# Patient Record
Sex: Female | Born: 1935 | Race: White | Hispanic: No | Marital: Single | Smoking: Never smoker
Health system: Southern US, Community
[De-identification: ages and names within clinical notes are randomized; demographics above are authoritative.]

## PROBLEM LIST (undated history)

## (undated) DIAGNOSIS — Z8719 Personal history of other diseases of the digestive system: Secondary | ICD-10-CM

## (undated) DIAGNOSIS — K5730 Diverticulosis of large intestine without perforation or abscess without bleeding: Secondary | ICD-10-CM

## (undated) DIAGNOSIS — K2990 Gastroduodenitis, unspecified, without bleeding: Secondary | ICD-10-CM

## (undated) DIAGNOSIS — M7120 Synovial cyst of popliteal space [Baker], unspecified knee: Secondary | ICD-10-CM

## (undated) DIAGNOSIS — G250 Essential tremor: Secondary | ICD-10-CM

## (undated) DIAGNOSIS — I2510 Atherosclerotic heart disease of native coronary artery without angina pectoris: Secondary | ICD-10-CM

## (undated) DIAGNOSIS — I10 Essential (primary) hypertension: Secondary | ICD-10-CM

## (undated) DIAGNOSIS — G459 Transient cerebral ischemic attack, unspecified: Secondary | ICD-10-CM

## (undated) DIAGNOSIS — K222 Esophageal obstruction: Secondary | ICD-10-CM

## (undated) DIAGNOSIS — K559 Vascular disorder of intestine, unspecified: Secondary | ICD-10-CM

## (undated) DIAGNOSIS — C541 Malignant neoplasm of endometrium: Secondary | ICD-10-CM

## (undated) DIAGNOSIS — R897 Abnormal histological findings in specimens from other organs, systems and tissues: Secondary | ICD-10-CM

## (undated) DIAGNOSIS — C44311 Basal cell carcinoma of skin of nose: Secondary | ICD-10-CM

## (undated) DIAGNOSIS — E7800 Pure hypercholesterolemia, unspecified: Secondary | ICD-10-CM

## (undated) DIAGNOSIS — Z87442 Personal history of urinary calculi: Secondary | ICD-10-CM

## (undated) DIAGNOSIS — I8390 Asymptomatic varicose veins of unspecified lower extremity: Secondary | ICD-10-CM

## (undated) DIAGNOSIS — E669 Obesity, unspecified: Secondary | ICD-10-CM

## (undated) DIAGNOSIS — Q999 Chromosomal abnormality, unspecified: Secondary | ICD-10-CM

## (undated) DIAGNOSIS — M1990 Unspecified osteoarthritis, unspecified site: Secondary | ICD-10-CM

## (undated) HISTORY — DX: Esophageal obstruction: K22.2

## (undated) HISTORY — PX: COLONOSCOPY: SHX174

## (undated) HISTORY — DX: Pure hypercholesterolemia, unspecified: E78.00

## (undated) HISTORY — DX: Basal cell carcinoma of skin of nose: C44.311

## (undated) HISTORY — DX: Vascular disorder of intestine, unspecified: K55.9

## (undated) HISTORY — PX: CATARACT EXTRACTION W/ INTRAOCULAR LENS  IMPLANT, BILATERAL: SHX1307

## (undated) HISTORY — DX: Personal history of urinary calculi: Z87.442

## (undated) HISTORY — PX: SKIN BIOPSY: SHX1

## (undated) HISTORY — PX: WISDOM TOOTH EXTRACTION: SHX21

## (undated) HISTORY — DX: Transient cerebral ischemic attack, unspecified: G45.9

## (undated) HISTORY — PX: CHOLECYSTECTOMY: SHX55

## (undated) HISTORY — DX: Chromosomal abnormality, unspecified: Q99.9

## (undated) HISTORY — DX: Obesity, unspecified: E66.9

## (undated) HISTORY — DX: Abnormal histological findings in specimens from other organs, systems and tissues: R89.7

## (undated) HISTORY — DX: Essential tremor: G25.0

## (undated) HISTORY — PX: EYE SURGERY: SHX253

## (undated) HISTORY — DX: Personal history of other diseases of the digestive system: Z87.19

## (undated) HISTORY — DX: Atherosclerotic heart disease of native coronary artery without angina pectoris: I25.10

## (undated) HISTORY — DX: Unspecified osteoarthritis, unspecified site: M19.90

---
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
## 1996-04-10 HISTORY — PX: LUMBAR DISC SURGERY: SHX700

## 1999-03-21 ENCOUNTER — Encounter: Payer: Self-pay | Admitting: Otolaryngology

## 1999-03-21 ENCOUNTER — Encounter: Admission: RE | Admit: 1999-03-21 | Discharge: 1999-03-21 | Payer: Self-pay | Admitting: Otolaryngology

## 1999-03-22 ENCOUNTER — Ambulatory Visit (HOSPITAL_BASED_OUTPATIENT_CLINIC_OR_DEPARTMENT_OTHER): Admission: RE | Admit: 1999-03-22 | Discharge: 1999-03-22 | Payer: Self-pay | Admitting: Otolaryngology

## 1999-08-19 ENCOUNTER — Encounter: Payer: Self-pay | Admitting: Gastroenterology

## 1999-08-19 ENCOUNTER — Ambulatory Visit (HOSPITAL_COMMUNITY): Admission: RE | Admit: 1999-08-19 | Discharge: 1999-08-19 | Payer: Self-pay | Admitting: Gastroenterology

## 1999-08-25 ENCOUNTER — Encounter (INDEPENDENT_AMBULATORY_CARE_PROVIDER_SITE_OTHER): Payer: Self-pay | Admitting: Specialist

## 1999-08-25 ENCOUNTER — Ambulatory Visit (HOSPITAL_COMMUNITY): Admission: RE | Admit: 1999-08-25 | Discharge: 1999-08-25 | Payer: Self-pay | Admitting: Gastroenterology

## 2000-04-10 HISTORY — PX: CHOLECYSTECTOMY, LAPAROSCOPIC: SHX56

## 2001-01-22 ENCOUNTER — Ambulatory Visit (HOSPITAL_COMMUNITY): Admission: RE | Admit: 2001-01-22 | Discharge: 2001-01-22 | Payer: Self-pay | Admitting: Internal Medicine

## 2001-01-22 ENCOUNTER — Encounter: Payer: Self-pay | Admitting: Internal Medicine

## 2001-02-08 ENCOUNTER — Observation Stay (HOSPITAL_COMMUNITY): Admission: RE | Admit: 2001-02-08 | Discharge: 2001-02-09 | Payer: Self-pay | Admitting: *Deleted

## 2001-02-08 ENCOUNTER — Encounter (INDEPENDENT_AMBULATORY_CARE_PROVIDER_SITE_OTHER): Payer: Self-pay | Admitting: Specialist

## 2001-04-10 HISTORY — PX: TENDON GRAFT: SHX2486

## 2001-04-10 HISTORY — PX: ROTATOR CUFF REPAIR: SHX139

## 2001-06-15 ENCOUNTER — Emergency Department (HOSPITAL_COMMUNITY): Admission: EM | Admit: 2001-06-15 | Discharge: 2001-06-15 | Payer: Self-pay | Admitting: Emergency Medicine

## 2001-06-15 ENCOUNTER — Encounter: Payer: Self-pay | Admitting: Emergency Medicine

## 2001-09-13 ENCOUNTER — Inpatient Hospital Stay (HOSPITAL_COMMUNITY): Admission: RE | Admit: 2001-09-13 | Discharge: 2001-09-15 | Payer: Self-pay | Admitting: Orthopedic Surgery

## 2003-03-12 ENCOUNTER — Ambulatory Visit (HOSPITAL_COMMUNITY): Admission: RE | Admit: 2003-03-12 | Discharge: 2003-03-12 | Payer: Self-pay | Admitting: Gastroenterology

## 2003-04-11 HISTORY — PX: CATARACT EXTRACTION: SUR2

## 2003-07-23 ENCOUNTER — Encounter (INDEPENDENT_AMBULATORY_CARE_PROVIDER_SITE_OTHER): Payer: Self-pay | Admitting: *Deleted

## 2003-07-23 ENCOUNTER — Ambulatory Visit (HOSPITAL_COMMUNITY): Admission: RE | Admit: 2003-07-23 | Discharge: 2003-07-23 | Payer: Self-pay | Admitting: Otolaryngology

## 2006-04-19 ENCOUNTER — Ambulatory Visit (HOSPITAL_COMMUNITY): Admission: RE | Admit: 2006-04-19 | Discharge: 2006-04-19 | Payer: Self-pay | Admitting: Internal Medicine

## 2007-06-13 ENCOUNTER — Ambulatory Visit: Payer: Self-pay | Admitting: Gastroenterology

## 2007-07-03 ENCOUNTER — Ambulatory Visit: Payer: Self-pay | Admitting: Gastroenterology

## 2007-07-23 ENCOUNTER — Ambulatory Visit: Payer: Self-pay | Admitting: Gastroenterology

## 2007-09-09 ENCOUNTER — Encounter: Payer: Self-pay | Admitting: Gastroenterology

## 2009-03-03 ENCOUNTER — Ambulatory Visit (HOSPITAL_COMMUNITY): Admission: RE | Admit: 2009-03-03 | Discharge: 2009-03-04 | Payer: Self-pay | Admitting: Orthopedic Surgery

## 2009-04-13 ENCOUNTER — Encounter: Admission: RE | Admit: 2009-04-13 | Discharge: 2009-07-12 | Payer: Self-pay | Admitting: Orthopedic Surgery

## 2009-06-23 ENCOUNTER — Ambulatory Visit: Payer: Self-pay | Admitting: Vascular Surgery

## 2009-12-27 ENCOUNTER — Emergency Department (HOSPITAL_COMMUNITY): Admission: EM | Admit: 2009-12-27 | Discharge: 2009-12-27 | Payer: Self-pay | Admitting: Emergency Medicine

## 2009-12-27 ENCOUNTER — Encounter (INDEPENDENT_AMBULATORY_CARE_PROVIDER_SITE_OTHER): Payer: Self-pay | Admitting: Emergency Medicine

## 2009-12-27 ENCOUNTER — Ambulatory Visit: Payer: Self-pay | Admitting: Surgery

## 2010-06-23 LAB — DIFFERENTIAL
Basophils Absolute: 0 10*3/uL (ref 0.0–0.1)
Basophils Relative: 0 % (ref 0–1)
Eosinophils Absolute: 0.1 10*3/uL (ref 0.0–0.7)
Eosinophils Relative: 1 % (ref 0–5)
Lymphocytes Relative: 24 % (ref 12–46)
Lymphs Abs: 1.4 10*3/uL (ref 0.7–4.0)
Monocytes Absolute: 0.5 10*3/uL (ref 0.1–1.0)
Monocytes Relative: 9 % (ref 3–12)
Neutro Abs: 3.9 10*3/uL (ref 1.7–7.7)
Neutrophils Relative %: 66 % (ref 43–77)

## 2010-06-23 LAB — POCT I-STAT, CHEM 8
BUN: 27 mg/dL — ABNORMAL HIGH (ref 6–23)
Calcium, Ion: 1.22 mmol/L (ref 1.12–1.32)
Chloride: 108 mEq/L (ref 96–112)
Creatinine, Ser: 1.1 mg/dL (ref 0.4–1.2)
Glucose, Bld: 86 mg/dL (ref 70–99)
HCT: 38 % (ref 36.0–46.0)
Hemoglobin: 12.9 g/dL (ref 12.0–15.0)
Potassium: 4.4 mEq/L (ref 3.5–5.1)
Sodium: 140 mEq/L (ref 135–145)
TCO2: 25 mmol/L (ref 0–100)

## 2010-06-23 LAB — CBC
HCT: 35.4 % — ABNORMAL LOW (ref 36.0–46.0)
Hemoglobin: 12 g/dL (ref 12.0–15.0)
MCH: 30.6 pg (ref 26.0–34.0)
MCHC: 34 g/dL (ref 30.0–36.0)
MCV: 89.9 fL (ref 78.0–100.0)
Platelets: 224 10*3/uL (ref 150–400)
RBC: 3.94 MIL/uL (ref 3.87–5.11)
RDW: 13.2 % (ref 11.5–15.5)
WBC: 5.9 10*3/uL (ref 4.0–10.5)

## 2010-06-23 LAB — COMPREHENSIVE METABOLIC PANEL
ALT: 12 U/L (ref 0–35)
AST: 20 U/L (ref 0–37)
Albumin: 4 g/dL (ref 3.5–5.2)
Alkaline Phosphatase: 49 U/L (ref 39–117)
BUN: 24 mg/dL — ABNORMAL HIGH (ref 6–23)
CO2: 26 mEq/L (ref 19–32)
Calcium: 9.6 mg/dL (ref 8.4–10.5)
Chloride: 108 mEq/L (ref 96–112)
Creatinine, Ser: 1.15 mg/dL (ref 0.4–1.2)
GFR calc Af Amer: 56 mL/min — ABNORMAL LOW (ref >60)
GFR calc non Af Amer: 46 mL/min — ABNORMAL LOW (ref >60)
Glucose, Bld: 87 mg/dL (ref 70–99)
Potassium: 4.4 mEq/L (ref 3.5–5.1)
Sodium: 140 mEq/L (ref 135–145)
Total Bilirubin: 0.6 mg/dL (ref 0.3–1.2)
Total Protein: 7.1 g/dL (ref 6.0–8.3)

## 2010-07-13 LAB — DIFFERENTIAL
Basophils Absolute: 0 10*3/uL (ref 0.0–0.1)
Basophils Relative: 1 % (ref 0–1)
Eosinophils Absolute: 0.1 10*3/uL (ref 0.0–0.7)
Eosinophils Relative: 2 % (ref 0–5)
Lymphocytes Relative: 32 % (ref 12–46)
Lymphs Abs: 1.7 10*3/uL (ref 0.7–4.0)
Monocytes Absolute: 0.5 10*3/uL (ref 0.1–1.0)
Monocytes Relative: 10 % (ref 3–12)
Neutro Abs: 2.9 10*3/uL (ref 1.7–7.7)
Neutrophils Relative %: 56 % (ref 43–77)

## 2010-07-13 LAB — URINALYSIS, ROUTINE W REFLEX MICROSCOPIC
Bilirubin Urine: NEGATIVE
Glucose, UA: NEGATIVE mg/dL
Hgb urine dipstick: NEGATIVE
Ketones, ur: NEGATIVE mg/dL
Nitrite: NEGATIVE
Protein, ur: NEGATIVE mg/dL
Specific Gravity, Urine: 1.022 (ref 1.005–1.030)
Urobilinogen, UA: 0.2 mg/dL (ref 0.0–1.0)
pH: 5.5 (ref 5.0–8.0)

## 2010-07-13 LAB — PROTIME-INR
INR: 1 (ref 0.00–1.49)
Prothrombin Time: 13.1 seconds (ref 11.6–15.2)

## 2010-07-13 LAB — COMPREHENSIVE METABOLIC PANEL
ALT: 15 U/L (ref 0–35)
AST: 20 U/L (ref 0–37)
Albumin: 3.8 g/dL (ref 3.5–5.2)
Alkaline Phosphatase: 53 U/L (ref 39–117)
BUN: 30 mg/dL — ABNORMAL HIGH (ref 6–23)
CO2: 29 mEq/L (ref 19–32)
Calcium: 9.9 mg/dL (ref 8.4–10.5)
Chloride: 104 mEq/L (ref 96–112)
Creatinine, Ser: 1.26 mg/dL — ABNORMAL HIGH (ref 0.4–1.2)
GFR calc Af Amer: 50 mL/min — ABNORMAL LOW (ref >60)
GFR calc non Af Amer: 42 mL/min — ABNORMAL LOW (ref >60)
Glucose, Bld: 84 mg/dL (ref 70–99)
Potassium: 4.9 mEq/L (ref 3.5–5.1)
Sodium: 140 mEq/L (ref 135–145)
Total Bilirubin: 0.8 mg/dL (ref 0.3–1.2)
Total Protein: 7.5 g/dL (ref 6.0–8.3)

## 2010-07-13 LAB — APTT: aPTT: 29 seconds (ref 24–37)

## 2010-07-13 LAB — CBC
HCT: 40.8 % (ref 36.0–46.0)
Hemoglobin: 13.6 g/dL (ref 12.0–15.0)
MCHC: 33.3 g/dL (ref 30.0–36.0)
MCV: 90.9 fL (ref 78.0–100.0)
Platelets: 228 10*3/uL (ref 150–400)
RBC: 4.48 MIL/uL (ref 3.87–5.11)
RDW: 13.2 % (ref 11.5–15.5)
WBC: 5.2 10*3/uL (ref 4.0–10.5)

## 2010-07-13 LAB — URINE MICROSCOPIC-ADD ON

## 2010-08-23 NOTE — Procedures (Signed)
LOWER EXTREMITY VENOUS REFLUX EXAM   INDICATION:  Bilateral swelling, aching and bulging varicosities.   EXAM:  Using color-flow imaging and pulse Doppler spectral analysis,  bilateral common femoral, superficial femoral, popliteal, posterior  tibial, greater and lesser saphenous veins were evaluated.  There is  evidence suggesting deep venous insufficiency in bilateral lower  extremities.   The left saphenofemoral junction is not competent with reflux of >500  milliseconds. The left great saphenous vein is not competent with reflux  of  >500 milliseconds.   The bilateral proximal short saphenous veins demonstrate competency.   GSV Diameter (used if found to be incompetent only)                                            Right    Left  Proximal Greater Saphenous Vein           cm       0.96 cm  Proximal-to-mid-thigh                     cm       0.70 cm  Mid thigh                                 cm       0.55 cm  Mid-distal thigh                          cm       cm  Distal thigh                              cm       0.54 cm  Knee                                      cm       0.70 cm   IMPRESSION:  1. The left great saphenous vein reflux of >500 milliseconds is      identified with the caliber ranging from 0.70 cm to 0.96 cm knee to      groin.  2. The bilateral great saphenous veins are not aneurysmal.  3. Bilateral great saphenous veins are not tortuous.  4. The deep venous system is not competent with reflux of  >500      milliseconds.  5. Bilateral lesser saphenous veins are competent.  6. Bilateral gastrocnemius veins are incompetent with reflux of >500      milliseconds.  7. Incompetent perforator veins are identified and contributing to      varicosities, right gastroc incompetent perforator measuring 0.31      cm, left great saphenous vein mid calf incompetent perforator      measuring 0.25 cm, left gastroc incompetent perforator measuring      0.23  cm.          ___________________________________________  Rosetta Posner, M.D.   CJ/MEDQ  D:  06/23/2009  T:  06/23/2009  Job:  [id number]

## 2010-08-23 NOTE — Consult Note (Signed)
NEW PATIENT CONSULTATION   [name], [name]  DOB:  [birth year]                                       06/23/2009  [id number]   The patient presents today for left leg venous pathology.  She is a very  pleasant 77 year old white female who presents with progressive changes  of venous hypertension in her left leg and to a much lesser degree her  right leg.  She does not have any history of DVT.  She reports heaviness  and cramping over the area of the varicosities over her medial aspect of  her left calf.  She has not had any prior bleeding from these.  She does  not have any skin ulcerations.  She has not worn compression garments.  She does elevate her legs for the treatment of the pressure.  She has no  history of arterial insufficiency.   PAST MEDICAL HISTORY:  Her past history is significant for elevated  blood pressure and elevated cholesterol which are both under medical  control.  She does have a history of laparoscopic cholecystectomy in  2002, lumbar disk surgery in 2009, eye surgery 2008 and 2005 and rotator  cuff tendon graft in 2010.   FAMILY HISTORY:  Significant for premature death with cardiovascular  disease in a brother, otherwise negative.   SOCIAL HISTORY:  She is single.  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  She does not smoke or drink alcohol.   REVIEW OF SYSTEMS:  Negative for weight loss or weight gain.  Her weight  is 155 pounds.  She is 5 feet 2 inches tall.  CARDIAC:  Positive only for arrhythmias related to certain medications.  PULMONARY:  Negative.  GI:  Positive for hiatal hernia, reflux, constipation.  GU:  Notable for cyst on both kidneys.  VASCULAR:  Significant for prior transient ischemic attack.  NEUROLOGICAL:  Negative otherwise.  MUSCULOSKELETAL:  Positive for osteoarthritis, joint pain, muscle pain.  PSYCHIATRIC:  Negative for depression or anxiety.  HEENT:  Positive for change in her hearing.  HEMATOLOGIC:  Negative for bleeding problems.  SKIN:  Without ulcers or rashes.   PHYSICAL EXAM:  General:  A well-developed, well-nourished white female  appearing her stated age.  Vital signs:  Blood pressure is 146/80, pulse  82, respirations 18.  HEENT:  Normal.  Musculoskeletal:  Shows no major  deformities or cyanosis.  Neurological:  Without focal weakness or  paresthesias.  Skin:  Without ulcers or rashes.  She does have 2+  radial, 2+ dorsalis pedis pulses bilaterally.  She has a few scattered  telangiectasia and small reticular varicosities in her right leg.  Over  her left leg she has a very large plexus of varicosities extending from  her medial knee across her calf.  She also has marked areas of patches  of telangiectasia around her ankles and distal calf.   She underwent noninvasive vascular laboratory studies in our office  today and I reviewed this with her.  Also reviewed notes from Dr.  Jacquiline Doe office.  Her noninvasive studies show mild deep venous reflux  bilaterally.  She does have no significant reflux in her right saphenous  vein great or small.  On the left she has marked reflux throughout her  left great saphenous vein extending into the varicosities in her calf  but  no reflux in the small saphenous.   I had a long discussion with the patient explaining the significance of  this.  I explained that the indications for treatment would be pain or  concern regarding appearance.  She is not concerned regarding the  appearance and does report that the pain is tolerable.  She was  reassured that this will be slowly progressive and if it does come to a  point where she is not willing to tolerated the level of discomfort this  could be treated.  I explained the option of compression garments.  She  has deferred this for now.  I also explained the potential option of  laser ablation of her left great saphenous vein and stab phlebectomy of  her tributary varicosities for  relief if conservative methods fail.  She  understands and will notify us should she wish to consider intervention  in the future.     Rosetta Posner, M.D.  Electronically Signed   TFE/MEDQ  D:  06/23/2009  T:  06/24/2009  Job:  [id number]

## 2010-08-23 NOTE — Assessment & Plan Note (Signed)
[place] OFFICE NOTE   NAME:Korell, ISHMEET CABACUNGAN                        MRN:          [id number]  DATE:07/23/2007                            DOB:          Aug 31, 1932    Mrs. Trejos underwent colonoscopy on July 03, 2007, and was found to have  rather severe diverticulosis, subacute diverticulitis, and a stricture  of her rectosigmoid that required some dilatation with pediatric  colonoscope.  She was treated with metronidazole and Flagyl,  is  currently asymptomatic and is following a high-fiber diet and is taking  fiber supplements and liberal p.o. fluids.   She also had endoscopic exam and had an esophageal stricture that was  partially dilated with the endoscope.  She refused full dilation with  [facility] dilators because of fear of perforation.  Additionally, she had  a 5 cm hiatal hernia and had nonspecific gastritis with a negative CLO  biopsy for Helicobacter pylori.  She currently is on Nexium 40 mg a day  and denies acid reflux or significant dysphagia.   VITAL SIGNS:  All stable.  General physical exam is not repeated.   ASSESSMENT:  Mrs. Fingar is doing well with her diverticulosis and acid  reflux disease.  She is at high risk for diverticulosis, and I have  asked her to be careful about her diet and take plenty of p.o. fluid  supplementation.   RECOMMENDATIONS:  1. High-fiber diet with Benefiber 1 tablespoon in juice every morning.  2. Continue reflux regime and daily Nexium therapy.  3. Continue other medications as per Dr. Reynaldo Minium which include Diovan      80 mg  every two to three days.  4. P.r.n. GI follow up as needed.     Loralee Pacas. Sharlett Iles, MD, Quentin Ore, [place]  Electronically Signed    DRP/MedQ  DD: 07/23/2007  DT: 07/23/2007  Job #: [id number]   cc:   Burnard Bunting, M.D.

## 2010-08-23 NOTE — Assessment & Plan Note (Signed)
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  She also has chronic GERD.  She had previous  endoscopies many years ago, and is currently having reflux symptoms,  especially at nighttime.  She is referred by Dr. Reynaldo Minium for possible  endoscopy and colonoscopy.   [name] does well with her acid reflux unless she uses carbonated  drinks.  She is status post cholecystectomy, denies any hepatobiliary  complaints.  Appetite is good and her weight is stable.  She has  irritable bowel syndrome with some gas, bloating, and irregular bowel  habits, but denies melena or hematochezia.  She has not had endoscopic  exams in many years.  Her family history is remarkable for colon cancer  in an uncle, an aunt, and a sister.  She denies specific food  intolerances, anorexia or weight loss.  She did have recent ultrasound  of the abdomen performed in February of this year because of her vague  abdominal pain, and this was unremarkable.   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  They have been excised by Dr. Minna Merritts.  She suffers from essential hypertension, hypercholesterolemia, and mild  degenerative arthritis.  Her cholecystectomy was performed in 2002.  She  also has chronic disk disease of her low back, and has chronic back  pain, but denies abuse of NSAIDs.   MEDICATIONS:  Diovan 80 mg 1 every 3 days.   She has had reactions in the past to ASPIRIN, VIOXX, PENICILLIN, and  SULFA.   FAMILY HISTORY:   Besides the above-mentioned colon carcinoma in several  members of their family, history is remarkable for atherosclerotic  cardiovascular disease and diabetes.   SOCIAL HISTORY:  The patient is single, has never been married, and  lives with her sister.  She has a high school education, has been  retired for 10 years.  She does not smoke or abuse ethanol.   REVIEW OF SYSTEMS:  Fairly noncontributory without active  cardiovascular, pulmonary, or other general medical problems.   Recent laboratory data:  She had a normal CBC with a hemoglobin of 13.0  and a normal metabolic profile.   PHYSICAL EXAMINATION:  She is a healthy-appearing white female,  appearing younger than her stated age, in no acute distress.  She is 5 feet 3 inches and weighs 176 pounds.  Blood pressure is 138/78,  and pulse was 72 and regular.  I could not appreciate stigmata of chronic liver disease or thyromegaly.  Her chest was entirely clear and she was in a regular rhythm without  murmurs, gallops, or rubs.  Her abdomen showed no organomegaly,  masses, or tenderness.  Bowel sounds  were normal.  Peripheral extremities were unremarkable.  Mental status was clear.  Rectal exam was deferred.   ASSESSMENT:  1. Strong family history of colon cancer in a 77 year old white female      who has not had colonoscopy in approximately 10 years.  She has      chronic irritable bowel syndrome, but no history of rectal bleeding      or anemia.  2. Status post cholecystectomy for cholelithiasis.  3. Chronic gastroesophageal reflux disease, managed by dietary      therapy.  4. Mild essential hypertension.  5. History of hypercholesterolemia, managed by dietary therapy.  6. History of TIA several years ago without overt stroke.  7. History of multiple basal and squamous cell carcinomas of the face,      excised.  8. History of PENICILLIN and SULFA allergies.  9. History of NON-STEROIDAL ANTI-INFLAMMATORY DRUG sensitivity.   10.History of cataract surgery in 2005.   RECOMMENDATIONS:  1. Reflux regimen, reviewed with patient and will try a Kapidex 60 mg      30 minutes before breakfast.  2. Continue high-fiber diet as tolerated.  3. Outpatient endoscopy and colonoscopy at her convenience.  4. Continue on the medications per Dr. Reynaldo Minium.     Loralee Pacas. Sharlett Iles, MD, Quentin Ore, [place]  Electronically Signed    DRP/MedQ  DD: 06/13/2007  DT: 06/13/2007  Job #: [id number]   cc:   Burnard Bunting, M.D.

## 2010-08-26 NOTE — Op Note (Signed)
[facility]  Patient:    [name]                         MRN: [id number] Proc. Date: 03/22/99 Adm. Date:  [id number] Attending:  Shirlyn Goltz CC:         Orlando Penner. London Pepper, M.D.             Dr. 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  POSTOPERATIVE DIAGNOSIS:  Basal cell carcinoma of the nasal dorsum.  OPERATION:  Excision of basal cell carcinoma with advancement flap reconstruction. Size of lesion was 1.3 x 1.1 cm.  SURGEON:  Thornell Sartorius, M.D.  ANESTHESIA:  Local with general standby.  MAC.  ANESTHESIOLOGIST:  Ala Dach, M.D.  DESCRIPTION OF PROCEDURE:  The patient was placed in the supine position under local anesthesia.  She was prepped and draped in the appropriate manner using Hibiclens x 3 and the usual head drape.  Then a marking pen was used.  Then 1% Xylocaine with epinephrine was injected appropriately.  The lesion was excised in a somewhat circular manner appropriately gearing towards the flap that was previously done as we would have to look under the flap for further evaluation f the basal cell, and then we will advance this flap, which appears to be somewhat full and bulky, in an effort to correct this present lesion.  The incisions made make it very difficult to develop a new flap.  Once the lesion was removed totally and all electrocoagulation was used for all hemostasis, then all instruments were set aside where that was used, and the new flap was established.  We made incisions encompassing the previous flap in some areas in an effort to save vascular flow to this flap.  Then we advanced the flap from the nasolabial region, and advanced t over to correct and cover the nasal defect.  Once all hemostasis was established, the flap was estimated in length, approximately 3 cm x 2 cm in diameter, and this was moved into  place without stress.  Closure was with chromic catgut 4-0 and then 5-0 Ethilon was used to close this flap.  Steri-Strips were applied.  All hemostasis was established.  A Band-Aid was placed over this also, and the patient was taken to the recovery room in good condition.  Follow up will be in one week, three weeks, six weeks, six months, and then a year. DD:  03/22/99 TD:  03/22/99 Job: [id number]

## 2010-08-26 NOTE — H&P (Signed)
[facility]  Patient:    [name], [name] Visit Number: [id number] MRN: [id number]          Service Type: SUR Location: 1S 0001 01 Attending Physician:  [name] Dictated by:   [name], P.A. Admit Date:  09/13/2001                           History and Physical  CHIEF COMPLAINT:  Right shoulder pain.  HISTORY OF PRESENT ILLNESS:  The patient is a 77 year old white female who has been complaining of pain in her right shoulder after lifting her mother.  She has painful and limited range of motion of her right shoulder.  She is unable to reach behind her back.  She can abduct and full reflex her arm, but she is unable to hold against any pressure of her right shoulder.  She has talked with Dr. Gladstone [name] extensively about surgery and wishes to proceed with closed manipulation and open rotator cuff repair.  PAST MEDICAL HISTORY: 1. Gastroesophageal reflux disease. 2. Duodenal ulcers. 3. Hiatal hernia. 4. Kidney stones. 5. Basal cell carcinoma x 4 on her nose. 6. Osteoarthritis.  PAST SURGICAL HISTORY: 1. Cholecystectomy. 2. Tonsillectomy. 3. Lumbar disk surgery. 4. Flap on her nose from the basal cell carcinoma.  MEDICATIONS:  Aleve 200 mg p.r.n.  ALLERGIES:  PENICILLIN, SULFA, MEDROL DOSEPAK gives her rapid heartbeat and vomits.  SOCIAL HISTORY:  She is single, retired.  Lives in a one-level home.  She lives with her sister, and her sisters will be her caregivers after surgery.  FAMILY HISTORY:  Father, coronary artery disease, deceased.  Mother, 52 years old, with osteoarthritis, still living.  Sister, diabetes mellitus and hypertension.  Brothers, diabetes mellitus.  Uncle had colon and rectal cancer.  Different sister is deceased, also had colon and rectal cancer.  REVIEW OF SYSTEMS:  GENERAL:  Denies fevers, chills, night sweats, bleeding tendencies.  CENTRAL NERVOUS SYSTEM:  Denies blurred or double  vision, seizures, headaches, paralysis.  RESPIRATORY:  Denies shortness of breath, productive cough, hemoptysis.  CARDIOVASCULAR:  Denies chest pain, angina, orthopnea.  GASTROINTESTINAL:  Denies nausea, vomiting, diarrhea, constipation, melena, bloody stools.  GENITOURINARY:  Denies dysuria, hematuria, discharge.  MUSCULOSKELETAL:  As pertinent to HPI.  PHYSICAL EXAMINATION:  VITAL SIGNS:  Blood pressure 170/100.  The patient states that her blood pressure normally runs about 150/80.  Pulse 60, respirations 12.  GENERAL:  Well-developed, well-nourished 77 year old female in no apparent distress.  HEENT:  Normocephalic, atraumatic.  Pupils are equal, round, and reactive to light.  SKIN:  No carotid bruits.  CHEST:  Clear to auscultation bilaterally.  No wheezes or crackles.  HEART:  Regular rate and rhythm.  No murmurs, rubs, or gallops.  ABDOMEN:  Soft, nontender, nondistended.  Positive bowel sounds x 4 quadrants.  EXTREMITIES:  Painful limited range of motion of the shoulder.  She is unable to reach behind her back.  SKIN:  No lesions or rashes.  NEUROLOGIC:  Alert and oriented x3.  LABORATORY DATA:  MRI revealed small full-thickness tear posterior aspect of distal infraspinatus tendon.  Subacromial and subdeltoid bursitis.  Severe glenohumeral joint degeneration.  Arthritis.  ______ degeneration and tearing.  Severe degenerative arthritis of the [facility] joint with a configuration that predisposes to impingement.  IMPRESSION: 1. Small full-thickness tear of posterior aspect of distal infraspinatus    tendon. 2. Gastroesophageal reflux disease. 3. Ulcers. 4. Hiatal hernia. 5. Kidney stones. 6.  Basal cell carcinoma. 7. Osteoarthritis.  PLAN:  Closed manipulation under general anesthesia, then open right rotator cuff repair. Dictated by:   [name], P.A. Attending Physician:  [name] DD:  09/10/01 TD:  09/11/01 Job: [id number]

## 2010-08-26 NOTE — Op Note (Signed)
[facility]  Patient:    [name], [name] Visit Number: [id number] MRN: [id number]          Service Type: SUR Location: 4W [id number] Attending Physician:  Ferrel Logan Dictated by:   Kipp Brood Gladstone Lighter, M.D. Proc. Date: 09/13/01 Admit Date:  09/13/2001                             Operative Report  SURGEON:  Jori Moll A. Gladstone Lighter, M.D.  ASSISTANTElodia Florence. Underwood III, P.A.-C.  PREOPERATIVE DIAGNOSES: 1. Torn rotator cuff tendon of the right shoulder. 2. Severe impingement syndrome, right shoulder. 3. Frozen shoulder, right shoulder.  POSTOPERATIVE DIAGNOSES: 1. Torn rotator cuff tendon of the right shoulder. 2. Severe impingement syndrome, right shoulder. 3. Frozen shoulder, right shoulder.  OPERATION: 1. A closed manipulation under general anesthesia of the right shoulder. 2. Open decompression by performing partial acromionectomy, right shoulder. 3. Graft jacket type tendon graft to the rotator cuff for rotator cuff repair,    right shoulder.  DESCRIPTION OF PROCEDURE:  Under general anesthesia, I first did a closed manipulation under general anesthesia in the right shoulder; then a sterile prep and draping was carried out.  At this time an incision was made over the anterior aspect of the right shoulder.  Bleeders were identified and cauterized.  I then stripped the deltoid tendon and muscle from the acromion. Following that, the self-retaining retractors were inserted.  Immediately upon going down into the bursa, there was a voluminous amount of fluid that came out through the area.  I did a bursectomy, removed the subdeltoid bursa, then noted she had severe improvement syndrome.  I utilized the oscillating saw, protected the cuff and did a partial acromionectomy.  I then utilized the bur to even out the undersurface of the acromion.  Following that, I noted there was a very large hole, and the tendon was severe abraded.  This hole  was mainly located over the long head of the biceps.  The biceps was intact.  I thoroughly irrigated out this area and applied a graft jacket to the rotator cuff for repair purposes.  Once this was secured in place, I then reapproximated the deltoid tendon in the muscle in the usual fashion.  The main part of the wound was closed with 0 Vicryl, skin with metal staples.  A sterile Neosporin was applied.  She was placed in a shoulder immobilizer. Dictated by:   Kipp Brood Gladstone Lighter, M.D. Attending Physician:  Ferrel Logan DD:  09/13/01 TD:  09/15/01 Job: [id number]

## 2010-08-26 NOTE — Op Note (Signed)
NAMEOMARI, [name]                           ACCOUNT NO.:  [id number]   MEDICAL RECORD NO.:  [id number]                   PATIENT TYPE:  OIB   LOCATION:  2899                                 FACILITY:  [facility]   PHYSICIAN:  Minna Merritts, M.D.                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  She now  enters my office with a history of a further mass on her right nasal tip  and, also, something of concern on the cupids bow area of her lip, on the  right more than the left, but involving both sides.  I have shave biopsied  the nose which is a basal cell tumor and we will do a frozen section to see  what the lip lesion is showing.   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   PROCEDURE:  1. Resection of the basal cell tumor of the right lobe with a bilobed     bipedicle flap reconstruction of this defect.  2. Excision of the squamous cell tumor of the cupids bow involving the     portion of the upper lip and the skin of the upper lip 3 cm in length     with a primary closure.   SURGEON:  Minna Merritts, M.D.   ANESTHESIA:  Local with MAC, 1% Xylocaine with epinephrine.   DESCRIPTION OF PROCEDURE:  The patient was placed in the supine position.  Under local anesthesia, was prepped and draped in the usual manner using the  usual facial prep.  The usual head drape was used.  1% Xylocaine was  injected and the marking pen was used to mark the planned  excision and flap  reconstruction.  We excised a circular area of the basal cell tumor, sent  that for frozen section, and did have positive margins.  We then resected  the entire edges of the lesion taking the resection diameter to 2 cm.  The  margins were felt to be clear but fairly close.  We then used a bipedicle  flap from the nasal cheek groove to bring this over to the nasal tip and  sutured that in place using 5-0 plain catgut and 5-0 Ethilon.  We then  replaced the area of the nasal cheek groove by closing this primarily and  rotating a second lobe of a bilobed flap into the donor site of the first  flap.  This rotated in very well and, again, was closed with 5-0 Ethilon.  Hemostasis was established with Bovie electrocoagulation.  Once this was  closed, Steri-Strips were applied.   The lip had been previously biopsied and a squamous cell carcinoma was  diagnosed on frozen section.  We excised this under local anesthesia  removing a portion of the upper lip trying to maintain the contour of the  cupids bow and also removing a portion of skin and doing this in a  symmetrical way maintaining the cupids bow.  Once we did this, after  hemostasis was established, we closed this with 5-0 plain catgut and 5-0  Ethilon.  Steri-Strips were also applied here and the patient was taken to  the recovery room in good condition.   Follow up will be in my office in three days, one week, two weeks, three  weeks, six weeks, three months, six months, and a year.                                               Minna Merritts, M.D.    JC/MEDQ  D:  07/23/2003  T:  07/23/2003  Job:  [id number]   cc:   Burnard Bunting, M.D.  [address]  [place]  [place] [address]  Fax: [phone]

## 2010-08-26 NOTE — H&P (Signed)
[name], [name]                           ACCOUNT NO.:  [id number]   MEDICAL RECORD NO.:  [id number]                   PATIENT TYPE:  OIB   LOCATION:  2899                                 FACILITY:  [facility]   PHYSICIAN:  Minna Merritts, M.D.                DATE OF BIRTH:  May 16, 1932   DATE OF ADMISSION:  07/23/2003  DATE OF DISCHARGE:                                HISTORY & PHYSICAL   HISTORY OF PRESENT ILLNESS:  This patient is a 77 year old female who has  had a previous lesion removed from her left nose in 2000 under my care which  was a basal cell tumor and it showed no problem.  She now enters with a  lesion on her right nose which we had previously biopsied and found to be  squamous cell carcinoma.  She also has a lesion on her upper lip which we  will biopsy in the operating room to see what this shows, but is highly  suspicious for tumor.  She now enters for a flap reconstruction after  excision of a right nasal tip lesion approximately 2 cm in size and a  possible excision of a squamous or basal cell tumor of the right and left  upper lip at the cupids bow region.   PAST MEDICAL HISTORY:  Remarkable in that she is on Diovan. She has had a  T&A in the past.  She has had laparoscopic cholecystectomy.  She has had a  rotator cuff repair.  The basal cell tumor of the left nose, ruptured tendon  of her left foot, and she has had L4-5 diskectomy. She fell on her right arm  and she has had tingling in both hands since that time.  She had a hiatal  hernia.  She had some arthritis, cataract potential history.   ALLERGIES:  PENICILLIN, VIOXX, SULFA, ALEVE.   PHYSICAL EXAMINATION:  VITAL SIGNS:  Blood pressure 158/85, she is 5 foot 1  inch, and weight 186.  Pulse 56.  HEENT:  Ears clear, oral cavity clear.  Nose shows lesion on the nasal tip  which is raised and irregular and has been diagnosed as a basal cell tumor.  On the upper lip she also has a lesion which is primarily on  the right, but  also on the left which we will biopsy and frozen section in the OR.  NECK:  Free of any thyromegaly, cervical adenopathy, or mass. No salivary  gland abnormalities.  Lips, teeth, and gums are within normal limits.  Larynx is clear, true cords, false cords, epiglottis, base of tongue are  clear.  CHEST:  Clear.  No rales, rhonchi, or wheezes.  HEART:  No murmurs, rubs, or gallops.  ABDOMEN:  Free of any organomegaly, tenderness, or mass, mildly obese.   EKG is sinus bradycardia.  Chest x-ray pending.   ADMISSION DIAGNOSES:  1. Basal cell carcinoma of the  right nasal tip.  2. 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  3. Basal cell tumor of left nose in 2000.  4. History of laparoscopic cholecystectomy.  5. History of rotator cuff repair right.  6. History of left foot surgery and L4-5 diskectomy.  7. History of tonsillectomy and adenoidectomy.  8. Penicillin, Vioxx, sulfa, and Aleve allergies.                                                Minna Merritts, M.D.    JC/MEDQ  D:  07/23/2003  T:  07/23/2003  Job:  [id number]   cc:   Burnard Bunting, M.D.  [address]  [place]  [place] [address]  Fax: [phone]

## 2010-08-26 NOTE — Op Note (Signed)
[facility]  Patient:    [name], [name] Visit Number: [id number] MRN: [id number]          Service Type: SUR Location: 4W [id number] Attending Physician:  Darrelyn Hillock Proc. Date: 02/08/01 Admit Date:  02/08/2001 Discharge Date: 02/09/2001   CC:         Richard A. Reynaldo Minium, M.D.                           Operative Report  PREOPERATIVE DIAGNOSIS:  Symptomatic cholelithiasis.  POSTOPERATIVE DIAGNOSIS:  Symptomatic cholelithiasis.  PROCEDURE:  Laparoscopic cholecystectomy.  SURGEON:  Vernona Rieger, M.D.  ASSISTANT:  Abbott Pao. March Rummage, M.D.  ANESTHESIA:  General endotracheal tube.  INDICATIONS AND CONSENT:  The patient is a 77 year old white female, with a 2-3 week history of worsening right upper quadrant and epigastric abdominal pain.  A work-up showed multiple gallstones, normal liver function tests, normal common bile duct diameter.  The patient was tender to my exam preoperatively, and therefore, she was scheduled for laparoscopic cholecystectomy.  The indication, risks, benefits, and options were explained to the patient, and informed consent was granted.  DESCRIPTION OF PROCEDURE:  The patient was taken to the operating room and placed in the supine position.  After adequate anesthesia was induced using endotracheal tube, the abdomen was prepped and draped in the normal sterile fashion.  Using a transverse infraumbilical incision, I dissected down to the fascia.  The fascia was opened vertically, and an 0 Vicryl pursestring suture was placed around the fascial defect.  A Hasson trocar was placed in the abdomen, and the abdomen was insufflated with carbon dioxide to 50 mmHg. Under direct visualization, a 10 mm port was placed in the subxiphoid region. Two 5 mm ports were placed in the right abdomen.  The gallbladder was identified and retracted cephalad.  A number of filmy adhesions were taken down from the lateral wall of the  gallbladder.  The neck of the gallbladder was identified.  Infundibulum was retracted laterally.  The cystic duct was dissected free of surrounding structures.  Its junction with the gallbladder and the common duct was identified.  The cystic duct was triply clipped and divided.  The cystic artery was identified, dissected free, triply clipped and divided.  The gallbladder was then taken off the gallbladder bed using Bovie electrocautery and removed through the umbilical port.  Adequate hemostasis was ensured.  The right upper quadrant was copiously irrigated.  All incisions were injected using Marcaine.  The abdomen was allowed to deflate.  The fascial defect was closed with an 0 Vicryl pursestring suture.  The skin incisions were closed with subcuticular 4-0 Monocryl.  Dermabond dressing was placed.  The patient tolerated the procedure well and went to PACU in good condition. Attending Physician:  Janeece Agee R. DD:  02/08/01 TD:  02/11/01 Job: [id number]

## 2010-10-10 ENCOUNTER — Emergency Department (HOSPITAL_COMMUNITY): Payer: Medicare Other

## 2010-10-10 ENCOUNTER — Emergency Department (HOSPITAL_COMMUNITY)
Admission: EM | Admit: 2010-10-10 | Discharge: 2010-10-10 | Disposition: A | Discharge status: Home / Self Care | Payer: Medicare Other | Attending: Emergency Medicine | Admitting: Emergency Medicine

## 2010-10-10 DIAGNOSIS — E7800 Pure hypercholesterolemia, unspecified: Secondary | ICD-10-CM | POA: Insufficient documentation

## 2010-10-10 DIAGNOSIS — M549 Dorsalgia, unspecified: Secondary | ICD-10-CM | POA: Insufficient documentation

## 2010-10-10 DIAGNOSIS — I10 Essential (primary) hypertension: Secondary | ICD-10-CM | POA: Insufficient documentation

## 2010-10-10 DIAGNOSIS — R1032 Left lower quadrant pain: Secondary | ICD-10-CM | POA: Insufficient documentation

## 2010-10-10 DIAGNOSIS — K5732 Diverticulitis of large intestine without perforation or abscess without bleeding: Secondary | ICD-10-CM | POA: Insufficient documentation

## 2010-10-10 DIAGNOSIS — Q613 Polycystic kidney, unspecified: Secondary | ICD-10-CM | POA: Insufficient documentation

## 2010-10-10 LAB — URINALYSIS, ROUTINE W REFLEX MICROSCOPIC
Bilirubin Urine: NEGATIVE
Glucose, UA: NEGATIVE mg/dL
Hgb urine dipstick: NEGATIVE
Ketones, ur: NEGATIVE mg/dL
Leukocytes, UA: NEGATIVE
Nitrite: NEGATIVE
Protein, ur: NEGATIVE mg/dL
Specific Gravity, Urine: 1.022 (ref 1.005–1.030)
Urobilinogen, UA: 0.2 mg/dL (ref 0.0–1.0)
pH: 5.5 (ref 5.0–8.0)

## 2010-10-10 LAB — DIFFERENTIAL
Basophils Absolute: 0 10*3/uL (ref 0.0–0.1)
Basophils Relative: 0 % (ref 0–1)
Eosinophils Absolute: 0 10*3/uL (ref 0.0–0.7)
Eosinophils Relative: 0 % (ref 0–5)
Lymphocytes Relative: 15 % (ref 12–46)
Lymphs Abs: 1.3 10*3/uL (ref 0.7–4.0)
Monocytes Absolute: 1 10*3/uL (ref 0.1–1.0)
Monocytes Relative: 11 % (ref 3–12)
Neutro Abs: 6.7 10*3/uL (ref 1.7–7.7)
Neutrophils Relative %: 74 % (ref 43–77)

## 2010-10-10 LAB — COMPREHENSIVE METABOLIC PANEL
ALT: 7 U/L (ref 0–35)
AST: 14 U/L (ref 0–37)
Albumin: 3.4 g/dL — ABNORMAL LOW (ref 3.5–5.2)
Alkaline Phosphatase: 54 U/L (ref 39–117)
BUN: 28 mg/dL — ABNORMAL HIGH (ref 6–23)
CO2: 25 mEq/L (ref 19–32)
Calcium: 9.7 mg/dL (ref 8.4–10.5)
Chloride: 104 mEq/L (ref 96–112)
Creatinine, Ser: 1.29 mg/dL — ABNORMAL HIGH (ref 0.50–1.10)
GFR calc Af Amer: 49 mL/min — ABNORMAL LOW (ref >60)
GFR calc non Af Amer: 40 mL/min — ABNORMAL LOW (ref >60)
Glucose, Bld: 102 mg/dL — ABNORMAL HIGH (ref 70–99)
Potassium: 4.2 mEq/L (ref 3.5–5.1)
Sodium: 138 mEq/L (ref 135–145)
Total Bilirubin: 0.5 mg/dL (ref 0.3–1.2)
Total Protein: 6.9 g/dL (ref 6.0–8.3)

## 2010-10-10 LAB — CBC
HCT: 35.5 % — ABNORMAL LOW (ref 36.0–46.0)
Hemoglobin: 12 g/dL (ref 12.0–15.0)
MCH: 29.3 pg (ref 26.0–34.0)
MCHC: 33.8 g/dL (ref 30.0–36.0)
MCV: 86.8 fL (ref 78.0–100.0)
Platelets: 256 10*3/uL (ref 150–400)
RBC: 4.09 MIL/uL (ref 3.87–5.11)
RDW: 13.4 % (ref 11.5–15.5)
WBC: 9.1 10*3/uL (ref 4.0–10.5)

## 2010-10-10 LAB — LIPASE, BLOOD: Lipase: 32 U/L (ref 11–59)

## 2011-04-24 ENCOUNTER — Encounter: Payer: Self-pay | Admitting: *Deleted

## 2011-04-26 ENCOUNTER — Ambulatory Visit (INDEPENDENT_AMBULATORY_CARE_PROVIDER_SITE_OTHER): Payer: Medicare Other | Admitting: Gastroenterology

## 2011-04-26 ENCOUNTER — Encounter: Payer: Self-pay | Admitting: Gastroenterology

## 2011-04-26 ENCOUNTER — Other Ambulatory Visit (INDEPENDENT_AMBULATORY_CARE_PROVIDER_SITE_OTHER): Payer: Medicare Other

## 2011-04-26 DIAGNOSIS — R109 Unspecified abdominal pain: Secondary | ICD-10-CM

## 2011-04-26 DIAGNOSIS — K5902 Outlet dysfunction constipation: Secondary | ICD-10-CM | POA: Insufficient documentation

## 2011-04-26 DIAGNOSIS — K219 Gastro-esophageal reflux disease without esophagitis: Secondary | ICD-10-CM

## 2011-04-26 DIAGNOSIS — K5730 Diverticulosis of large intestine without perforation or abscess without bleeding: Secondary | ICD-10-CM

## 2011-04-26 DIAGNOSIS — Z79899 Other long term (current) drug therapy: Secondary | ICD-10-CM

## 2011-04-26 LAB — IBC PANEL
Iron: 72 ug/dL (ref 42–145)
Saturation Ratios: 20.2 % (ref 20.0–50.0)

## 2011-04-26 LAB — HIGH SENSITIVITY CRP: CRP, High Sensitivity: 0.67 mg/L (ref 0.000–5.000)

## 2011-04-26 LAB — CBC WITH DIFFERENTIAL/PLATELET
Eosinophils Absolute: 0.1 10*3/uL (ref 0.0–0.7)
Eosinophils Relative: 1.8 % (ref 0.0–5.0)
HCT: 37.5 % (ref 36.0–46.0)
Lymphs Abs: 1.3 10*3/uL (ref 0.7–4.0)
MCHC: 33.6 g/dL (ref 30.0–36.0)
MCV: 90 fl (ref 78.0–100.0)
Monocytes Absolute: 0.6 10*3/uL (ref 0.1–1.0)
Neutrophils Relative %: 59 % (ref 43.0–77.0)
Platelets: 210 10*3/uL (ref 150.0–400.0)
WBC: 4.9 10*3/uL (ref 4.5–10.5)

## 2011-04-26 LAB — SEDIMENTATION RATE: Sed Rate: 24 mm/hr — ABNORMAL HIGH (ref 0–22)

## 2011-04-26 LAB — VITAMIN B12: Vitamin B-12: 320 pg/mL (ref 211–911)

## 2011-04-26 MED ORDER — LINACLOTIDE 145 MCG PO CAPS: 1.0000 | ORAL_CAPSULE | Freq: Every day | ORAL | Status: DC

## 2011-04-26 NOTE — Progress Notes (Signed)
This is a 78 year old Caucasian female with long history of recurrent diverticulitis, sigmoid colon stricture, and chronic GERD. She has chronic functional constipation and uses when necessary laxatives and enemas. Recently she's had increased gas and bloating associated with increased stress in her life related to several deaths of family members. Has multiple drug allergies, and in June of 2012 had acute diverticulitis confirmed by CT scan, and was able take one week of Cipro and metronidazole, but had to discontinue these medications because of leg swelling, especially her left leg where she has severe venous insufficiency. She denies fever, chills, upper GI or hepatobiliary complaints. She is status post cholecystectomy. There also is no history of melena, hematochezia, or severe anemia. For years she suffers from obesity, and has voluntarily lost some 50-60 pounds in weight over the last 2 years. She is unable to take fiber supplements because of  Aspartamine allergy. As colonoscopy in March of 2009 showed severe diverticulitis with a fixed area of sigmoid colon and chronic inflammation.   Current Medications, Allergies, Past Medical History, Past Surgical History, Family History and Social History were reviewed in [facility] record.  Pertinent Review of Systems Negative.. chronic edema of her left leg with some limitation of mobility. He is however able to walk one to 2 miles a day. Only medication at this time his Diovan 80 mg a day.   Physical Exam: Nontoxic appearing elderly female in no acute distress. I cannot appreciate stigmata of chronic liver disease. Chest is clear she appeared to be a regular rhythm without murmurs gallops or rubs. There is no abdominal distention, organomegaly, masses or tenderness. Inspection rectum is unremarkable as is rectal exam. There are no masses, tenderness, impaction, and stool guaiac negative. There marked varicosities of the left lower  extremity with trace edema but no evidence of phlebitis or cellulitis. Mental status is normal.    Assessment and Plan: Chronic diverticulosis with an element of sigmoid colon stricturing. I placed her on a high-fiber diet with liberal by mouth fluids, and we'll try Linzess 145 mcg a day for constipation. See her back in 2-3 weeks' time for followup. She may need followup colonoscopy exam. Prior endoscopy and evidence of a large hiatal hernia, but the patient denies significant acid reflux problems or dysphagia at this time. She is status post cholecystectomy. Review of her labs shows no specific abnormalities.  Encounter Diagnoses  Name Primary?  . Diverticulosis of colon (without mention of hemorrhage)   . Abdominal pain

## 2011-04-26 NOTE — Patient Instructions (Signed)
Take the samples of Linzess one tablet by mouth once a day on an empty stomach if they work well call back for a rx.  Please go to the basement today for your labs.  Follow the high fiber diet below.  Make office visit to follow up in 3 weeks with Dr Sharlett Iles.   High Fiber Diet A high fiber diet changes your normal diet to include more whole grains, legumes, fruits, and vegetables. Changes in the diet involve replacing refined carbohydrates with unrefined foods. The calorie level of the diet is essentially unchanged. The Dietary Reference Intake (recommended amount) for adult males is 38 g per day. For adult females, it is 25 g per day. Pregnant and lactating women should consume 28 g of fiber per day. Fiber is the intact part of a plant that is not broken down during digestion. Functional fiber is fiber that has been isolated from the plant to provide a beneficial effect in the body. PURPOSE  Increase stool bulk.   Ease and regulate bowel movements.   Lower cholesterol.  INDICATIONS THAT YOU NEED MORE FIBER  Constipation and hemorrhoids.   Uncomplicated diverticulosis (intestine condition) and irritable bowel syndrome.   Weight management.   As a protective measure against hardening of the arteries (atherosclerosis), diabetes, and cancer.  NOTE OF CAUTION If you have a digestive or bowel problem, ask your caregiver for advice before adding high fiber foods to your diet. Some of the following medical problems are such that a high fiber diet should not be used without consulting your caregiver:  Acute diverticulitis (intestine infection).   Partial small bowel obstructions.   Complicated diverticular disease involving bleeding, rupture (perforation), or abscess (boil, furuncle).   Presence of autonomic neuropathy (nerve damage) or gastric paresis (stomach cannot empty itself).  GUIDELINES FOR INCREASING FIBER  Start adding fiber to the diet slowly. A gradual increase of about 5  more grams (2 slices of whole-wheat bread, 2 servings of most fruits or vegetables, or 1 bowl of high fiber cereal) per day is best. Too rapid an increase in fiber may result in constipation, flatulence, and bloating.   Drink enough water and fluids to keep your urine clear or pale yellow. Water, juice, or caffeine-free drinks are recommended. Not drinking enough fluid may cause constipation.   Eat a variety of high fiber foods rather than one type of fiber.   Try to increase your intake of fiber through using high fiber foods rather than fiber pills or supplements that contain small amounts of fiber.   The goal is to change the types of food eaten. Do not supplement your present diet with high fiber foods, but replace foods in your present diet.  INCLUDE A VARIETY OF FIBER SOURCES  Replace refined and processed grains with whole grains, canned fruits with fresh fruits, and incorporate other fiber sources. White rice, white breads, and most bakery goods contain little or no fiber.   Brown whole-grain rice, buckwheat oats, and many fruits and vegetables are all good sources of fiber. These include: broccoli, Brussels sprouts, cabbage, cauliflower, beets, sweet potatoes, white potatoes (skin on), carrots, tomatoes, eggplant, squash, berries, fresh fruits, and dried fruits.   Cereals appear to be the richest source of fiber. Cereal fiber is found in whole grains and bran. Bran is the fiber-rich outer coat of cereal grain, which is largely removed in refining. In whole-grain cereals, the bran remains. In breakfast cereals, the largest amount of fiber is found in those with "bran"  in their names. The fiber content is sometimes indicated on the label.   You may need to include additional fruits and vegetables each day.   In baking, for 1 cup white flour, you may use the following substitutions:   1 cup whole-wheat flour minus 2 tbs.    cup white flour plus  cup whole-wheat flour.  Document  Released: 03/27/2005 Document Revised: 12/07/2010 Document Reviewed: 02/02/2009 [facility] Patient Information 2012 [place].  Diverticulitis A diverticulum is a small pouch or sac on the colon. Diverticulosis is the presence of these diverticula on the colon. Diverticulitis is the irritation (inflammation) or infection of diverticula. CAUSES  The colon and its diverticula contain bacteria. If food particles block the tiny opening to a diverticulum, the bacteria inside can grow and cause an increase in pressure. This leads to infection and inflammation and is called diverticulitis. SYMPTOMS   Abdominal pain and tenderness. Usually, the pain is located on the left side of your abdomen. However, it could be located elsewhere.   Fever.   Bloating.   Feeling sick to your stomach (nausea).   Throwing up (vomiting).   Abnormal stools.  DIAGNOSIS  Your caregiver will take a history and perform a physical exam. Since many things can cause abdominal pain, other tests may be necessary. Tests may include:  Blood tests.   Urine tests.   X-ray of the abdomen.   CT scan of the abdomen.  Sometimes, surgery is needed to determine if diverticulitis or other conditions are causing your symptoms. TREATMENT  Most of the time, you can be treated without surgery. Treatment includes:  Resting the bowels by only having liquids for a few days. As you improve, you will need to eat a low-fiber diet.   Intravenous (IV) fluids if you are losing body fluids (dehydrated).   Antibiotic medicines that treat infections may be given.   Pain and nausea medicine, if needed.   Surgery if the inflamed diverticulum has burst.  HOME CARE INSTRUCTIONS   Try a clear liquid diet (broth, tea, or water for as long as directed by your caregiver). You may then gradually begin a low-fiber diet as tolerated. A low-fiber diet is a diet with less than 10 grams of fiber. Choose the foods below to reduce fiber in the  diet:   White breads, cereals, rice, and pasta.   Cooked fruits and vegetables or soft fresh fruits and vegetables without the skin.   Ground or well-cooked tender beef, ham, veal, lamb, pork, or poultry.   Eggs and seafood.   After your diverticulitis symptoms have improved, your caregiver may put you on a high-fiber diet. A high-fiber diet includes 14 grams of fiber for every 1000 calories consumed. For a standard 2000 calorie diet, you would need 28 grams of fiber. Follow these diet guidelines to help you increase the fiber in your diet. It is important to slowly increase the amount fiber in your diet to avoid gas, constipation, and bloating.   Choose whole-grain breads, cereals, pasta, and brown rice.   Choose fresh fruits and vegetables with the skin on. Do not overcook vegetables because the more vegetables are cooked, the more fiber is lost.   Choose more nuts, seeds, legumes, dried peas, beans, and lentils.   Look for food products that have greater than 3 grams of fiber per serving on the Nutrition Facts label.   Take all medicine as directed by your caregiver.   If your caregiver has given you a follow-up appointment,  it is very important that you go. Not going could result in lasting (chronic) or permanent injury, pain, and disability. If there is any problem keeping the appointment, call to reschedule.  SEEK MEDICAL CARE IF:   Your pain does not improve.   You have a hard time advancing your diet beyond clear liquids.   Your bowel movements do not return to normal.  SEEK IMMEDIATE MEDICAL CARE IF:   Your pain becomes worse.   You have an oral temperature above 102 F (38.9 C), not controlled by medicine.   You have repeated vomiting.   You have bloody or black, tarry stools.   Symptoms that brought you to your caregiver become worse or are not getting better.  MAKE SURE YOU:   Understand these instructions.   Will watch your condition.   Will get help right  away if you are not doing well or get worse.  Document Released: 01/04/2005 Document Revised: 12/07/2010 Document Reviewed: 05/02/2010 [facility] Patient Information 2012 [place].

## 2011-04-27 ENCOUNTER — Encounter: Payer: Self-pay | Admitting: Gastroenterology

## 2011-05-25 ENCOUNTER — Telehealth: Payer: Self-pay | Admitting: *Deleted

## 2011-05-25 ENCOUNTER — Encounter: Payer: Self-pay | Admitting: Gastroenterology

## 2011-05-25 ENCOUNTER — Ambulatory Visit (INDEPENDENT_AMBULATORY_CARE_PROVIDER_SITE_OTHER): Payer: Medicare Other | Admitting: Gastroenterology

## 2011-05-25 DIAGNOSIS — K5730 Diverticulosis of large intestine without perforation or abscess without bleeding: Secondary | ICD-10-CM

## 2011-05-25 DIAGNOSIS — K219 Gastro-esophageal reflux disease without esophagitis: Secondary | ICD-10-CM

## 2011-05-25 DIAGNOSIS — Z800 Family history of malignant neoplasm of digestive organs: Secondary | ICD-10-CM

## 2011-05-25 MED ORDER — LINACLOTIDE 145 MCG PO CAPS: 1.0000 | ORAL_CAPSULE | Freq: Every day | ORAL | Status: DC

## 2011-05-25 MED ORDER — PEG-KCL-NACL-NASULF-NA ASC-C 100 G PO SOLR: 1.0000 | Freq: Once | ORAL | Status: DC

## 2011-05-25 MED ORDER — ESOMEPRAZOLE MAGNESIUM 40 MG PO CPDR: 40.0000 mg | DELAYED_RELEASE_CAPSULE | Freq: Every day | ORAL | Status: DC

## 2011-05-25 NOTE — Progress Notes (Signed)
This is a extremely pleasant 78 year old Caucasian female with continued gas, bloating, constipation, but no rectal bleeding or systemic complaints. She has a long history of diverticulosis with a suspected sigmoid colon stricture. She recently was treated for diverticulitis,but could not tolerate any antibiotics. CT scan in July of 2012 did show  mild diverticulitis. The patient also has polycystic kidney disease, and mild chronic renal insufficiency. Currently she describes increasing reflux symptoms with hoarseness/ no dysphagia. Endoscopy many years ago showed large vital hernia and erosive esophagitis. She is status post cholecystectomy. Last endoscopy was in March of 2009. She does have a strong family history of colon cancer. Her last colonoscopy require pediatric colonoscope for examination. The patient is on a fairly regular diet, cannot tolerate high fiber, and she uses when necessary enema therapy. She's had no anorexia, weight loss, fever chills, but does have general malaise.  Current Medications, Allergies, Past Medical History, Past Surgical History, Family History and Social History were reviewed in [employer] record.  Pertinent Review of Systems Negative   Physical Exam: Healthy-appearing patient in no acute distress. Blood pressure is 110/62, pulse 76 and regular, BMI 26.75. Cannot appreciate stigmata of chronic liver disease. Examination of the throat and neck areas are unremarkable. Chest is clear, and she appeared to be in a regular rhythm without murmurs gallops or rubs. I cannot appreciate any abdominal distention, organomegaly, masses or tenderness. Bowel sounds are nonobstructive. Peripheral extremities are unremarkable, and mental status is normal.    Assessment and Plan: Recurrent diverticulitis in a 78 year old patient otherwise in good health, but she has a very strong family history of colon cancer in several family members. She has chronic  constipation with a history of intolerance to fiber and to antibiotics. In the past stool softeners have not helped her complaints. We will try Linzess and 45 mcg a day for constipation, and I've scheduled diagnostic colonoscopy with pediatric colonoscope. She has acid reflux chronically, and I have restarted Nexium 40 mg a day, and we'll also repeat her endoscopic exam and dilation if needed. Review of her labs otherwise shows no specific abnormalities. Her primary care physician is Dr. Burnard Bunting. Encounter Diagnoses  Name Primary?  . Family history of malignant neoplasm of gastrointestinal tract   . Esophageal reflux   . Diverticulosis of colon (without mention of hemorrhage)

## 2011-05-25 NOTE — Telephone Encounter (Addendum)
pts insurance will not cover Linzess it will only cover Lotonex, do you want to change? Linzess is $254 for 30 days

## 2011-05-25 NOTE — Patient Instructions (Signed)
Your procedure has been scheduled for 06/26/2011, please follow the seperate instructions.  Your prescription(s) have been sent to you pharmacy.  Take Linzess take one tablet by mouth once a day on a empty stomach.

## 2011-05-26 NOTE — Telephone Encounter (Signed)
Lotronex is for diarrhea,,,give her linzess samples to try

## 2011-05-26 NOTE — Telephone Encounter (Signed)
Pt has samples

## 2011-05-29 ENCOUNTER — Other Ambulatory Visit: Payer: Self-pay | Admitting: Gastroenterology

## 2011-05-29 ENCOUNTER — Ambulatory Visit (AMBULATORY_SURGERY_CENTER): Payer: Medicare Other | Admitting: Gastroenterology

## 2011-05-29 ENCOUNTER — Encounter: Payer: Self-pay | Admitting: Gastroenterology

## 2011-05-29 DIAGNOSIS — K5902 Outlet dysfunction constipation: Secondary | ICD-10-CM

## 2011-05-29 DIAGNOSIS — K2970 Gastritis, unspecified, without bleeding: Secondary | ICD-10-CM

## 2011-05-29 DIAGNOSIS — K319 Disease of stomach and duodenum, unspecified: Secondary | ICD-10-CM

## 2011-05-29 DIAGNOSIS — Z800 Family history of malignant neoplasm of digestive organs: Secondary | ICD-10-CM

## 2011-05-29 DIAGNOSIS — K219 Gastro-esophageal reflux disease without esophagitis: Secondary | ICD-10-CM

## 2011-05-29 DIAGNOSIS — K5730 Diverticulosis of large intestine without perforation or abscess without bleeding: Secondary | ICD-10-CM

## 2011-05-29 DIAGNOSIS — K2990 Gastroduodenitis, unspecified, without bleeding: Secondary | ICD-10-CM

## 2011-05-29 MED ORDER — SODIUM CHLORIDE 0.9 % IV SOLN: 500.0000 mL | INTRAVENOUS | Status: DC

## 2011-05-29 MED ORDER — LINACLOTIDE 145 MCG PO CAPS: 1.0000 | ORAL_CAPSULE | Freq: Every day | ORAL | Status: DC

## 2011-05-29 NOTE — Op Note (Signed)
[facility] [address] [facility] [address]. [place]  [address]  ENDOSCOPY PROCEDURE REPORT  PATIENT:  [name], Oro  MR#:  [id number] BIRTHDATE:  Apr 20, 1932, 78 yrs. old  GENDER:  female  ENDOSCOPIST:  Loralee Pacas. Sharlett Iles, MD, [facility] Referred by:  PROCEDURE DATE:  05/29/2011 PROCEDURE:  EGD with biopsy, 43239, EGD with biopsy for H. pylori 43239 ASA CLASS:  Class II INDICATIONS:  GERD, dyspepsia  MEDICATIONS:   There was residual sedation effect present from prior procedure., propofol (Diprivan) 80 mg IV TOPICAL ANESTHETIC:  DESCRIPTION OF PROCEDURE:   After the risks and benefits of the procedure were explained, informed consent was obtained.  The LB GIF-H180 [id number] endoscope was introduced through the mouth and advanced to the second portion of the duodenum.  The instrument was slowly withdrawn as the mucosa was fully examined. <<PROCEDUREIMAGES>>  A hiatal hernia was found. SMALL 2 CM. HH NOTED  Moderate gastritis was found in the antrum. EROSIONS AND DRIED HEME.CLO AND REGULAR BIOPSIES DONE.  Normal duodenal folds were noted. Otherwise normal esophagus.    Retroflexed views revealed a hiatal hernia.    The scope was then withdrawn from the patient and the procedure completed.  COMPLICATIONS:  None  ENDOSCOPIC IMPRESSION: 1) Hiatal hernia 2) Moderate gastritis in the antrum 3) Normal duodenal folds 4) Otherwise normal esophagus 5) A hiatal hernia 1.TREATED GERD 2.R/O H.PYLORI GASTRITIS RECOMMENDATIONS: 1) Await biopsy results 2) Rx CLO if positive 3) continue PPI  ______________________________ Loralee Pacas. Sharlett Iles, MD, [name]  CC:  Burnard Bunting, MD  n. Lorrin Mais:   Loralee Pacas. Nonnie Pickney at 05/29/2011 02:48 PM  [place], [place], [id number]

## 2011-05-29 NOTE — Op Note (Signed)
[facility] [address]. [place], [place]  [address]  COLONOSCOPY PROCEDURE REPORT  PATIENT:  [name], [name]  MR#:  [id number] BIRTHDATE:  1932-09-20, 78 yrs. old  GENDER:  female ENDOSCOPIST:  Loralee Pacas. Sharlett Iles, MD, [facility] REF. BY: PROCEDURE DATE:  05/29/2011 PROCEDURE:  Diagnostic Colonoscopy ASA CLASS:  Class II INDICATIONS:  Abdominal pain COMPLICATED DIVERTICULOSIS. MEDICATIONS:   propofol (Diprivan) 200 mg IV  DESCRIPTION OF PROCEDURE:   After the risks and benefits and of the procedure were explained, informed consent was obtained. Digital rectal exam was performed and revealed no abnormalities. The [id number] endoscope was introduced through the anus and advanced to the cecum, which was identified by both the appendix and ileocecal valve.  The quality of the prep was excellent, using MoviPrep.  The instrument was then slowly withdrawn as the colon was fully examined. <<PROCEDUREIMAGES>>  FINDINGS:  Severe diverticulosis was found in the sigmoid to descending colon segments. NO TRUE STRICTURE NOTED.PEDIATRIC SCOPE USED.  No polyps or cancers were seen.  This was otherwise a normal examination of the colon.   Retroflexed views in the rectum revealed no abnormalities.    The scope was then withdrawn from the patient and the procedure completed.  COMPLICATIONS:  None ENDOSCOPIC IMPRESSION: 1) Severe diverticulosis in the sigmoid to descending colon segments 2) No polyps or cancers 3) Otherwise normal examination RECOMMENDATIONS: 1) Continue current medications 2) High fiber diet with liberal fluid intake. 3) metamucil or benefiber CONSIDER SIGMOID RESECTION PER RECURRENT PAIN AND ALLERGIES TO ALL ANTIBIOTICS.  REPEAT EXAM:  No  ______________________________ Loralee Pacas. Sharlett Iles, MD, [name]  CC:  n. eSIGNED:   Loralee Pacas. Patterson at 05/29/2011 02:43 PM  [place], [place], [id number]

## 2011-05-29 NOTE — Patient Instructions (Addendum)
YOU HAD AN ENDOSCOPIC PROCEDURE TODAY AT THE [facility]: Refer to the procedure report that was given to you for any specific questions about what was found during the examination.  If the procedure report does not answer your questions, please call your gastroenterologist to clarify.  If you requested that your care partner not be given the details of your procedure findings, then the procedure report has been included in a sealed envelope for you to review at your convenience later.  YOU SHOULD EXPECT: Some feelings of bloating in the abdomen. Passage of more gas than usual.  Walking can help get rid of the air that was put into your GI tract during the procedure and reduce the bloating. If you had a lower endoscopy (such as a colonoscopy or flexible sigmoidoscopy) you may notice spotting of blood in your stool or on the toilet paper. If you underwent a bowel prep for your procedure, then you may not have a normal bowel movement for a few days.  DIET: Your first meal following the procedure should be a light meal and then it is ok to progress to your normal diet.  A half-sandwich or bowl of soup is an example of a good first meal.  Heavy or fried foods are harder to digest and may make you feel nauseous or bloated.  Likewise meals heavy in dairy and vegetables can cause extra gas to form and this can also increase the bloating.  Drink plenty of fluids but you should avoid alcoholic beverages for 24 hours.  ACTIVITY: Your care partner should take you home directly after the procedure.  You should plan to take it easy, moving slowly for the rest of the day.  You can resume normal activity the day after the procedure however you should NOT DRIVE or use heavy machinery for 24 hours (because of the sedation medicines used during the test).    SYMPTOMS TO REPORT IMMEDIATELY: A gastroenterologist can be reached at any hour.  During normal business hours, 8:30 AM to 5:00 PM Monday through Friday,  call [phone].  After hours and on weekends, please call the GI answering service at [phone] who will take a message and have the physician on call contact you.   Following lower endoscopy (colonoscopy or flexible sigmoidoscopy):  Excessive amounts of blood in the stool  Significant tenderness or worsening of abdominal pains  Swelling of the abdomen that is new, acute  Fever of 100F or higher  Following upper endoscopy (EGD)  Vomiting of blood or coffee ground material  New chest pain or pain under the shoulder blades  Painful or persistently difficult swallowing  New shortness of breath  Fever of 100F or higher  Black, tarry-looking stools  FOLLOW UP: If any biopsies were taken you will be contacted by phone or by letter within the next 1-3 weeks.  Call your gastroenterologist if you have not heard about the biopsies in 3 weeks.  Our staff will call the home number listed on your records the next business day following your procedure to check on you and address any questions or concerns that you may have at that time regarding the information given to you following your procedure. This is a courtesy call and so if there is no answer at the home number and we have not heard from you through the emergency physician on call, we will assume that you have returned to your regular daily activities without incident.  SIGNATURES/CONFIDENTIALITY: You and/or your care   partner have signed paperwork which will be entered into your electronic medical record.  These signatures attest to the fact that that the information above on your After Visit Summary has been reviewed and is understood.  Full responsibility of the confidentiality of this discharge information lies with you and/or your care-partner.   FOLLOW UP APPOINTMENT WITH DR. PATTERSON MARCH 5,2013 AT 8:45AM

## 2011-05-29 NOTE — Progress Notes (Signed)
Patient did not experience any of the following events: a burn prior to discharge; a fall within the facility; wrong site/side/patient/procedure/implant event; or a hospital transfer or hospital admission upon discharge from the facility. (G8907) Patient did not have preoperative order for IV antibiotic SSI prophylaxis. (G8918)  

## 2011-05-30 ENCOUNTER — Telehealth: Payer: Self-pay | Admitting: *Deleted

## 2011-05-30 NOTE — Telephone Encounter (Signed)
No answer do not have permission to leave message.

## 2011-05-31 ENCOUNTER — Encounter: Payer: Self-pay | Admitting: Gastroenterology

## 2011-05-31 LAB — HELICOBACTER PYLORI SCREEN-BIOPSY: UREASE: NEGATIVE

## 2011-06-02 ENCOUNTER — Encounter: Payer: Self-pay | Admitting: Gastroenterology

## 2011-06-05 DIAGNOSIS — K2970 Gastritis, unspecified, without bleeding: Secondary | ICD-10-CM | POA: Insufficient documentation

## 2011-06-13 ENCOUNTER — Ambulatory Visit (INDEPENDENT_AMBULATORY_CARE_PROVIDER_SITE_OTHER): Payer: Medicare Other | Admitting: Gastroenterology

## 2011-06-13 ENCOUNTER — Encounter: Payer: Self-pay | Admitting: Gastroenterology

## 2011-06-13 DIAGNOSIS — Z8719 Personal history of other diseases of the digestive system: Secondary | ICD-10-CM | POA: Insufficient documentation

## 2011-06-13 DIAGNOSIS — K5730 Diverticulosis of large intestine without perforation or abscess without bleeding: Secondary | ICD-10-CM

## 2011-06-13 DIAGNOSIS — K5909 Other constipation: Secondary | ICD-10-CM | POA: Insufficient documentation

## 2011-06-13 DIAGNOSIS — Z800 Family history of malignant neoplasm of digestive organs: Secondary | ICD-10-CM

## 2011-06-13 DIAGNOSIS — K5900 Constipation, unspecified: Secondary | ICD-10-CM

## 2011-06-13 DIAGNOSIS — K219 Gastro-esophageal reflux disease without esophagitis: Secondary | ICD-10-CM

## 2011-06-13 MED ORDER — ESOMEPRAZOLE MAGNESIUM 40 MG PO CPDR: 40.0000 mg | DELAYED_RELEASE_CAPSULE | Freq: Every day | ORAL | Status: DC

## 2011-06-13 MED ORDER — LINACLOTIDE 145 MCG PO CAPS: 1.0000 | ORAL_CAPSULE | Freq: Every day | ORAL | Status: DC

## 2011-06-13 NOTE — Patient Instructions (Signed)
Your prescription(s) have been sent to you pharmacy.  Buy Metamucil OTC and take one tablespoon once a day.  We will contact you about having a PhD sit down with you to go over your medications and allergies.

## 2011-06-13 NOTE — Progress Notes (Signed)
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 She recently was treated for acute diverticulitis successfully with metronidazole and Cipro, but had also some reaction to these drugs, she has a history of multiple drug sensitivities. Followup colonoscopy was fairly unremarkable, and she is currently asymptomatic, high fiber diet, but has not initiated Metamucil as requested. She has chronic functional constipation markedly improved on Linzess 145 mg a day. She denies lower abdominal pain, rectal bleeding, or any upper GI symptoms on Nexium 40 mg a day. Her appetite is good her weight is stable.    Current Medications, Allergies, Past Medical History, Past Surgical History, Family History and Social History were reviewed in [employer] record.   Assessment and plan: Resolved diverticulitis and also GERD under good control on PPI therapy. I have urged her to continue a high fiber diet with daily Metamucil and liberal by mouth fluids. We again have reviewed diverticulosis and its management, also chronic acid reflux precautions. I have scheduled her appointment with our clinical pharmacologist review her medications and allergies. We'll see her on a when necessary basis as needed. At the time of endoscopy, she tested negative for H. pylori infection. Review of her CBC, metabolic and anemia profile showed no abnormality. Encounter Diagnoses  Name Primary?  . Family history of malignant neoplasm of gastrointestinal tract   . Esophageal reflux   . Diverticulosis of colon (without mention of hemorrhage)

## 2011-06-23 ENCOUNTER — Ambulatory Visit (INDEPENDENT_AMBULATORY_CARE_PROVIDER_SITE_OTHER): Payer: Medicare Other | Admitting: Pharmacist

## 2011-06-23 DIAGNOSIS — Z7901 Long term (current) use of anticoagulants: Secondary | ICD-10-CM

## 2011-06-28 NOTE — Progress Notes (Signed)
  Ms. Costenbader is referred to MTM clinic by Dr. Sharlett Iles to help clarify her allergy list.  Pt has an extensive list that includes flagyl, cipro, penicillins, statins, sulfa, Vioxx, hydrocodone-APAP, and zofran.  Pt has a hard time remembering exactly why each of these was placed on her allergy list and is concerned she is running out of options for treatment of various infections.  We reviewed each allergy in detail with the patient.  Flaygl and Cipro were the most recently added allergies.  She recent took both for a GI infection.  After she took the combination for a few days, she noticed that her legs began to feel heavy and weak.  Of note, she does have a hx of venous insufficiency in her L leg.  She did stop the Cipro for 4 days and this helped relieve her leg pain.  Based on this information, we agreed to remove Flagyl from her list of allergies since she was able to tolerate this as monotherapy and changed Cipro from an allergy to an intolerance.  With the penicillins, the patient only remembers that she took something after her tonsillectomy in her 31s that caused a rash.  Based on what she told us she thought the name was, it sounds like Duricef, a 1st generation cephalosporin.  Will change penicillin allergy to cephalosporin allergy of rash.  Pt had an intolerance to statins that was severe leg pain that resulted in visit to ER.  Pt could not remember what happened with Sulfa or Zofran.  There is no information in our records of her reaction to these medications.  Unfortunately she did not bring any information from her primary care doctor with her today.  For now, will remove from the list and add if pt is able to review with PCP and determine reaction.  With Vioxx and hydrocodone-acetaminophen, pt only experienced some nausea so those were changed from an allergy to an intolerance.    We also reviewed pt's medication list.  She has recently stopped her Linzess due to diarrhea and her Nexium because she  is afraid of leg pains.  Will let Dr. Sharlett Iles know she is no longer taking these as prescribed.

## 2013-01-13 ENCOUNTER — Ambulatory Visit (INDEPENDENT_AMBULATORY_CARE_PROVIDER_SITE_OTHER): Payer: Medicare Other | Admitting: Emergency Medicine

## 2013-01-13 VITALS — BP 140/80 | HR 60 | Temp 98.0°F | Resp 16 | Ht 60.5 in | Wt 151.0 lb

## 2013-01-13 DIAGNOSIS — R21 Rash and other nonspecific skin eruption: Secondary | ICD-10-CM

## 2013-01-13 MED ORDER — BETAMETHASONE DIPROPIONATE AUG 0.05 % EX CREA: TOPICAL_CREAM | Freq: Two times a day (BID) | CUTANEOUS | Status: DC

## 2013-01-13 NOTE — Patient Instructions (Addendum)
Apply your cream 3 times a day  and take 2 Benadryl 25 mg one to 2 every 4-6       Poison Ivy Poison ivy is a inflammation of the skin (contact dermatitis) caused by touching the allergens on the leaves of the ivy plant following previous exposure to the plant. The rash usually appears 48 hours after exposure. The rash is usually bumps (papules) or blisters (vesicles) in a linear pattern. Depending on your own sensitivity, the rash may simply cause redness and itching, or it may also progress to blisters which may break open. These must be well cared for to prevent secondary bacterial (germ) infection, followed by scarring. Keep any open areas dry, clean, dressed, and covered with an antibacterial ointment if needed. The eyes may also get puffy. The puffiness is worst in the morning and gets better as the day progresses. This dermatitis usually heals without scarring, within 2 to 3 weeks without treatment. HOME CARE INSTRUCTIONS  Thoroughly wash with soap and water as soon as you have been exposed to poison ivy. You have about one half hour to remove the plant resin before it will cause the rash. This washing will destroy the oil or antigen on the skin that is causing, or will cause, the rash. Be sure to wash under your fingernails as any plant resin there will continue to spread the rash. Do not rub skin vigorously when washing affected area. Poison ivy cannot spread if no oil from the plant remains on your body. A rash that has progressed to weeping sores will not spread the rash unless you have not washed thoroughly. It is also important to wash any clothes you have been wearing as these may carry active allergens. The rash will return if you wear the unwashed clothing, even several days later. Avoidance of the plant in the future is the best measure. Poison ivy plant can be recognized by the number of leaves. Generally, poison ivy has three leaves with flowering branches on a single stem. Diphenhydramine  may be purchased over the counter and used as needed for itching. Do not drive with this medication if it makes you drowsy.Ask your caregiver about medication for children. SEEK MEDICAL CARE IF:  Open sores develop.  Redness spreads beyond area of rash.  You notice purulent (pus-like) discharge.  You have increased pain.  Other signs of infection develop (such as fever). Document Released: 03/24/2000 Document Revised: 06/19/2011 Document Reviewed: 02/10/2009 [facility] Patient Information 2014 [place], [place].

## 2013-01-13 NOTE — Progress Notes (Signed)
  Subjective:    Patient ID: [name], female    DOB: 06/26/1932, 79 y.o.   MRN: [id number]  HPI  79 y.o. Female presents to clinic today with rash. States that it started on neck and has since noticed it breaking out on fingers. Working out in yard x 1 week ago. Noticed itching on 10/3 and breaking out since then. Used cortizone cream and diphenhydramine for symptoms. Noticed break out behind ear this morning.  Review of Systems     Objective:   Physical Exam there are red firm testicle areas over the right side of the neck and over the fingers of both hands. There are no lesions on the chest or the back        Assessment & Plan:  Patient has a contact dermatitis. We'll treat with Diprolene cream and Zyrtec 10 mg one a day and she can take this along with the diphenhydramine she has at home.

## 2013-01-14 ENCOUNTER — Telehealth: Payer: Self-pay

## 2013-01-14 NOTE — Telephone Encounter (Addendum)
The zyrtec? No, patient advised the Benadryl is causing her to feel bad, she indicates she took two after she got home, and the problem lasted only for a few minutes. She feels better today she has seen her PCP and states she is fine, she will see the cardiologist tomorrow. I have advised her not to take benadryl again, and have documented this as an allergy in her chart. To you FYI

## 2013-01-14 NOTE — Telephone Encounter (Signed)
PT STATES SHE WAS DIAGNOSED WITH POISON IVY AND THE MEDICINE SHE WAS GIVEN IS MAKING HER HEART ACT UP AND SHE DOESN'T THINK IT AGREES WITH HER PLEASE CALL [phone]

## 2013-01-21 ENCOUNTER — Telehealth: Payer: Self-pay

## 2013-01-21 NOTE — Telephone Encounter (Signed)
Is she using Zyrtec? She was advised not to use the Benadryl b/c of heart racing. She did see the cardiologist after the episode with her heart. She will try Zyrtec, she has not tried this.

## 2013-01-21 NOTE — Telephone Encounter (Signed)
[facility] [address]   Can't take the benadryl.   Needs something for itching.    [phone]  Cell

## 2013-06-02 ENCOUNTER — Emergency Department (HOSPITAL_COMMUNITY): Payer: Medicare Other

## 2013-06-02 ENCOUNTER — Encounter (HOSPITAL_COMMUNITY): Payer: Self-pay | Admitting: Emergency Medicine

## 2013-06-02 ENCOUNTER — Emergency Department (HOSPITAL_COMMUNITY)
Admission: EM | Admit: 2013-06-02 | Discharge: 2013-06-02 | Disposition: A | Discharge status: Home / Self Care | Payer: Medicare Other | Attending: Emergency Medicine | Admitting: Emergency Medicine

## 2013-06-02 DIAGNOSIS — E119 Type 2 diabetes mellitus without complications: Secondary | ICD-10-CM | POA: Insufficient documentation

## 2013-06-02 DIAGNOSIS — Z79899 Other long term (current) drug therapy: Secondary | ICD-10-CM | POA: Insufficient documentation

## 2013-06-02 DIAGNOSIS — I2510 Atherosclerotic heart disease of native coronary artery without angina pectoris: Secondary | ICD-10-CM | POA: Insufficient documentation

## 2013-06-02 DIAGNOSIS — Z8719 Personal history of other diseases of the digestive system: Secondary | ICD-10-CM | POA: Insufficient documentation

## 2013-06-02 DIAGNOSIS — I10 Essential (primary) hypertension: Secondary | ICD-10-CM | POA: Insufficient documentation

## 2013-06-02 DIAGNOSIS — Z8669 Personal history of other diseases of the nervous system and sense organs: Secondary | ICD-10-CM | POA: Insufficient documentation

## 2013-06-02 DIAGNOSIS — Z85828 Personal history of other malignant neoplasm of skin: Secondary | ICD-10-CM | POA: Insufficient documentation

## 2013-06-02 DIAGNOSIS — R109 Unspecified abdominal pain: Secondary | ICD-10-CM

## 2013-06-02 DIAGNOSIS — E669 Obesity, unspecified: Secondary | ICD-10-CM | POA: Insufficient documentation

## 2013-06-02 DIAGNOSIS — Z8673 Personal history of transient ischemic attack (TIA), and cerebral infarction without residual deficits: Secondary | ICD-10-CM | POA: Insufficient documentation

## 2013-06-02 DIAGNOSIS — Z87442 Personal history of urinary calculi: Secondary | ICD-10-CM | POA: Insufficient documentation

## 2013-06-02 DIAGNOSIS — Z9089 Acquired absence of other organs: Secondary | ICD-10-CM | POA: Insufficient documentation

## 2013-06-02 DIAGNOSIS — M129 Arthropathy, unspecified: Secondary | ICD-10-CM | POA: Insufficient documentation

## 2013-06-02 DIAGNOSIS — R1031 Right lower quadrant pain: Secondary | ICD-10-CM | POA: Insufficient documentation

## 2013-06-02 LAB — CBC WITH DIFFERENTIAL/PLATELET
Basophils Absolute: 0 10*3/uL (ref 0.0–0.1)
Basophils Relative: 1 % (ref 0–1)
EOS ABS: 0.2 10*3/uL (ref 0.0–0.7)
Eosinophils Relative: 3 % (ref 0–5)
HEMATOCRIT: 36.7 % (ref 36.0–46.0)
HEMOGLOBIN: 12.2 g/dL (ref 12.0–15.0)
Lymphocytes Relative: 28 % (ref 12–46)
Lymphs Abs: 1.6 10*3/uL (ref 0.7–4.0)
MCH: 29.7 pg (ref 26.0–34.0)
MCHC: 33.2 g/dL (ref 30.0–36.0)
MCV: 89.3 fL (ref 78.0–100.0)
MONO ABS: 0.6 10*3/uL (ref 0.1–1.0)
MONOS PCT: 10 % (ref 3–12)
Neutro Abs: 3.3 10*3/uL (ref 1.7–7.7)
Neutrophils Relative %: 58 % (ref 43–77)
Platelets: 208 10*3/uL (ref 150–400)
RBC: 4.11 MIL/uL (ref 3.87–5.11)
RDW: 13.8 % (ref 11.5–15.5)
WBC: 5.6 10*3/uL (ref 4.0–10.5)

## 2013-06-02 LAB — COMPREHENSIVE METABOLIC PANEL
ALK PHOS: 57 U/L (ref 39–117)
ALT: 9 U/L (ref 0–35)
AST: 21 U/L (ref 0–37)
Albumin: 3.7 g/dL (ref 3.5–5.2)
BILIRUBIN TOTAL: 0.4 mg/dL (ref 0.3–1.2)
BUN: 20 mg/dL (ref 6–23)
CHLORIDE: 104 meq/L (ref 96–112)
CO2: 24 mEq/L (ref 19–32)
CREATININE: 1.12 mg/dL — AB (ref 0.50–1.10)
Calcium: 9.6 mg/dL (ref 8.4–10.5)
GFR calc non Af Amer: 46 mL/min — ABNORMAL LOW (ref >90)
GFR, EST AFRICAN AMERICAN: 53 mL/min — AB (ref >90)
Glucose, Bld: 95 mg/dL (ref 70–99)
Potassium: 4.5 mEq/L (ref 3.7–5.3)
Sodium: 140 mEq/L (ref 137–147)
Total Protein: 7 g/dL (ref 6.0–8.3)

## 2013-06-02 LAB — URINALYSIS, ROUTINE W REFLEX MICROSCOPIC
Bilirubin Urine: NEGATIVE
Glucose, UA: NEGATIVE mg/dL
Hgb urine dipstick: NEGATIVE
KETONES UR: NEGATIVE mg/dL
Leukocytes, UA: NEGATIVE
NITRITE: NEGATIVE
Protein, ur: NEGATIVE mg/dL
Specific Gravity, Urine: 1.015 (ref 1.005–1.030)
UROBILINOGEN UA: 0.2 mg/dL (ref 0.0–1.0)
pH: 5.5 (ref 5.0–8.0)

## 2013-06-02 MED ORDER — ACETAMINOPHEN 325 MG PO TABS: 325.0000 mg | ORAL_TABLET | Freq: Four times a day (QID) | ORAL | Status: DC | PRN

## 2013-06-02 MED ORDER — IBUPROFEN 600 MG PO TABS: 600.0000 mg | ORAL_TABLET | Freq: Four times a day (QID) | ORAL | Status: DC | PRN

## 2013-06-02 MED ORDER — IOHEXOL 300 MG/ML  SOLN
50.0000 mL | Freq: Once | INTRAMUSCULAR | Status: AC | PRN
  Administered 2013-06-02: 50 mL via ORAL

## 2013-06-02 MED ORDER — ONDANSETRON 4 MG PO TBDP: 4.0000 mg | ORAL_TABLET | Freq: Three times a day (TID) | ORAL | Status: DC | PRN

## 2013-06-02 MED ORDER — SODIUM CHLORIDE 0.9 % IV BOLUS (SEPSIS)
1000.0000 mL | Freq: Once | INTRAVENOUS | Status: AC
  Administered 2013-06-02: 1000 mL via INTRAVENOUS

## 2013-06-02 MED ORDER — ONDANSETRON HCL 4 MG/2ML IJ SOLN
4.0000 mg | Freq: Once | INTRAMUSCULAR | Status: AC
  Administered 2013-06-02: 4 mg via INTRAVENOUS
  Filled 2013-06-02: qty 2

## 2013-06-02 MED ORDER — IOHEXOL 300 MG/ML  SOLN
100.0000 mL | Freq: Once | INTRAMUSCULAR | Status: AC | PRN
  Administered 2013-06-02: 100 mL via INTRAVENOUS

## 2013-06-02 MED ORDER — MORPHINE SULFATE 4 MG/ML IJ SOLN
2.0000 mg | Freq: Once | INTRAMUSCULAR | Status: AC
  Administered 2013-06-02: 2 mg via INTRAVENOUS
  Filled 2013-06-02: qty 1

## 2013-06-02 NOTE — ED Provider Notes (Signed)
CSN: [id number]     Arrival date & time 06/02/13  0913 History   First MD Initiated Contact with Patient 06/02/13 [phone]     Chief Complaint  Patient presents with  . Abdominal Pain     (Consider location/radiation/quality/duration/timing/severity/associated sxs/prior Treatment) HPI Comments: Patient is a 80 year old female past medical history significant for diverticulosis, hypertension, hypercholesterolemia, history of TIA, arthritis, history of ischemic colitis, CAD, GERD, familial tremor, TM presenting to the emergency department for acute onset right lower quadrant pain without radiation that began at 2 AM this morning. She is only able to describe her pain as "bad." Patient states the pain is intermittent and seems to be provoked by certain movements. She states laying still alleviates her pain. Patient states when she gets her pain as a 10 out of 10. She states it is otherwise a 1/10. Patient denies any associated fevers, chills, nausea, vomiting, diarrhea, constipation, urinary or GU symptoms. Patient's only abdominal surgical history includes cholecystectomy.   Patient is a 80 y.o. female presenting with abdominal pain.  Abdominal Pain Associated symptoms: no chest pain, no chills, no constipation, no diarrhea, no fever, no nausea, no shortness of breath and no vomiting     Past Medical History  Diagnosis Date  . Diverticulosis of colon (without mention of hemorrhage)   . Hiatal hernia   . Unspecified gastritis and gastroduodenitis without mention of hemorrhage   . Basal cell carcinoma of nose   . Hypertension   . Hypercholesterolemia   . TIA (transient ischemic attack)   . Arthritis   . Colitis, ischemic   . CAD (coronary artery disease)   . Esophageal stricture   . History of gallstones   . GERD (gastroesophageal reflux disease)   . History of kidney stones   . Obesity   . Familial tremor   . Diabetes mellitus without complication    Past Surgical History  Procedure  Laterality Date  . Tonsillectomy and adenoidectomy  1958  . Lumbar disc surgery  1998    right L1-L2  . Cholecystectomy, laparoscopic  2002  . Rotator cuff repair  2003    bilateral  . Tendon graft  2003    with bone graft  . Skin biopsy      four times last 2005  . Cataract extraction  2005    bilateral  . Cataract extraction w/ intraocular lens  implant, bilateral    . Familiar tremor     Family History  Problem Relation Age of Onset  . Heart disease Sister   . Diabetes Brother     x 4  . Colon cancer Sister   . Crohn's disease      nephew  . Colon cancer Maternal Aunt   . Colon cancer Maternal Uncle   . Heart disease Brother   . Diabetes Sister     x 2  . Prostate cancer Brother   . Colon polyps      nephew  . Leukemia Sister    History  Substance Use Topics  . Smoking status: Never Smoker   . Smokeless tobacco: Never Used  . Alcohol Use: No   OB History   Grav Para Term Preterm Abortions TAB SAB Ect Mult Living                 Review of Systems  Constitutional: Negative for fever and chills.  Respiratory: Negative for shortness of breath.   Cardiovascular: Negative for chest pain.  Gastrointestinal: Positive for abdominal pain. Negative  for nausea, vomiting, diarrhea, constipation, blood in stool and anal bleeding.  Musculoskeletal: Negative for back pain.  All other systems reviewed and are negative.      Allergies  Benadryl; Ciprofloxacin; Pravastatin; Vioxx; and Cephalosporins  Home Medications   Current Outpatient Rx  Name  Route  Sig  Dispense  Refill  . valsartan (DIOVAN) 80 MG tablet   Oral   Take 80 mg by mouth daily.          Marland Kitchen acetaminophen (TYLENOL) 325 MG tablet   Oral   Take 1 tablet (325 mg total) by mouth every 6 (six) hours as needed for mild pain, moderate pain or fever.   30 tablet   0   . ibuprofen (ADVIL,MOTRIN) 600 MG tablet   Oral   Take 1 tablet (600 mg total) by mouth every 6 (six) hours as needed for mild pain,  moderate pain or cramping.   30 tablet   0   . ondansetron (ZOFRAN ODT) 4 MG disintegrating tablet   Oral   Take 1 tablet (4 mg total) by mouth every 8 (eight) hours as needed for nausea or vomiting.   10 tablet   0    BP 112/68  Pulse 53  Temp(Src) 98.2 F (36.8 C) (Oral)  Resp 18  SpO2 100% Physical Exam  Nursing note and vitals reviewed. Constitutional: She is oriented to person, place, and time. She appears well-developed and well-nourished. No distress.  HENT:  Head: Normocephalic and atraumatic.  Right Ear: External ear normal.  Left Ear: External ear normal.  Nose: Nose normal.  Mouth/Throat: Oropharynx is clear and moist. No oropharyngeal exudate.  Eyes: Conjunctivae are normal.  Neck: Neck supple.  Cardiovascular: Normal rate, regular rhythm and normal heart sounds.   Pulmonary/Chest: Effort normal and breath sounds normal. No respiratory distress.  Abdominal: Soft. Bowel sounds are normal. She exhibits no distension. There is no tenderness. There is no rigidity, no rebound and no guarding.  Musculoskeletal: Normal range of motion.  Neurological: She is alert and oriented to person, place, and time. She has normal strength. Gait normal. GCS eye subscore is 4. GCS verbal subscore is 5. GCS motor subscore is 6.  Moving all extremities.   Skin: Skin is warm and dry. She is not diaphoretic.    ED Course  Procedures (including critical care time) Medications  sodium chloride 0.9 % bolus 1,000 mL (0 mLs Intravenous Stopped 06/02/13 1336)  morphine 4 MG/ML injection 2 mg (2 mg Intravenous Given 06/02/13 1043)  ondansetron (ZOFRAN) injection 4 mg (4 mg Intravenous Given 06/02/13 1041)  iohexol (OMNIPAQUE) 300 MG/ML solution 50 mL (50 mLs Oral Contrast Given 06/02/13 1016)  iohexol (OMNIPAQUE) 300 MG/ML solution 100 mL (100 mLs Intravenous Contrast Given 06/02/13 1150)    Labs Review Labs Reviewed  COMPREHENSIVE METABOLIC PANEL - Abnormal; Notable for the following:     Creatinine, Ser 1.12 (*)    GFR calc non Af Amer 46 (*)    GFR calc Af Amer 53 (*)    All other components within normal limits  URINE CULTURE  CBC WITH DIFFERENTIAL  URINALYSIS, ROUTINE W REFLEX MICROSCOPIC   Imaging Review Ct Abdomen Pelvis W Contrast  06/02/2013   CLINICAL DATA:  Acute onset of intermittent right lower quadrant pain last night.  EXAM: CT ABDOMEN AND PELVIS WITH CONTRAST  TECHNIQUE: Multidetector CT imaging of the abdomen and pelvis was performed using the standard protocol following bolus administration of intravenous contrast.  CONTRAST:  50 mL  OMNIPAQUE IOHEXOL 300 MG/ML SOLN, 100 mL OMNIPAQUE IOHEXOL 300 MG/ML SOLN  COMPARISON:  CT abdomen and pelvis 10/10/2010. CT abdomen and pelvis 03/12/2003.  FINDINGS: The lung bases are clear. There is no pleural or pericardial effusion.  The patient is status post cholecystectomy. There is mild intrahepatic biliary ductal dilatation likely related the patient's age and prior cholecystectomy. The liver is otherwise unremarkable. The spleen, adrenal glands and pancreas appear normal. Innumerable bilateral low attenuating renal lesions are most consistent with cysts and not markedly changed since the 2004 examination. The appendix is not visualized and may have been removed. It is not seen on the prior exams. There is sigmoid diverticulosis without diverticulitis. The colon is otherwise unremarkable. Stomach and small bowel appear normal. No lymphadenopathy or fluid is seen. Uterus, adnexa and urinary bladder appear normal. No focal bony abnormality is seen with multilevel lumbar spondylosis noted.  IMPRESSION: No acute finding.  Sigmoid diverticulosis without diverticulitis.  Multiple bilateral renal cysts, unchanged.   Electronically Signed   By: Inge Rise M.D.   On: 06/02/2013 12:14    EKG Interpretation   None       MDM   Final diagnoses:  Abdominal pain    Filed Vitals:   06/02/13 1338  BP: 112/68  Pulse: 53  Temp:    Resp: 18    Afebrile, NAD, non-toxic appearing, AAOx4. Abdomen soft, non-tender, non-distended. No guarding, rigidity or rebound. Patient only having pain when ambulating or changing position. Patient is nontoxic, nonseptic appearing, in no apparent distress.  Patient's pain and other symptoms adequately managed in emergency department.  Fluid bolus given.  Labs, imaging and vitals reviewed. Patient has diverticulosis w/o diverticulitis.  Patient does not meet the SIRS or Sepsis criteria.  On repeat exam patient does not have a surgical abdomin and there are nor peritoneal signs.  No indication of appendicitis, bowel obstruction, bowel perforation, cholecystitis, diverticulitis.  Patient discharged home with symptomatic treatment and given strict instructions for follow-up with their primary care physician.  I have also discussed reasons to return immediately to the ER.  Patient expresses understanding and agrees with plan. Patient d/w with Dr. Darl Householder, agrees with plan.         Harlow Mares, PA-C 06/02/13 1514

## 2013-06-02 NOTE — Discharge Instructions (Signed)
Please follow up with your primary care physician in 1-2 days. If you do not have one please call the [place] number listed above. Please take alternate between Tylenol and Motrin every 3 hours as needed for pain. You may take the Zofran 15-20 minutes prior to Motrin if you find it makes you nauseous. Please read all discharge instructions and return precautions.   Abdominal Pain, Women Abdominal (stomach, pelvic, or belly) pain can be caused by many things. It is important to tell your doctor:  The location of the pain.  Does it come and go or is it present all the time?  Are there things that start the pain (eating certain foods, exercise)?  Are there other symptoms associated with the pain (fever, nausea, vomiting, diarrhea)? All of this is helpful to know when trying to find the cause of the pain. CAUSES   Stomach: virus or bacteria infection, or ulcer.  Intestine: appendicitis (inflamed appendix), regional ileitis (Crohn's disease), ulcerative colitis (inflamed colon), irritable bowel syndrome, diverticulitis (inflamed diverticulum of the colon), or cancer of the stomach or intestine.  Gallbladder disease or stones in the gallbladder.  Kidney disease, kidney stones, or infection.  Pancreas infection or cancer.  Fibromyalgia (pain disorder).  Diseases of the female organs:  Uterus: fibroid (non-cancerous) tumors or infection.  Fallopian tubes: infection or tubal pregnancy.  Ovary: cysts or tumors.  Pelvic adhesions (scar tissue).  Endometriosis (uterus lining tissue growing in the pelvis and on the pelvic organs).  Pelvic congestion syndrome (female organs filling up with blood just before the menstrual period).  Pain with the menstrual period.  Pain with ovulation (producing an egg).  Pain with an IUD (intrauterine device, birth control) in the uterus.  Cancer of the female organs.  Functional pain (pain not caused by a disease, may  improve without treatment).  Psychological pain.  Depression. DIAGNOSIS  Your doctor will decide the seriousness of your pain by doing an examination.  Blood tests.  X-rays.  Ultrasound.  CT scan (computed tomography, special type of X-ray).  MRI (magnetic resonance imaging).  Cultures, for infection.  Barium enema (dye inserted in the large intestine, to better view it with X-rays).  Colonoscopy (looking in intestine with a lighted tube).  Laparoscopy (minor surgery, looking in abdomen with a lighted tube).  Major abdominal exploratory surgery (looking in abdomen with a large incision). TREATMENT  The treatment will depend on the cause of the pain.   Many cases can be observed and treated at home.  Over-the-counter medicines recommended by your caregiver.  Prescription medicine.  Antibiotics, for infection.  Birth control pills, for painful periods or for ovulation pain.  Hormone treatment, for endometriosis.  Nerve blocking injections.  Physical therapy.  Antidepressants.  Counseling with a psychologist or psychiatrist.  Minor or major surgery. HOME CARE INSTRUCTIONS   Do not take laxatives, unless directed by your caregiver.  Take over-the-counter pain medicine only if ordered by your caregiver. Do not take aspirin because it can cause an upset stomach or bleeding.  Try a clear liquid diet (broth or water) as ordered by your caregiver. Slowly move to a bland diet, as tolerated, if the pain is related to the stomach or intestine.  Have a thermometer and take your temperature several times a day, and record it.  Bed rest and sleep, if it helps the pain.  Avoid sexual intercourse, if it causes pain.  Avoid stressful situations.  Keep your follow-up appointments and tests, as your  caregiver orders.  If the pain does not go away with medicine or surgery, you may try:  Acupuncture.  Relaxation exercises (yoga, meditation).  Group  therapy.  Counseling. SEEK MEDICAL CARE IF:   You notice certain foods cause stomach pain.  Your home care treatment is not helping your pain.  You need stronger pain medicine.  You want your IUD removed.  You feel faint or lightheaded.  You develop nausea and vomiting.  You develop a rash.  You are having side effects or an allergy to your medicine. SEEK IMMEDIATE MEDICAL CARE IF:   Your pain does not go away or gets worse.  You have a fever.  Your pain is felt only in portions of the abdomen. The right side could possibly be appendicitis. The left lower portion of the abdomen could be colitis or diverticulitis.  You are passing blood in your stools (bright red or black tarry stools, with or without vomiting).  You have blood in your urine.  You develop chills, with or without a fever.  You pass out. MAKE SURE YOU:   Understand these instructions.  Will watch your condition.  Will get help right away if you are not doing well or get worse. Document Released: 01/22/2007 Document Revised: 06/19/2011 Document Reviewed: 02/11/2009 [facility] Patient Information 2014 [place], [place].

## 2013-06-02 NOTE — ED Notes (Addendum)
Pt c/o sudden, intermittent, RLQ abdominal pain starting at 0200.  Pain score 10/10. Denies n/v/d.  Pain seems to be positional.  Pt sts "it feels like something is cutting through me."  Pt reports that she fell x 4 days ago and was not seen by a provider.  Family Hx of colon cancer.

## 2013-06-03 LAB — URINE CULTURE: Colony Count: 3000

## 2013-06-08 NOTE — ED Provider Notes (Signed)
Medical screening examination/treatment/procedure(s) were performed by non-physician practitioner and as supervising physician I was immediately available for consultation/collaboration.   EKG Interpretation None        Tanna Furry, MD 06/08/13 [id number]

## 2013-11-04 ENCOUNTER — Other Ambulatory Visit: Payer: Self-pay | Admitting: Internal Medicine

## 2013-11-04 DIAGNOSIS — R4182 Altered mental status, unspecified: Secondary | ICD-10-CM

## 2013-11-07 ENCOUNTER — Ambulatory Visit (HOSPITAL_COMMUNITY)
Admission: RE | Admit: 2013-11-07 | Discharge: 2013-11-07 | Disposition: A | Discharge status: Home / Self Care | Payer: Medicare Other | Source: Ambulatory Visit | Attending: Vascular Surgery | Admitting: Vascular Surgery

## 2013-11-07 ENCOUNTER — Other Ambulatory Visit (HOSPITAL_COMMUNITY): Payer: Self-pay | Admitting: Internal Medicine

## 2013-11-07 ENCOUNTER — Ambulatory Visit
Admission: RE | Admit: 2013-11-07 | Discharge: 2013-11-07 | Disposition: A | Discharge status: Home / Self Care | Payer: Medicare Other | Source: Ambulatory Visit | Attending: Internal Medicine | Admitting: Internal Medicine

## 2013-11-07 DIAGNOSIS — R4182 Altered mental status, unspecified: Secondary | ICD-10-CM

## 2013-11-07 DIAGNOSIS — G459 Transient cerebral ischemic attack, unspecified: Secondary | ICD-10-CM | POA: Diagnosis present

## 2013-12-09 DIAGNOSIS — C541 Malignant neoplasm of endometrium: Secondary | ICD-10-CM

## 2013-12-09 HISTORY — DX: Malignant neoplasm of endometrium: C54.1

## 2013-12-16 ENCOUNTER — Other Ambulatory Visit: Payer: Self-pay | Admitting: Obstetrics and Gynecology

## 2013-12-17 ENCOUNTER — Encounter (HOSPITAL_COMMUNITY)
Admission: RE | Admit: 2013-12-17 | Discharge: 2013-12-17 | Disposition: A | Discharge status: Home / Self Care | Payer: Medicare Other | Source: Ambulatory Visit | Attending: Obstetrics and Gynecology | Admitting: Obstetrics and Gynecology

## 2013-12-17 ENCOUNTER — Encounter (HOSPITAL_COMMUNITY): Payer: Self-pay | Admitting: Pharmacist

## 2013-12-17 ENCOUNTER — Encounter (HOSPITAL_COMMUNITY): Payer: Self-pay

## 2013-12-17 DIAGNOSIS — K219 Gastro-esophageal reflux disease without esophagitis: Secondary | ICD-10-CM | POA: Diagnosis not present

## 2013-12-17 DIAGNOSIS — N950 Postmenopausal bleeding: Secondary | ICD-10-CM | POA: Diagnosis not present

## 2013-12-17 DIAGNOSIS — I10 Essential (primary) hypertension: Secondary | ICD-10-CM | POA: Diagnosis not present

## 2013-12-17 HISTORY — DX: Asymptomatic varicose veins of unspecified lower extremity: I83.90

## 2013-12-17 LAB — BASIC METABOLIC PANEL
Anion gap: 10 (ref 5–15)
BUN: 31 mg/dL — ABNORMAL HIGH (ref 6–23)
CHLORIDE: 104 meq/L (ref 96–112)
CO2: 25 mEq/L (ref 19–32)
Calcium: 9.6 mg/dL (ref 8.4–10.5)
Creatinine, Ser: 1.27 mg/dL — ABNORMAL HIGH (ref 0.50–1.10)
GFR calc Af Amer: 46 mL/min — ABNORMAL LOW (ref >90)
GFR, EST NON AFRICAN AMERICAN: 39 mL/min — AB (ref >90)
Glucose, Bld: 128 mg/dL — ABNORMAL HIGH (ref 70–99)
POTASSIUM: 4.3 meq/L (ref 3.7–5.3)
SODIUM: 139 meq/L (ref 137–147)

## 2013-12-17 LAB — CBC
HCT: 35.4 % — ABNORMAL LOW (ref 36.0–46.0)
HEMOGLOBIN: 11.7 g/dL — AB (ref 12.0–15.0)
MCH: 30.2 pg (ref 26.0–34.0)
MCHC: 33.1 g/dL (ref 30.0–36.0)
MCV: 91.5 fL (ref 78.0–100.0)
PLATELETS: 252 10*3/uL (ref 150–400)
RBC: 3.87 MIL/uL (ref 3.87–5.11)
RDW: 13.5 % (ref 11.5–15.5)
WBC: 7 10*3/uL (ref 4.0–10.5)

## 2013-12-17 NOTE — Patient Instructions (Addendum)
   Your procedure is scheduled on:  Thursday, Sept 10  Enter through the [employer] [facility] at:  [place] up the phone at the desk and dial [phone] and inform us of your arrival.  Please call this number if you have any problems the morning of surgery: [phone]  Remember: Do not eat or drink after midnight: Tonight - Wednesday Take these medicines the morning of surgery with a SIP OF WATER: diovan  Do not wear jewelry, make-up, or FINGER nail polish No metal in your hair or on your body. Do not wear lotions, powders, perfumes.  You may wear deodorant.  Do not bring valuables to the hospital. Contacts, dentures or bridgework may not be worn into surgery.  Patients discharged on the day of surgery will not be allowed to drive home.   Home with niece [name].

## 2013-12-17 NOTE — H&P (Signed)
  80 year old white female referred because of possible post menopausal bleeding. Pap smear was normal but gyn exam limited due to virginal introitus. Ultrasound did reveal a collection of fliud in the uterine cavity and there for D&c and endometrial biopsy were felt to be indicated. Biopsy would not have been possible in office so she now presents for this evaluation under anesthesia.  Allergies: Vioxx ,   Cipro,   Pravistatin,   Cephalosporins,     Benedryl  Medications: Tylenol;  Valsartan  PMH: See nurses notes  ROS:  Resp: No cough, SOB or hemoptasis GI: Heart burn, constipation, and pain secondary to diverticular disease GU: NO dysuria, frequency or urgency Gyn: No discharge or itch. Positive bleeding  Physical Exam:  BP: 134/65     Afebrile    Pulse 63,  Respirations 16  HEENT: PERLA Chest Clear to P&A Abdomen: soft without enlargement of liver kidneys or spleen Heart: regular rhythm no gallop Abdomen: Soft without enlargement of liver kidneys or spleen  Pelvic exam:  External genitalia: without lesion BUS: WNL Vagina: no lesions noted exam however limited Cervix and uterus( not enlarged on ultrasound. Fluid in cavity.  Impresson: Post menopausal bleeding  Plan; EUA          D&C and possible hysterography  Risk and benefits were discussed and all questions posed were answered.

## 2013-12-18 ENCOUNTER — Encounter (HOSPITAL_COMMUNITY): Payer: Medicare Other | Admitting: Anesthesiology

## 2013-12-18 ENCOUNTER — Encounter (HOSPITAL_COMMUNITY)
Admission: RE | Disposition: A | Discharge status: Home / Self Care | Payer: Self-pay | Source: Ambulatory Visit | Attending: Obstetrics and Gynecology

## 2013-12-18 ENCOUNTER — Ambulatory Visit (HOSPITAL_COMMUNITY): Payer: Medicare Other | Admitting: Anesthesiology

## 2013-12-18 ENCOUNTER — Encounter (HOSPITAL_COMMUNITY): Payer: Self-pay | Admitting: *Deleted

## 2013-12-18 ENCOUNTER — Ambulatory Visit (HOSPITAL_COMMUNITY)
Admission: RE | Admit: 2013-12-18 | Discharge: 2013-12-18 | Disposition: A | Discharge status: Home / Self Care | Payer: Medicare Other | Source: Ambulatory Visit | Attending: Obstetrics and Gynecology | Admitting: Obstetrics and Gynecology

## 2013-12-18 DIAGNOSIS — I10 Essential (primary) hypertension: Secondary | ICD-10-CM | POA: Insufficient documentation

## 2013-12-18 DIAGNOSIS — K219 Gastro-esophageal reflux disease without esophagitis: Secondary | ICD-10-CM | POA: Diagnosis not present

## 2013-12-18 DIAGNOSIS — N950 Postmenopausal bleeding: Secondary | ICD-10-CM | POA: Insufficient documentation

## 2013-12-18 HISTORY — PX: HYSTEROSCOPY W/D&C: SHX1775

## 2013-12-18 SURGERY — DILATATION AND CURETTAGE /HYSTEROSCOPY
Anesthesia: General | Site: Uterus

## 2013-12-18 MED ORDER — LACTATED RINGERS IV SOLN
INTRAVENOUS | Status: DC
  Administered 2013-12-18: 12:00:00 via INTRAVENOUS

## 2013-12-18 MED ORDER — PROPOFOL 10 MG/ML IV EMUL
INTRAVENOUS | Status: AC
  Filled 2013-12-18: qty 20

## 2013-12-18 MED ORDER — FENTANYL CITRATE 0.05 MG/ML IJ SOLN
INTRAMUSCULAR | Status: DC | PRN
  Administered 2013-12-18: 50 ug via INTRAVENOUS
  Administered 2013-12-18 (×2): 25 ug via INTRAVENOUS

## 2013-12-18 MED ORDER — FENTANYL CITRATE 0.05 MG/ML IJ SOLN
INTRAMUSCULAR | Status: AC
  Filled 2013-12-18: qty 2

## 2013-12-18 MED ORDER — DEXAMETHASONE SODIUM PHOSPHATE 10 MG/ML IJ SOLN
INTRAMUSCULAR | Status: AC
  Filled 2013-12-18: qty 1

## 2013-12-18 MED ORDER — SCOPOLAMINE 1 MG/3DAYS TD PT72: 1.0000 | MEDICATED_PATCH | Freq: Once | TRANSDERMAL | Status: DC

## 2013-12-18 MED ORDER — GLYCOPYRROLATE 0.2 MG/ML IJ SOLN
INTRAMUSCULAR | Status: DC | PRN
  Administered 2013-12-18: 0.2 mg via INTRAVENOUS

## 2013-12-18 MED ORDER — MEPERIDINE HCL 25 MG/ML IJ SOLN: 6.2500 mg | INTRAMUSCULAR | Status: DC | PRN

## 2013-12-18 MED ORDER — LIDOCAINE HCL 1 % IJ SOLN
INTRAMUSCULAR | Status: AC
  Filled 2013-12-18: qty 20

## 2013-12-18 MED ORDER — FENTANYL CITRATE 0.05 MG/ML IJ SOLN: 25.0000 ug | INTRAMUSCULAR | Status: DC | PRN

## 2013-12-18 MED ORDER — ONDANSETRON HCL 4 MG/2ML IJ SOLN
INTRAMUSCULAR | Status: AC
  Filled 2013-12-18: qty 2

## 2013-12-18 MED ORDER — LIDOCAINE HCL 1 % IJ SOLN
INTRAMUSCULAR | Status: DC | PRN
  Administered 2013-12-18: 10 mL

## 2013-12-18 MED ORDER — MIDAZOLAM HCL 2 MG/2ML IJ SOLN
INTRAMUSCULAR | Status: AC
  Filled 2013-12-18: qty 2

## 2013-12-18 MED ORDER — DEXAMETHASONE SODIUM PHOSPHATE 4 MG/ML IJ SOLN
INTRAMUSCULAR | Status: DC | PRN
  Administered 2013-12-18: 10 mg via INTRAVENOUS

## 2013-12-18 MED ORDER — ONDANSETRON HCL 4 MG/2ML IJ SOLN
INTRAMUSCULAR | Status: DC | PRN
  Administered 2013-12-18: 4 mg via INTRAVENOUS

## 2013-12-18 MED ORDER — PROPOFOL 10 MG/ML IV BOLUS
INTRAVENOUS | Status: DC | PRN
Start: 2013-12-18 — End: 2013-12-18
  Administered 2013-12-18: 50 mg via INTRAVENOUS
  Administered 2013-12-18: 100 mg via INTRAVENOUS

## 2013-12-18 MED ORDER — ONDANSETRON HCL 4 MG/2ML IJ SOLN: 4.0000 mg | Freq: Once | INTRAMUSCULAR | Status: DC | PRN

## 2013-12-18 MED ORDER — LIDOCAINE HCL (CARDIAC) 20 MG/ML IV SOLN
INTRAVENOUS | Status: AC
  Filled 2013-12-18: qty 5

## 2013-12-18 SURGICAL SUPPLY — 19 items
ABLATOR ENDOMETRIAL BIPOLAR (ABLATOR) IMPLANT
CANISTER SUCT 3000ML (MISCELLANEOUS) ×3 IMPLANT
CATH ROBINSON RED A/P 16FR (CATHETERS) ×3 IMPLANT
CLOTH BEACON ORANGE TIMEOUT ST (SAFETY) ×3 IMPLANT
CONTAINER PREFILL 10% NBF 60ML (FORM) ×6 IMPLANT
DRAPE HYSTEROSCOPY (DRAPE) ×3 IMPLANT
ELECT REM PT RETURN 9FT ADLT (ELECTROSURGICAL)
ELECTRODE REM PT RTRN 9FT ADLT (ELECTROSURGICAL) IMPLANT
GLOVE BIO SURGEON STRL SZ7.5 (GLOVE) ×3 IMPLANT
GLOVE BIOGEL PI IND STRL 7.5 (GLOVE) ×1 IMPLANT
GLOVE BIOGEL PI INDICATOR 7.5 (GLOVE) ×2
GOWN STRL REUS W/TWL LRG LVL3 (GOWN DISPOSABLE) ×6 IMPLANT
LOOP ANGLED CUTTING 22FR (CUTTING LOOP) IMPLANT
PACK VAGINAL MINOR WOMEN LF (CUSTOM PROCEDURE TRAY) ×3 IMPLANT
PAD OB MATERNITY 4.3X12.25 (PERSONAL CARE ITEMS) ×3 IMPLANT
SET TUBING HYSTEROSCOPY 2 NDL (TUBING) IMPLANT
TOWEL OR 17X24 6PK STRL BLUE (TOWEL DISPOSABLE) ×6 IMPLANT
TUBE HYSTEROSCOPY W Y-CONNECT (TUBING) IMPLANT
WATER STERILE IRR 1000ML POUR (IV SOLUTION) ×3 IMPLANT

## 2013-12-18 NOTE — Brief Op Note (Signed)
12/18/2013  12:25 PM  PATIENT:  [name]  80 y.o. female  PRE-OPERATIVE DIAGNOSIS:  POSTMENOPAUSAL BLEEDING  POST-OPERATIVE DIAGNOSIS:  POSTMENOPAUSAL BLEEDING  PROCEDURE:  Procedure(s): DILATATION AND CURETTAGE /HYSTEROSCOPY (N/A)  SURGEON:  Surgeon(s) and Role:    * Gus Height, MD - Primary     ANESTHESIA:   general  EBL:  Total I/O In: 500 [I.V.:500] Out: 100 [Urine:100]  BLOOD ADMINISTERED:none  DRAINS: none   LOCAL MEDICATIONS USED:  LIDOCAINE   SPECIMEN:  Source of Specimen:  endometrial currettings  DISPOSITION OF SPECIMEN:  PATHOLOGY  COUNTS:  YES  TOURNIQUET:  * No tourniquets in log *  DICTATION: .Other Dictation: Dictation Number [id number]  PLAN OF CARE: Discharge to home after PACU  PATIENT DISPOSITION:  PACU - hemodynamically stable.   Delay start of Pharmacological VTE agent (>24hrs) due to surgical blood loss or risk of bleeding: yes

## 2013-12-18 NOTE — Transfer of Care (Signed)
Immediate Anesthesia Transfer of Care Note  Patient: [name]  Procedure(s) Performed: Procedure(s): DILATATION AND CURETTAGE /HYSTEROSCOPY (N/A)  Patient Location: PACU  Anesthesia Type:General  Level of Consciousness: awake, alert  and oriented  Airway & Oxygen Therapy: Patient Spontanous Breathing  Post-op Assessment: Report given to PACU RN and Post -op Vital signs reviewed and stable  Post vital signs: Reviewed and stable  Complications: No apparent anesthesia complications

## 2013-12-18 NOTE — Discharge Instructions (Signed)

## 2013-12-18 NOTE — Anesthesia Postprocedure Evaluation (Signed)
  Anesthesia Post Note  Patient: [name]  Procedure(s) Performed: Procedure(s) (LRB): DILATATION AND CURETTAGE /HYSTEROSCOPY (N/A)  Anesthesia type: GA  Patient location: PACU  Post pain: Pain level controlled  Post assessment: Post-op Vital signs reviewed  Last Vitals:  Filed Vitals:   12/18/13 1315  BP: 113/59  Pulse: 59  Temp:   Resp: 16    Post vital signs: Reviewed  Level of consciousness: sedated  Complications: No apparent anesthesia complications

## 2013-12-18 NOTE — H&P (Signed)
  Status unchanged will proceed with planned D&C and possible hysteroscopy.

## 2013-12-18 NOTE — Op Note (Signed)
[name], [name]                 ACCOUNT NO.:  [id number]  MEDICAL RECORD NO.:  [id number]  LOCATION:  [facility]                          FACILITY:  [facility]  PHYSICIAN:  Gus Height, M.D.       DATE OF BIRTH:  Feb 27, 1933  DATE OF PROCEDURE:  12/18/2013 DATE OF DISCHARGE:  12/18/2013                              OPERATIVE REPORT   PREOPERATIVE DIAGNOSIS:  Postmenopausal bleeding.  POSTOPERATIVE DIAGNOSIS:  Postmenopausal bleeding.  PROCEDURE:  Examination under anesthesia.  Cervical dilatation, hysteroscopy, uterine curettage.  SURGEON:  Gus Height, M.D.  ANESTHESIA:  General.  COMPLICATIONS:  None.  JUSTIFICATION:  The patient is a 80 year old, white female with history of vaginal spotting.  Ultrasound examination in the office revealed a fluid collection in the uterus of approximately 3 mL.  Because of the patient's ____ status, it was not possible to do an endometrial biopsy in the office and she is being taken to the operating room at this time to undergo an exam under anesthesia as well as cervical dilatation, hysteroscopy, and uterine curettage.  The risks and benefits of the procedure were discussed with the patient, and informed consent has been obtained.  OPERATIVE PROCEDURE:  The patient was taken to the operating room, placed in supine position.  General anesthesia was administered without difficulty.  She was then placed in dorsal lithotomy position.  Prepped and draped in usual sterile fashion.  Bladder was catheterized.  Once this was done, examination under anesthesia revealed normal external genitalia.  Normal Bartholin's and Skene's gland.  The hymenal ring was annular and intact.  The vaginal vault was atrophic, but no lesions were otherwise noted.  The cervix had no gross lesions noted.  The uterus appeared to be normal size, anteriorly mobile.  No adnexal masses were found.  At this point, a speculum was placed in the vaginal vault.  The anterior cervical lip  grasped with a tenaculum and then dilated until a #25 Pratt dilator could be passed.  Once this was done, the diagnostic hysteroscope was placed through the endocervical canal and into the endometrial cavity.  On opening the cavity, a moderate amount of what appeared to be purulent fluid drained from the uterus.  After this was done, the hysteroscope was advanced into the cavity.  No gross lesions were noted.  There were no submucous myomas or polyps seen.  The lining appeared just grossly atrophic.  At this point, the scope was removed. Sharp vigorous curettage was carried out yielding only a very small amount of tissue, again consistent with the hysteroscopic findings of atrophy, and a small amount of tissue was collected and sent for histologic study.  Once this was completed, the tenaculum was removed. The cervix was injected with 10 mL of 1% Xylocaine for postop analgesia, and at this point, with good hemostasis.  The procedure was completed. The patient was reversed from anesthetic and brought to the recovery room in satisfactory condition.  Blood loss from procedure was minimal.  The patient tolerated the procedure well.  Went to recovery room in satisfactory condition.  Plan is for the patient to be discharged home.  Patient is instructed to call next Wednesday  for pathology report.  She is to call for any problems such as fever, pain, or heavy bleeding.     Gus Height, M.D.     AR/MEDQ  D:  12/18/2013  T:  12/18/2013  Job:  [id number]

## 2013-12-18 NOTE — Anesthesia Preprocedure Evaluation (Signed)
Anesthesia Evaluation  Patient identified by MRN, date of birth, ID band Patient awake    Reviewed: Allergy & Precautions, H&P , NPO status , Patient's Chart, lab work & pertinent test results  Airway Mallampati: I TM Distance: >3 FB Neck ROM: full    Dental no notable dental hx. (+) Teeth Intact   Pulmonary neg pulmonary ROS,    Pulmonary exam normal       Cardiovascular hypertension, Pt. on medications     Neuro/Psych    GI/Hepatic Neg liver ROS, GERD-  Controlled,  Endo/Other  negative endocrine ROS  Renal/GU negative Renal ROS     Musculoskeletal   Abdominal Normal abdominal exam  (+)   Peds  Hematology negative hematology ROS (+)   Anesthesia Other Findings   Reproductive/Obstetrics negative OB ROS                           Anesthesia Physical Anesthesia Plan  ASA: II  Anesthesia Plan: General   Post-op Pain Management:    Induction: Intravenous  Airway Management Planned: LMA  Additional Equipment:   Intra-op Plan:   Post-operative Plan:   Informed Consent: I have reviewed the patients History and Physical, chart, labs and discussed the procedure including the risks, benefits and alternatives for the proposed anesthesia with the patient or authorized representative who has indicated his/her understanding and acceptance.     Plan Discussed with: CRNA and Surgeon  Anesthesia Plan Comments:         Anesthesia Quick Evaluation

## 2013-12-19 ENCOUNTER — Encounter (HOSPITAL_COMMUNITY): Payer: Self-pay | Admitting: Obstetrics and Gynecology

## 2013-12-19 NOTE — Addendum Note (Signed)
Addendum created 12/19/13 1414 by Rudean Curt, MD   Modules edited: Anesthesia Responsible Staff

## 2013-12-30 NOTE — Addendum Note (Signed)
Addendum created 12/30/13 1940 by Rudean Curt, MD   Modules edited: Anesthesia Responsible Staff

## 2014-01-02 ENCOUNTER — Encounter: Payer: Self-pay | Admitting: Gynecologic Oncology

## 2014-01-02 ENCOUNTER — Ambulatory Visit: Payer: Medicare Other | Attending: Gynecologic Oncology | Admitting: Gynecologic Oncology

## 2014-01-02 DIAGNOSIS — I10 Essential (primary) hypertension: Secondary | ICD-10-CM | POA: Insufficient documentation

## 2014-01-02 DIAGNOSIS — Z800 Family history of malignant neoplasm of digestive organs: Secondary | ICD-10-CM | POA: Insufficient documentation

## 2014-01-02 DIAGNOSIS — Z8042 Family history of malignant neoplasm of prostate: Secondary | ICD-10-CM | POA: Diagnosis not present

## 2014-01-02 DIAGNOSIS — Z8673 Personal history of transient ischemic attack (TIA), and cerebral infarction without residual deficits: Secondary | ICD-10-CM | POA: Diagnosis not present

## 2014-01-02 DIAGNOSIS — Z85828 Personal history of other malignant neoplasm of skin: Secondary | ICD-10-CM | POA: Diagnosis not present

## 2014-01-02 DIAGNOSIS — C541 Malignant neoplasm of endometrium: Secondary | ICD-10-CM | POA: Insufficient documentation

## 2014-01-02 DIAGNOSIS — C55 Malignant neoplasm of uterus, part unspecified: Secondary | ICD-10-CM | POA: Insufficient documentation

## 2014-01-02 DIAGNOSIS — I2510 Atherosclerotic heart disease of native coronary artery without angina pectoris: Secondary | ICD-10-CM | POA: Insufficient documentation

## 2014-01-02 DIAGNOSIS — C549 Malignant neoplasm of corpus uteri, unspecified: Secondary | ICD-10-CM

## 2014-01-02 DIAGNOSIS — Z1504 Genetic susceptibility to malignant neoplasm of endometrium: Secondary | ICD-10-CM

## 2014-01-02 NOTE — Patient Instructions (Addendum)
Preparing for your Surgery   Plan for surgery on   Feb 17, 2014 with Dr.  Alycia Rossetti    Pre-operative Testing -You will receive a phone call from presurgical testing at [facility] to arrange for a pre-operative testing appointment before your surgery.  This appointment normally occurs one to two weeks before your scheduled surgery.   -Bring your insurance card, copy of an advanced directive if applicable, medication list  -At that visit, you will be asked to sign a consent for a possible blood transfusion in case a transfusion becomes necessary during surgery.  The need for a blood transfusion is rare but having consent is a necessary part of your care.     -You should not be taking blood thinners or aspirin at least ten days prior to surgery unless instructed by your surgeon.  Day Before Surgery at [place] will be asked to take in only clear liquids the day before surgery.  Examples of clear liquids include broths, jello, and clear juices.  You may also be advised to perform a Miralax bowel prep or fleets enema the night before your surgery based off of your provider's recommendations.  You will be advised to have nothing to eat or drink after midnight the evening before.    Your role in recovery Your role is to become active as soon as directed by your doctor, while still giving yourself time to heal.  Rest when you feel tired. You will be asked to do the following in order to speed your recovery:  - Cough and breathe deeply. This helps toclear and expand your lungs and can prevent pneumonia. You may be given a spirometer to practice deep breathing. A staff member will show you how to use the spirometer. - Do mild physical activity. Walking or moving your legs help your circulation and body functions return to normal. A staff member will help you when you try to walk and will provide you with simple exercises. Do not try to get up or walk alone the first time. -  Actively manage your pain. Managing your pain lets you move in comfort. We will ask you to rate your pain on a scale of zero to 10. It is your responsibility to tell your doctor or nurse where and how much you hurt so your pain can be treated.  Special Considerations -If you are diabetic, you may be placed on insulin after surgery to have closer control over your blood sugars to promote healing and recovery.  This does not mean that you will be discharged on insulin.  If applicable, your oral antidiabetics will be resumed when you are tolerating a solid diet.  -Your final pathology results from surgery should be available by the Friday after surgery and the results will be relayed to you when available.   Hysterectomy Information  A hysterectomy is a surgery in which your uterus is removed. This surgery may be done to treat various medical problems. After the surgery, you will no longer have menstrual periods. The surgery will also make you unable to become pregnant (sterile). The fallopian tubes and ovaries can be removed (bilateral salpingo-oophorectomy) during this surgery as well.  REASONS FOR A HYSTERECTOMY  Persistent, abnormal bleeding.  Lasting (chronic) pelvic pain or infection.  The lining of the uterus (endometrium) starts growing outside the uterus (endometriosis).  The endometrium starts growing in the muscle of the uterus (adenomyosis).  The uterus falls down into the vagina (pelvic organ prolapse).  Noncancerous  growths in the uterus (uterine fibroids) that cause symptoms.  Precancerous cells.  Cervical cancer or uterine cancer. TYPES OF HYSTERECTOMIES  Supracervical hysterectomy--In this type, the top part of the uterus is removed, but not the cervix.  Total hysterectomy--The uterus and cervix are removed.  Radical hysterectomy--The uterus, the cervix, and the fibrous tissue that holds the uterus in place in the pelvis (parametrium) are removed. WAYS A HYSTERECTOMY  CAN BE PERFORMED  Abdominal hysterectomy--A large surgical cut (incision) is made in the abdomen. The uterus is removed through this incision.  Vaginal hysterectomy--An incision is made in the vagina. The uterus is removed through this incision. There are no abdominal incisions.  Conventional laparoscopic hysterectomy--Three or four small incisions are made in the abdomen. A thin, lighted tube with a camera (laparoscope) is inserted into one of the incisions. Other tools are put through the other incisions. The uterus is cut into small pieces. The small pieces are removed through the incisions, or they are removed through the vagina.  Laparoscopically assisted vaginal hysterectomy (LAVH)--Three or four small incisions are made in the abdomen. Part of the surgery is performed laparoscopically and part vaginally. The uterus is removed through the vagina.  Robot-assisted laparoscopic hysterectomy--A laparoscope and other tools are inserted into 3 or 4 small incisions in the abdomen. A computer-controlled device is used to give the surgeon a 3D image and to help control the surgical instruments. This allows for more precise movements of surgical instruments. The uterus is cut into small pieces and removed through the incisions or removed through the vagina. RISKS AND COMPLICATIONS  Possible complications associated with this procedure include:  Bleeding and risk of blood transfusion. Tell your health care provider if you do not want to receive any blood products.  Blood clots in the legs or lung.  Infection.  Injury to surrounding organs.  Problems or side effects related to anesthesia.  Conversion to an abdominal hysterectomy from one of the other techniques. WHAT TO EXPECT AFTER A HYSTERECTOMY  You will be given pain medicine.  You will need to have someone with you for the first 3-5 days after you go home.  You will need to follow up with your surgeon in 2-4 weeks after surgery to  evaluate your progress.  You may have early menopause symptoms such as hot flashes, night sweats, and insomnia.  If you had a hysterectomy for a problem that was not cancer or not a condition that could lead to cancer, then you no longer need Pap tests. However, even if you no longer need a Pap test, a regular exam is a good idea to make sure no other problems are starting. Document Released: 09/20/2000 Document Revised: 01/15/2013 Document Reviewed: 12/02/2012 [facility] Patient Information 2015 [place], [place]. This information is not intended to replace advice given to you by your health care provider. Make sure you discuss any questions you have with your health care provider.

## 2014-01-02 NOTE — Progress Notes (Signed)
Consult Note: Gyn-Onc  Consult was requested by Dr. Gus Height for the evaluation of Verda Cumins 80 y.o. female with grade 2 endometrioid endometrial adenocarcinoma  CC: Dr Harle Battiest Chief Complaint  Patient presents with  . Cancer    Uterine cancer    Assessment/Plan:  Ms. JANESSA WUERTZ  is a 80 y.o. 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 She also has a concerning family history of colon cancer (sister, aunt and uncle) which raises the possiblity of an underlying Lynch syndrome diagnosis.   A detailed discussion was held with the patient and her family with regard to to her endometrial cancer diagnosis. We discussed the standard management options for uterine cancer which includes surgery followed possibly by adjuvant therapy depending on the results of surgery. The options for surgical management include a hysterectomy and removal of the tubes and ovaries possibly with removal of pelvic and para-aortic lymph nodes. A minimally invasive approach including a robotic hysterectomy or laparoscopic hysterectomy have benefits including shorter hospital stay, recovery time and better wound healing. The alternative approach is an open hysterectomy. The patient has been counseled about these surgical options and the risks of surgery in general including infection, bleeding, damage to surrounding structures (including bowel, bladder, ureters, nerves or vessels), and the postoperative risks of PE/ DVT, and lymphedema. I extensively reviewed the additional risks of robotic hysterectomy including possible need for conversion to open laparotomy.  I discussed positioning during surgery of trendelenberg and risks of minor facial swelling and care we take in preoperative positioning.  After counseling and consideration of her options, she desires to proceed with robotic hysterectomy, BSO, and lymphadenectomy.   She will be seen by anesthesia for  preoperative clearance and discussion of postoperative pain management.  She was given the opportunity to ask questions, which were answered to her satisfaction, and she is agreement with the above mentioned plan of care.  We will schedule her for a preoperative CT of the abdomen and pelvis as she has a moderately differentiated tumor to rule out occult large volume intraperitoneal or retroperitoneal metastatic disease.  She was counseled that given her age, she is likely to require some form of adjuvant therapy postop, pending final staging results.  HPI: Ms Majmudar is a 80 year old very healthy woman with a 4-6 month history of abnormal bloody vaginal discharge. She is nulliparous and virginal. She lives alone. She was seen by her gynecologist Dr Gus Height who performed a transvaginal US on 12/11/13 which showed a 6.6x3.8x3x7cm uterus with an endometrial thickness of 77mm. The ovaries were not visualized.  Dr Harrington Challenger performed a D&C on 12/18/13 which revealed moderately differentiated (FIGO grade II) endometrioid endometrial adenocarcinoma.  She has a strong family history of colon caner (sister, aunt and uncle). Her last colonoscopy revealed benign polyps.  She is virginal and nulliparous  Interval History: She has some mild cramping in the lower pelvis. Her bleeding is mild/spotting and stable. She denies lower extremity edema.  Current Meds:  Outpatient Encounter Prescriptions as of 01/02/2014  Medication Sig  . acetaminophen (TYLENOL) 325 MG tablet Take 1 tablet (325 mg total) by mouth every 6 (six) hours as needed for mild pain, moderate pain or fever.  . valsartan (DIOVAN) 80 MG tablet Take 80 mg by mouth daily.   . [DISCONTINUED] methylPREDNIsolone (MEDROL DOSPACK) 4 MG tablet     Allergy:  Allergies  Allergen Reactions  . Benadryl [Diphenhydramine Hcl (Sleep)] Other (See  Comments)    Light headed, heart racing.   . Ciprofloxacin Other (See Comments)    Leg pains  . Pravastatin Other  (See Comments)    Leg cramps  . Vioxx [Rofecoxib] Nausea Only  . Cephalosporins Rash    Social Hx:   History   Social History  . Marital Status: Single    Spouse Name: N/A    Number of Children: 0  . Years of Education: N/A   Occupational History  . retired    Social History Main Topics  . Smoking status: Never Smoker   . Smokeless tobacco: Never Used  . Alcohol Use: No  . Drug Use: No  . Sexual Activity: No   Other Topics Concern  . Not on file   Social History Narrative  . No narrative on file    Past Surgical Hx:  Past Surgical History  Procedure Laterality Date  . Tonsillectomy and adenoidectomy  1958  . Lumbar disc surgery  1998    right L1-L2  . Cholecystectomy, laparoscopic  2002  . Rotator cuff repair  2003    bilateral  . Tendon graft  2003    with bone graft  . Skin biopsy      four times last 2005 - nose cancer  . Cataract extraction  2005    bilateral  . Cataract extraction w/ intraocular lens  implant, bilateral    . Familiar tremor    . Cholecystectomy    . Wisdom tooth extraction    . Eye surgery    . Colonoscopy    . Hysteroscopy w/d&c N/A 12/18/2013    Procedure: DILATATION AND CURETTAGE /HYSTEROSCOPY;  Surgeon: Gus Height, MD;  Location: [facility];  Service: Gynecology;  Laterality: N/A;    Past Medical Hx:  Past Medical History  Diagnosis Date  . Diverticulosis of colon (without mention of hemorrhage)   . Unspecified gastritis and gastroduodenitis without mention of hemorrhage   . Basal cell carcinoma of nose   . Hypertension   . Hypercholesterolemia     diet controlled  . TIA (transient ischemic attack)     years ago  . Colitis, ischemic   . CAD (coronary artery disease)   . Esophageal stricture   . History of gallstones   . History of kidney stones     passed stones no surgery required  . Obesity   . Familial tremor   . Varicose veins     lower legs  . Confusion     age related, denies dementia  . Neuromuscular disorder      bilateral hand temor -   . Arthritis     hands    Past Gynecological History:  Virginal, nulliparous, menopause in her 21's  No LMP recorded. Patient is postmenopausal.  Family Hx:  Family History  Problem Relation Age of Onset  . Heart disease Sister   . Diabetes Brother     x 4  . Colon cancer Sister   . Crohn's disease      nephew  . Colon cancer Maternal Aunt   . Colon cancer Maternal Uncle   . Heart disease Brother   . Diabetes Sister     x 2  . Prostate cancer Brother   . Colon polyps      nephew  . Leukemia Sister     Review of Systems:  Constitutional  Feels well,    ENT Normal appearing ears and nares bilaterally Skin/Breast  No rash, sores, jaundice, itching, dryness  Cardiovascular  No chest pain, shortness of breath, or edema  Pulmonary  No cough or wheeze.  Gastro Intestinal  No nausea, vomitting, or diarrhoea. No bright red blood per rectum, no abdominal pain, change in bowel movement, or constipation.  Genito Urinary  No frequency, urgency, dysuria, see HPI Musculo Skeletal  No myalgia, arthralgia, joint swelling or pain  Neurologic  No weakness, numbness, change in gait,  Psychology  No depression, anxiety, insomnia.   Vitals:  There were no vitals taken for this visit.  Physical Exam: WD in NAD Neck  Supple NROM, without any enlargements.  Lymph Node Survey No cervical supraclavicular or inguinal adenopathy Cardiovascular  Pulse normal rate, regularity and rhythm. S1 and S2 normal.  Lungs  Clear to auscultation bilateraly, without wheezes/crackles/rhonchi. Good air movement.  Skin  No rash/lesions/breakdown  Psychiatry  Alert and oriented to person, place, and time  Abdomen  Normoactive bowel sounds, abdomen soft, non-tender and overweight (mildly) without evidence of hernia.  Back No CVA tenderness Genito Urinary  Vulva/vagina: Normal external female genitalia.   No lesions. No discharge or bleeding.  Bladder/urethra:  No  lesions or masses, well supported bladder  Vagina: atrophic, normal without lesions  Cervix: Normal appearing, no lesions.  Uterus: Small, mobile, no parametrial involvement or nodularity.  Adnexa: no palpable masses. Rectal  Good tone, no masses no cul de sac nodularity.  Extremities  No bilateral cyanosis, clubbing or edema.   Donaciano Eva, MD   01/02/2014, 11:50 AM

## 2014-01-06 ENCOUNTER — Ambulatory Visit (HOSPITAL_COMMUNITY)
Admission: RE | Admit: 2014-01-06 | Discharge: 2014-01-06 | Disposition: A | Discharge status: Home / Self Care | Payer: Medicare Other | Source: Ambulatory Visit | Attending: Gynecologic Oncology | Admitting: Gynecologic Oncology

## 2014-01-06 ENCOUNTER — Encounter (HOSPITAL_COMMUNITY): Payer: Self-pay

## 2014-01-06 DIAGNOSIS — I2510 Atherosclerotic heart disease of native coronary artery without angina pectoris: Secondary | ICD-10-CM | POA: Diagnosis not present

## 2014-01-06 DIAGNOSIS — R1031 Right lower quadrant pain: Secondary | ICD-10-CM | POA: Insufficient documentation

## 2014-01-06 DIAGNOSIS — N8189 Other female genital prolapse: Secondary | ICD-10-CM | POA: Diagnosis not present

## 2014-01-06 DIAGNOSIS — C549 Malignant neoplasm of corpus uteri, unspecified: Secondary | ICD-10-CM | POA: Insufficient documentation

## 2014-01-06 DIAGNOSIS — N898 Other specified noninflammatory disorders of vagina: Secondary | ICD-10-CM | POA: Diagnosis present

## 2014-01-06 DIAGNOSIS — C541 Malignant neoplasm of endometrium: Secondary | ICD-10-CM

## 2014-01-06 MED ORDER — IOHEXOL 300 MG/ML  SOLN
80.0000 mL | Freq: Once | INTRAMUSCULAR | Status: AC | PRN
  Administered 2014-01-06: 80 mL via INTRAVENOUS

## 2014-01-06 NOTE — Addendum Note (Signed)
Addendum created 01/06/14 0716 by Rudean Curt, MD   Modules edited: Anesthesia Responsible Staff

## 2014-01-07 ENCOUNTER — Telehealth: Payer: Self-pay | Admitting: *Deleted

## 2014-01-07 NOTE — Telephone Encounter (Signed)
Unable to reach pt. Will attempt again later.

## 2014-01-07 NOTE — Telephone Encounter (Signed)
Message copied by Azalea Cedar, Aletha Halim on Wed Jan 07, 2014  1:26 PM ------      Message from: Thereasa Solo      Created: Tue Jan 06, 2014  2:19 PM       Stanton Kidney,      Could we please let Ms Kovalcik know that her CT scan was normal and did not show any evidence of large spread of the cancer.      No change in surgical plan.      If she is to have surgery at [facility] can we make sure they have a copy of this? Thanks!      Terrence Dupont ------

## 2014-01-12 NOTE — Telephone Encounter (Signed)
Notified pt CT scan normal.

## 2014-02-06 ENCOUNTER — Encounter (HOSPITAL_COMMUNITY): Payer: Self-pay | Admitting: Pharmacy Technician

## 2014-02-10 NOTE — Patient Instructions (Addendum)
[name]  02/10/2014   Your procedure is scheduled on: 02/17/2014     Come thru the [facility]. .  Follow the Signs to [facility] at   [facility]     am  Call this number if you have problems the morning of surgery: [phone]   Remember: Clear liquid diet starting on Monday, 02/16/2014 am     CLEAR LIQUID DIET   Foods Allowed                                                                     Foods Excluded  Coffee and tea, regular and decaf                             liquids that you cannot  Plain Jell-O in any flavor                                             see through such as: Fruit ices (not with fruit pulp)                                     milk, soups, orange juice  Iced Popsicles                                    All solid food Carbonated beverages, regular and diet                                    Cranberry, grape and apple juices Sports drinks like Gatorade Lightly seasoned clear broth or consume(fat free) Sugar, honey syrup  Sample Menu Breakfast                                Lunch                                     Supper Cranberry juice                    Beef broth                            Chicken broth Jell-O                                     Grape juice                           Apple juice Coffee or tea  Jell-O                                      Popsicle                                                Coffee or tea                        Coffee or tea  _____________________________________________________________________     Do not eat food or drink liquids after midnight.   Take these medicines the morning of surgery with A SIP OF WATER: none    Do not wear jewelry, make-up or nail polish.  Do not wear lotions, powders, or perfumes.  deodorant.  Do not shave 48 hours prior to surgery.   Do not bring valuables to the hospital.  Contacts, dentures or bridgework may not be worn into surgery.  Leave  suitcase in the car. After surgery it may be brought to your room.  For patients admitted to the hospital, checkout time is 11:00 AM the day of  discharge.          Please read over the following fact sheets that you were given:  coughing and deep breathing exercises, leg exercises            [facility] - Preparing for Surgery Before surgery, you can play an important role.  Because skin is not sterile, your skin needs to be as free of germs as possible.  You can reduce the number of germs on your skin by washing with CHG (chlorahexidine gluconate) soap before surgery.  CHG is an antiseptic cleaner which kills germs and bonds with the skin to continue killing germs even after washing. Please DO NOT use if you have an allergy to CHG or antibacterial soaps.  If your skin becomes reddened/irritated stop using the CHG and inform your nurse when you arrive at Short Stay. Do not shave (including legs and underarms) for at least 48 hours prior to the first CHG shower.  You may shave your face/neck. Please follow these instructions carefully:  1.  Shower with CHG Soap the night before surgery and the  morning of Surgery.  2.  If you choose to wash your hair, wash your hair first as usual with your  normal  shampoo.  3.  After you shampoo, rinse your hair and body thoroughly to remove the  shampoo.                           4.  Use CHG as you would any other liquid soap.  You can apply chg directly  to the skin and wash                       Gently with a scrungie or clean washcloth.  5.  Apply the CHG Soap to your body ONLY FROM THE NECK DOWN.   Do not use on face/ open                           Wound or open sores. Avoid contact with eyes, ears mouth and genitals (private parts).  Wash face,  Genitals (private parts) with your normal soap.             6.  Wash thoroughly, paying special attention to the area where your surgery  will be performed.  7.  Thoroughly rinse your body  with warm water from the neck down.  8.  DO NOT shower/wash with your normal soap after using and rinsing off  the CHG Soap.                9.  Pat yourself dry with a clean towel.            10.  Wear clean pajamas.            11.  Place clean sheets on your bed the night of your first shower and do not  sleep with pets. Day of Surgery : Do not apply any lotions/deodorants the morning of surgery.  Please wear clean clothes to the hospital/surgery center.  FAILURE TO FOLLOW THESE INSTRUCTIONS MAY RESULT IN THE CANCELLATION OF YOUR SURGERY PATIENT SIGNATURE_________________________________  NURSE SIGNATURE__________________________________  ________________________________________________________________________  WHAT IS A BLOOD TRANSFUSION? Blood Transfusion Information  A transfusion is the replacement of blood or some of its parts. Blood is made up of multiple cells which provide different functions.  Red blood cells carry oxygen and are used for blood loss replacement.  White blood cells fight against infection.  Platelets control bleeding.  Plasma helps clot blood.  Other blood products are available for specialized needs, such as hemophilia or other clotting disorders. BEFORE THE TRANSFUSION  Who gives blood for transfusions?   Healthy volunteers who are fully evaluated to make sure their blood is safe. This is blood bank blood. Transfusion therapy is the safest it has ever been in the practice of medicine. Before blood is taken from a donor, a complete history is taken to make sure that person has no history of diseases nor engages in risky social behavior (examples are intravenous drug use or sexual activity with multiple partners). The donor's travel history is screened to minimize risk of transmitting infections, such as malaria. The donated blood is tested for signs of infectious diseases, such as HIV and hepatitis. The blood is then tested to be sure it is compatible with  you in order to minimize the chance of a transfusion reaction. If you or a relative donates blood, this is often done in anticipation of surgery and is not appropriate for emergency situations. It takes many days to process the donated blood. RISKS AND COMPLICATIONS Although transfusion therapy is very safe and saves many lives, the main dangers of transfusion include:  1. Getting an infectious disease. 2. Developing a transfusion reaction. This is an allergic reaction to something in the blood you were given. Every precaution is taken to prevent this. The decision to have a blood transfusion has been considered carefully by your caregiver before blood is given. Blood is not given unless the benefits outweigh the risks. AFTER THE TRANSFUSION  Right after receiving a blood transfusion, you will usually feel much better and more energetic. This is especially true if your red blood cells have gotten low (anemic). The transfusion raises the level of the red blood cells which carry oxygen, and this usually causes an energy increase.  The nurse administering the transfusion will monitor you carefully for complications. HOME CARE INSTRUCTIONS  No special instructions are needed after a transfusion. You may find your energy is better. Speak with your caregiver about any  limitations on activity for underlying diseases you may have. SEEK MEDICAL CARE IF:   Your condition is not improving after your transfusion.  You develop redness or irritation at the intravenous (IV) site. SEEK IMMEDIATE MEDICAL CARE IF:  Any of the following symptoms occur over the next 12 hours:  Shaking chills.  You have a temperature by mouth above 102 F (38.9 C), not controlled by medicine.  Chest, back, or muscle pain.  People around you feel you are not acting correctly or are confused.  Shortness of breath or difficulty breathing.  Dizziness and fainting.  You get a rash or develop hives.  You have a decrease in  urine output.  Your urine turns a dark color or changes to pink, red, or brown. Any of the following symptoms occur over the next 10 days:  You have a temperature by mouth above 102 F (38.9 C), not controlled by medicine.  Shortness of breath.  Weakness after normal activity.  The white part of the eye turns yellow (jaundice).  You have a decrease in the amount of urine or are urinating less often.  Your urine turns a dark color or changes to pink, red, or brown. Document Released: 03/24/2000 Document Revised: 06/19/2011 Document Reviewed: 11/11/2007 ExitCare Patient Information 2014 [place].  _______________________________________________________________________  Incentive Spirometer  An incentive spirometer is a tool that can help keep your lungs clear and active. This tool measures how well you are filling your lungs with each breath. Taking long deep breaths may help reverse or decrease the chance of developing breathing (pulmonary) problems (especially infection) following:  A long period of time when you are unable to move or be active. BEFORE THE PROCEDURE   If the spirometer includes an indicator to show your best effort, your nurse or respiratory therapist will set it to a desired goal.  If possible, sit up straight or lean slightly forward. Try not to slouch.  Hold the incentive spirometer in an upright position. INSTRUCTIONS FOR USE  3. Sit on the edge of your bed if possible, or sit up as far as you can in bed or on a chair. 4. Hold the incentive spirometer in an upright position. 5. Breathe out normally. 6. Place the mouthpiece in your mouth and seal your lips tightly around it. 7. Breathe in slowly and as deeply as possible, raising the piston or the ball toward the top of the column. 8. Hold your breath for 3-5 seconds or for as long as possible. Allow the piston or ball to fall to the bottom of the column. 9. Remove the mouthpiece from your mouth and  breathe out normally. 10. Rest for a few seconds and repeat Steps 1 through 7 at least 10 times every 1-2 hours when you are awake. Take your time and take a few normal breaths between deep breaths. 11. The spirometer may include an indicator to show your best effort. Use the indicator as a goal to work toward during each repetition. 12. After each set of 10 deep breaths, practice coughing to be sure your lungs are clear. If you have an incision (the cut made at the time of surgery), support your incision when coughing by placing a pillow or rolled up towels firmly against it. Once you are able to get out of bed, walk around indoors and cough well. You may stop using the incentive spirometer when instructed by your caregiver.  RISKS AND COMPLICATIONS  Take your time so you do not get dizzy  or light-headed.  If you are in pain, you may need to take or ask for pain medication before doing incentive spirometry. It is harder to take a deep breath if you are having pain. AFTER USE  Rest and breathe slowly and easily.  It can be helpful to keep track of a log of your progress. Your caregiver can provide you with a simple table to help with this. If you are using the spirometer at home, follow these instructions: [place] IF:   You are having difficultly using the spirometer.  You have trouble using the spirometer as often as instructed.  Your pain medication is not giving enough relief while using the spirometer.  You develop fever of 100.5 F (38.1 C) or higher. SEEK IMMEDIATE MEDICAL CARE IF:   You cough up bloody sputum that had not been present before.  You develop fever of 102 F (38.9 C) or greater.  You develop worsening pain at or near the incision site. MAKE SURE YOU:   Understand these instructions.  Will watch your condition.  Will get help right away if you are not doing well or get worse. Document Released: 08/07/2006 Document Revised: 06/19/2011 Document  Reviewed: 10/08/2006 [facility] Patient Information 2014 [place], [place].   ________________________________________________________________________

## 2014-02-11 ENCOUNTER — Encounter: Payer: Self-pay | Admitting: Gynecologic Oncology

## 2014-02-11 ENCOUNTER — Encounter (HOSPITAL_COMMUNITY): Payer: Self-pay

## 2014-02-11 ENCOUNTER — Ambulatory Visit: Payer: Medicare Other | Attending: Gynecologic Oncology | Admitting: Gynecologic Oncology

## 2014-02-11 ENCOUNTER — Encounter (HOSPITAL_COMMUNITY)
Admission: RE | Admit: 2014-02-11 | Discharge: 2014-02-11 | Disposition: A | Discharge status: Home / Self Care | Payer: Medicare Other | Source: Ambulatory Visit | Attending: Cardiology | Admitting: Cardiology

## 2014-02-11 ENCOUNTER — Ambulatory Visit (HOSPITAL_COMMUNITY)
Admission: RE | Admit: 2014-02-11 | Discharge: 2014-02-11 | Disposition: A | Discharge status: Home / Self Care | Payer: Medicare Other | Source: Ambulatory Visit | Attending: Gynecologic Oncology | Admitting: Gynecologic Oncology

## 2014-02-11 VITALS — BP 129/70 | HR 66 | Temp 97.9°F | Resp 20 | Ht 61.0 in | Wt 144.4 lb

## 2014-02-11 DIAGNOSIS — Z881 Allergy status to other antibiotic agents status: Secondary | ICD-10-CM | POA: Insufficient documentation

## 2014-02-11 DIAGNOSIS — Z01811 Encounter for preprocedural respiratory examination: Secondary | ICD-10-CM | POA: Diagnosis not present

## 2014-02-11 DIAGNOSIS — Z01812 Encounter for preprocedural laboratory examination: Secondary | ICD-10-CM | POA: Diagnosis not present

## 2014-02-11 DIAGNOSIS — Z800 Family history of malignant neoplasm of digestive organs: Secondary | ICD-10-CM

## 2014-02-11 DIAGNOSIS — Z888 Allergy status to other drugs, medicaments and biological substances status: Secondary | ICD-10-CM | POA: Diagnosis not present

## 2014-02-11 DIAGNOSIS — C55 Malignant neoplasm of uterus, part unspecified: Secondary | ICD-10-CM | POA: Diagnosis not present

## 2014-02-11 DIAGNOSIS — Z79899 Other long term (current) drug therapy: Secondary | ICD-10-CM | POA: Diagnosis not present

## 2014-02-11 DIAGNOSIS — C541 Malignant neoplasm of endometrium: Secondary | ICD-10-CM | POA: Diagnosis present

## 2014-02-11 LAB — COMPREHENSIVE METABOLIC PANEL
ALK PHOS: 57 U/L (ref 39–117)
ALT: 8 U/L (ref 0–35)
AST: 17 U/L (ref 0–37)
Albumin: 3.6 g/dL (ref 3.5–5.2)
Anion gap: 11 (ref 5–15)
BUN: 25 mg/dL — ABNORMAL HIGH (ref 6–23)
CHLORIDE: 105 meq/L (ref 96–112)
CO2: 23 meq/L (ref 19–32)
Calcium: 9.9 mg/dL (ref 8.4–10.5)
Creatinine, Ser: 1.13 mg/dL — ABNORMAL HIGH (ref 0.50–1.10)
GFR calc Af Amer: 52 mL/min — ABNORMAL LOW (ref >90)
GFR, EST NON AFRICAN AMERICAN: 45 mL/min — AB (ref >90)
Glucose, Bld: 82 mg/dL (ref 70–99)
Potassium: 4.2 mEq/L (ref 3.7–5.3)
SODIUM: 139 meq/L (ref 137–147)
Total Bilirubin: 0.3 mg/dL (ref 0.3–1.2)
Total Protein: 6.9 g/dL (ref 6.0–8.3)

## 2014-02-11 LAB — CBC WITH DIFFERENTIAL/PLATELET
Basophils Absolute: 0 10*3/uL (ref 0.0–0.1)
Basophils Relative: 0 % (ref 0–1)
Eosinophils Absolute: 0.1 10*3/uL (ref 0.0–0.7)
Eosinophils Relative: 2 % (ref 0–5)
HCT: 35.1 % — ABNORMAL LOW (ref 36.0–46.0)
Hemoglobin: 11.4 g/dL — ABNORMAL LOW (ref 12.0–15.0)
LYMPHS PCT: 28 % (ref 12–46)
Lymphs Abs: 1.4 10*3/uL (ref 0.7–4.0)
MCH: 29.8 pg (ref 26.0–34.0)
MCHC: 32.5 g/dL (ref 30.0–36.0)
MCV: 91.9 fL (ref 78.0–100.0)
MONO ABS: 0.6 10*3/uL (ref 0.1–1.0)
MONOS PCT: 11 % (ref 3–12)
NEUTROS ABS: 2.9 10*3/uL (ref 1.7–7.7)
NEUTROS PCT: 59 % (ref 43–77)
Platelets: 190 10*3/uL (ref 150–400)
RBC: 3.82 MIL/uL — ABNORMAL LOW (ref 3.87–5.11)
RDW: 13.7 % (ref 11.5–15.5)
WBC: 5 10*3/uL (ref 4.0–10.5)

## 2014-02-11 LAB — URINALYSIS, ROUTINE W REFLEX MICROSCOPIC
BILIRUBIN URINE: NEGATIVE
Glucose, UA: NEGATIVE mg/dL
HGB URINE DIPSTICK: NEGATIVE
Ketones, ur: NEGATIVE mg/dL
LEUKOCYTES UA: NEGATIVE
NITRITE: NEGATIVE
PH: 5 (ref 5.0–8.0)
Protein, ur: NEGATIVE mg/dL
Specific Gravity, Urine: 1.019 (ref 1.005–1.030)
UROBILINOGEN UA: 0.2 mg/dL (ref 0.0–1.0)

## 2014-02-11 LAB — ABO/RH: ABO/RH(D): B POS

## 2014-02-11 NOTE — Progress Notes (Signed)
EKG- 12/17/2013 EPIC

## 2014-02-11 NOTE — Progress Notes (Signed)
Consult Note: Gyn-Onc  Consult was requested by Dr. Gus Height for the evaluation of [name] 80 y.o. female with grade 2 endometrioid endometrial adenocarcinoma  CC: Dr Harle Battiest Chief Complaint  Patient presents with  . Endometrial Cancer    Assessment/Plan:  [name]  is a 80 y.o. 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 She also has a concerning family history of colon cancer (sister, aunt and uncle) which raises the possiblity of an underlying Lynch syndrome diagnosis.   A detailed discussion was held with the patient and her family by Dr. Denman George with regard to to her endometrial cancer diagnosis. We discussed the standard management options for uterine cancer which includes surgery followed possibly by adjuvant therapy depending on the results of surgery. The options for surgical management include a hysterectomy and removal of the tubes and ovaries possibly with removal of pelvic and para-aortic lymph nodes. A minimally invasive approach including a robotic hysterectomy or laparoscopic hysterectomy have benefits including shorter hospital stay, recovery time and better wound healing. The alternative approach is an open hysterectomy. The patient has been counseled about these surgical options and the risks of surgery in general including infection, bleeding, damage to surrounding structures (including bowel, bladder, ureters, nerves or vessels), and the postoperative risks of PE/ DVT, and lymphedema. I extensively reviewed the additional risks of robotic hysterectomy including possible need for conversion to open laparotomy.  I discussed positioning during surgery of trendelenberg and risks of minor facial swelling and care we take in preoperative positioning.  After counseling and consideration of her options, she desires to proceed with robotic hysterectomy, BSO, and lymphadenectomy.   CT of the abdomen and pelvis was  negative.  We discussed the need to proceed with microsatellite instability testing after surgery secondary to her family history. She is amenable to doing this would also be happy to see genetics should microsatellite instability testing be concerning for Lynch syndrome she be happy to donate blood and see genetics of the benefits her family.   HPI: [name] is a 80 year old very healthy woman with a 4-6 month history of abnormal bloody vaginal discharge. She is nulliparous and virginal. She lives alone. She was seen by her gynecologist Dr Gus Height who performed a transvaginal US on 12/11/13 which showed a 6.6x3.8x3x7cm uterus with an endometrial thickness of 21m. The ovaries were not visualized.  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  She has a strong family history of colon caner (sister, aunt and uncle). Her last colonoscopy revealed benign polyps.   She is virginal and nulliparous  Interval History: She has some mild cramping in the lower pelvis. Her bleeding is mild/spotting and stable. She denies lower extremity edema.  She and I discussed DO NOT RESUSCITATE and DO NOT INTUBATE status. I discussed with her that that would need to be held during the time of surgery and she is happy to do that. She does not currently have a DNR/DNI in effect but it is something that she has considered doing after her surgery.  Her CT scan reveals: EXAM: CT ABDOMEN AND PELVIS WITH CONTRAST  TECHNIQUE: Multidetector CT imaging of the abdomen and pelvis was performed using the standard protocol following bolus administration of intravenous contrast.  CONTRAST: 875mOMNIPAQUE IOHEXOL 300 MG/ML SOLN  COMPARISON: 06/02/2013  FINDINGS: Lower chest: Clear lung bases. Mild cardiomegaly, accentuated by a pectus  excavatum deformity. Right coronary artery atherosclerosis. No pericardial or pleural  effusion.  Hepatobiliary: Normal liver. Cholecystectomy, without biliary ductal dilatation.  Pancreas: Normal, without mass or pancreatic ductal dilatation.  Spleen: Normal  Adrenals/Urinary Tract: Normal adrenal glands. Mild bilateral renal cortical atrophy. Bilateral multiple renal cysts and too small to characterize lesions. An interpolar left renal lesion measures fluid density centrally but demonstrates minimal increased density in its dependent portion on image 36 of series 2. 1.5 cm. No hydronephrosis. Normal urinary bladder.  Stomach/Bowel: Normal stomach, without wall thickening. A moderate size descending duodenal diverticulum. Extensive colonic diverticulosis with muscular hypertrophy involving the sigmoid. Normal terminal ileum. Otherwise normal appearance of the small bowel.  Vascular/Lymphatic: Atherosclerosis, within the aorta and at the origin of both renal arteries. No retroperitoneal or retrocrural adenopathy. No pelvic adenopathy.  Reproductive: Moderate pelvic floor laxity. Retroverted uterus. Central hypoattenuation with surrounding hyper enhancement involving the uterine fundus. This is retroverted, including on sagittal image 53. No gross extension outside of the uterus.  Other: No evidence of omental or peritoneal disease. No abdominal pelvic fluid.  Musculoskeletal: Degenerative sclerosis involving the symphysis pubis. Right hip osteoarthritis. Lumbar spondylosis.  IMPRESSION: 1. Uterine fundal hyper enhancement and central hypoattenuation, possibly representing the primary endometrial carcinoma. No gross evidence of extrauterine spread and no evidence of nodal metastasis. 2. Pelvic floor laxity. 3. Bilateral renal cortical thinning with presumed cysts and a minimally complex interpolar left renal cyst. 4. Atherosclerosis, including within the coronary arteries.  Current Meds:  Outpatient Encounter Prescriptions as of 02/11/2014  Medication Sig  .  valsartan (DIOVAN) 80 MG tablet Take 80 mg by mouth every morning.   Marland Kitchen acetaminophen (TYLENOL) 325 MG tablet Take 325-650 mg by mouth every 6 (six) hours as needed for mild pain or headache.  . [DISCONTINUED] ALPRAZolam (XANAX) 0.5 MG tablet   . [DISCONTINUED] methylPREDNIsolone (MEDROL DOSPACK) 4 MG tablet     Allergy:  Allergies  Allergen Reactions  . Benadryl [Diphenhydramine Hcl (Sleep)] Other (See Comments)    Light headed, heart racing.   . Ciprofloxacin Other (See Comments)    Leg pains  . Pravastatin Other (See Comments)    Leg cramps  . Vioxx [Rofecoxib] Nausea Only  . Cephalosporins Rash    Social Hx:   History   Social History  . Marital Status: Single    Spouse Name: N/A    Number of Children: 0  . Years of Education: N/A   Occupational History  . retired    Social History Main Topics  . Smoking status: Never Smoker   . Smokeless tobacco: Never Used  . Alcohol Use: No  . Drug Use: No  . Sexual Activity: No   Other Topics Concern  . Not on file   Social History Narrative    Past Surgical Hx:  Past Surgical History  Procedure Laterality Date  . Tonsillectomy and adenoidectomy  1958  . Lumbar disc surgery  1998    right L1-L2  . Cholecystectomy, laparoscopic  2002  . Rotator cuff repair  2003    bilateral  . Tendon graft  2003    with bone graft  . Skin biopsy      four times last 2005 - nose cancer  . Cataract extraction  2005    bilateral  . Cataract extraction w/ intraocular lens  implant, bilateral    . Familiar tremor    . Cholecystectomy    . Wisdom tooth extraction    . Eye surgery    .  Colonoscopy    . Hysteroscopy w/d&c N/A 12/18/2013    Procedure: DILATATION AND CURETTAGE /HYSTEROSCOPY;  Surgeon: Gus Height, MD;  Location: [facility];  Service: Gynecology;  Laterality: N/A;    Past Medical Hx:  Past Medical History  Diagnosis Date  . Diverticulosis of colon (without mention of hemorrhage)   . Unspecified gastritis and  gastroduodenitis without mention of hemorrhage   . Hypertension   . Hypercholesterolemia     diet controlled  . TIA (transient ischemic attack)     years ago  . Colitis, ischemic   . CAD (coronary artery disease)   . Esophageal stricture   . History of gallstones   . History of kidney stones     passed stones no surgery required  . Obesity   . Familial tremor   . Varicose veins     lower legs  . Confusion     age related, denies dementia  . Neuromuscular disorder     bilateral hand temor -   . Arthritis     hands  . Basal cell carcinoma of nose   . Endometrial ca dx'd 12/2013    Past Gynecological History:  Virginal, nulliparous, menopause in her 5's  No LMP recorded. Patient is postmenopausal.  Family Hx:  Family History  Problem Relation Age of Onset  . Heart disease Sister   . Diabetes Brother     x 4  . Colon cancer Sister   . Crohn's disease      nephew  . Colon cancer Maternal Aunt   . Colon cancer Maternal Uncle   . Heart disease Brother   . Diabetes Sister     x 2  . Prostate cancer Brother   . Colon polyps      nephew  . Leukemia Sister      Vitals:  Blood pressure 129/70, pulse 66, temperature 97.9 F (36.6 C), temperature source Oral, resp. rate 20, height '5\' 1"'  (1.549 m), weight 144 lb 6.4 oz (65.499 kg).  Physical Exam:   Rowland Ericsson A., MD   02/11/2014, 1:33 PM

## 2014-02-11 NOTE — Patient Instructions (Addendum)
Preparing for your Surgery  Plan for surgery on Nov 10 with Dr. Alycia Rossetti.  Pre-operative Testing -You will receive a phone call from presurgical testing at [facility] to arrange for a pre-operative testing appointment before your surgery.  This appointment normally occurs one to two weeks before your scheduled surgery.   -Bring your insurance card, copy of an advanced directive if applicable, medication list  -At that visit, you will be asked to sign a consent for a possible blood transfusion in case a transfusion becomes necessary during surgery.  The need for a blood transfusion is rare but having consent is a necessary part of your care.     -You should not be taking blood thinners or aspirin at least ten days prior to surgery unless instructed by your surgeon.  Day Before Surgery at [place] will be asked to take in only clear liquids the day before surgery.  Examples of clear liquids include broths, jello, and clear juices.  You will be advised to have nothing to eat or drink after midnight the evening before.    Your role in recovery Your role is to become active as soon as directed by your doctor, while still giving yourself time to heal.  Rest when you feel tired. You will be asked to do the following in order to speed your recovery:  - Cough and breathe deeply. This helps toclear and expand your lungs and can prevent pneumonia. You may be given a spirometer to practice deep breathing. A staff member will show you how to use the spirometer. - Do mild physical activity. Walking or moving your legs help your circulation and body functions return to normal. A staff member will help you when you try to walk and will provide you with simple exercises. Do not try to get up or walk alone the first time. - Actively manage your pain. Managing your pain lets you move in comfort. We will ask you to rate your pain on a scale of zero to 10. It is your responsibility to tell  your doctor or nurse where and how much you hurt so your pain can be treated.  Special Considerations -If you are diabetic, you may be placed on insulin after surgery to have closer control over your blood sugars to promote healing and recovery.  This does not mean that you will be discharged on insulin.  If applicable, your oral antidiabetics will be resumed when you are tolerating a solid diet.  -Your final pathology results from surgery should be available by the Friday after surgery and the results will be relayed to you when available.    Total Laparoscopic Hysterectomy, Care After Refer to this sheet in the next few weeks. These instructions provide you with information on caring for yourself after your procedure. Your health care provider may also give you more specific instructions. Your treatment has been planned according to current medical practices, but problems sometimes occur. Call your health care provider if you have any problems or questions after your procedure. WHAT TO EXPECT AFTER THE PROCEDURE  Pain and bruising at the incision sites. You will be given pain medicine to control it.  Menopausal symptoms such as hot flashes, night sweats, and insomnia if your ovaries were removed.  Sore throat from the breathing tube that was inserted during surgery. HOME CARE INSTRUCTIONS  Only take over-the-counter or prescription medicines for pain, discomfort, or fever as directed by your health care provider.   Do not take aspirin.  It can cause bleeding.   Do not drive when taking pain medicine.   Follow your health care provider's advice regarding diet, exercise, lifting, driving, and general activities.   Resume your usual diet as directed and allowed.   Get plenty of rest and sleep.   Do not douche, use tampons, or have sexual intercourse for at least 6 weeks, or until your health care provider gives you permission.   Change your bandages (dressings) as directed by  your health care provider.   Monitor your temperature and notify your health care provider of a fever.   Take showers instead of baths for 2-3 weeks.   Do not drink alcohol until your health care provider gives you permission.   If you develop constipation, you may take a mild laxative with your health care provider's permission. Bran foods may help with constipation problems. Drinking enough fluids to keep your urine clear or pale yellow may help as well.   Try to have someone home with you for 1-2 weeks to help around the house.   Keep all of your follow-up appointments as directed by your health care provider.  SEEK MEDICAL CARE IF:  You have swelling, redness, or increasing pain around your incision sites.   You have pus coming from your incision.   You notice a bad smell coming from your incision.   Your incision breaks open.   You feel dizzy or lightheaded.   You have pain or bleeding when you urinate.   You have persistent diarrhea.   You have persistent nausea and vomiting.   You have abnormal vaginal discharge.   You have a rash.   You have any type of abnormal reaction or develop an allergy to your medicine.   You have poor pain control with your prescribed medicine.  SEEK IMMEDIATE MEDICAL CARE IF:  You have chest pain or shortness of breath.  You have severe abdominal pain that is not relieved with pain medicine.  You have pain or swelling in your legs. MAKE SURE YOU:  Understand these instructions.  Will watch your condition.  Will get help right away if you are not doing well or get worse. Document Released: 01/15/2013 Document Revised: 04/01/2013 Document Reviewed: 01/15/2013 [facility] Patient Information 2015 [place], [place]. This information is not intended to replace advice given to you by your health care provider. Make sure you discuss any questions you have with your health care provider.   Hysterectomy Information  A  hysterectomy is a surgery in which your uterus is removed. This surgery may be done to treat various medical problems. After the surgery, you will no longer have menstrual periods. The surgery will also make you unable to become pregnant (sterile). The fallopian tubes and ovaries can be removed (bilateral salpingo-oophorectomy) during this surgery as well.  REASONS FOR A HYSTERECTOMY  Persistent, abnormal bleeding.  Lasting (chronic) pelvic pain or infection.  The lining of the uterus (endometrium) starts growing outside the uterus (endometriosis).  The endometrium starts growing in the muscle of the uterus (adenomyosis).  The uterus falls down into the vagina (pelvic organ prolapse).  Noncancerous growths in the uterus (uterine fibroids) that cause symptoms.  Precancerous cells.  Cervical cancer or uterine cancer. TYPES OF HYSTERECTOMIES  Supracervical hysterectomy--In this type, the top part of the uterus is removed, but not the cervix.  Total hysterectomy--The uterus and cervix are removed.  Radical hysterectomy--The uterus, the cervix, and the fibrous tissue that holds the uterus in place in the pelvis (  parametrium) are removed. WAYS A HYSTERECTOMY CAN BE PERFORMED  Abdominal hysterectomy--A large surgical cut (incision) is made in the abdomen. The uterus is removed through this incision.  Vaginal hysterectomy--An incision is made in the vagina. The uterus is removed through this incision. There are no abdominal incisions.  Conventional laparoscopic hysterectomy--Three or four small incisions are made in the abdomen. A thin, lighted tube with a camera (laparoscope) is inserted into one of the incisions. Other tools are put through the other incisions. The uterus is cut into small pieces. The small pieces are removed through the incisions, or they are removed through the vagina.  Laparoscopically assisted vaginal hysterectomy (LAVH)--Three or four small incisions are made in the  abdomen. Part of the surgery is performed laparoscopically and part vaginally. The uterus is removed through the vagina.  Robot-assisted laparoscopic hysterectomy--A laparoscope and other tools are inserted into 3 or 4 small incisions in the abdomen. A computer-controlled device is used to give the surgeon a 3D image and to help control the surgical instruments. This allows for more precise movements of surgical instruments. The uterus is cut into small pieces and removed through the incisions or removed through the vagina. RISKS AND COMPLICATIONS  Possible complications associated with this procedure include:  Bleeding and risk of blood transfusion. Tell your health care provider if you do not want to receive any blood products.  Blood clots in the legs or lung.  Infection.  Injury to surrounding organs.  Problems or side effects related to anesthesia.  Conversion to an abdominal hysterectomy from one of the other techniques. WHAT TO EXPECT AFTER A HYSTERECTOMY  You will be given pain medicine.  You will need to have someone with you for the first 3-5 days after you go home.  You will need to follow up with your surgeon in 2-4 weeks after surgery to evaluate your progress.  You may have early menopause symptoms such as hot flashes, night sweats, and insomnia.  If you had a hysterectomy for a problem that was not cancer or not a condition that could lead to cancer, then you no longer need Pap tests. However, even if you no longer need a Pap test, a regular exam is a good idea to make sure no other problems are starting. Document Released: 09/20/2000 Document Revised: 01/15/2013 Document Reviewed: 12/02/2012 [facility] Patient Information 2015 [place], [place]. This information is not intended to replace advice given to you by your health care provider. Make sure you discuss any questions you have with your health care provider.

## 2014-02-13 NOTE — Progress Notes (Signed)
Spoke with patient and made her aware to arrive at 0925am.  Surgery at 1125am.  Patient wrote down and voiced understanding.  Also called niece, Ivin Booty ( who came with her for preop) and made her aware of time change to arrive at 0925am and surgery at 1125am.  Voiced understanding.

## 2014-02-16 ENCOUNTER — Telehealth: Payer: Self-pay | Admitting: *Deleted

## 2014-02-16 NOTE — Telephone Encounter (Signed)
Called pt with reminder to drink clear liquids, NPO after midnight prior to surgery 11/10

## 2014-02-17 ENCOUNTER — Encounter (HOSPITAL_COMMUNITY)
Admission: RE | Disposition: A | Discharge status: Home / Self Care | Payer: Self-pay | Source: Ambulatory Visit | Attending: Obstetrics & Gynecology

## 2014-02-17 ENCOUNTER — Ambulatory Visit (HOSPITAL_COMMUNITY)
Admission: RE | Admit: 2014-02-17 | Discharge: 2014-02-19 | Disposition: A | Discharge status: Home / Self Care | Payer: Medicare Other | Source: Ambulatory Visit | Attending: Obstetrics & Gynecology | Admitting: Obstetrics & Gynecology

## 2014-02-17 ENCOUNTER — Ambulatory Visit (HOSPITAL_COMMUNITY): Payer: Medicare Other | Admitting: Anesthesiology

## 2014-02-17 ENCOUNTER — Encounter (HOSPITAL_COMMUNITY): Payer: Self-pay | Admitting: Gynecologic Oncology

## 2014-02-17 ENCOUNTER — Other Ambulatory Visit: Payer: Self-pay

## 2014-02-17 DIAGNOSIS — Z79899 Other long term (current) drug therapy: Secondary | ICD-10-CM | POA: Insufficient documentation

## 2014-02-17 DIAGNOSIS — K559 Vascular disorder of intestine, unspecified: Secondary | ICD-10-CM | POA: Insufficient documentation

## 2014-02-17 DIAGNOSIS — Z881 Allergy status to other antibiotic agents status: Secondary | ICD-10-CM | POA: Diagnosis not present

## 2014-02-17 DIAGNOSIS — K5730 Diverticulosis of large intestine without perforation or abscess without bleeding: Secondary | ICD-10-CM | POA: Insufficient documentation

## 2014-02-17 DIAGNOSIS — C541 Malignant neoplasm of endometrium: Secondary | ICD-10-CM

## 2014-02-17 DIAGNOSIS — Z888 Allergy status to other drugs, medicaments and biological substances status: Secondary | ICD-10-CM | POA: Diagnosis not present

## 2014-02-17 DIAGNOSIS — E780 Pure hypercholesterolemia: Secondary | ICD-10-CM | POA: Insufficient documentation

## 2014-02-17 DIAGNOSIS — I2510 Atherosclerotic heart disease of native coronary artery without angina pectoris: Secondary | ICD-10-CM | POA: Diagnosis not present

## 2014-02-17 DIAGNOSIS — Z8673 Personal history of transient ischemic attack (TIA), and cerebral infarction without residual deficits: Secondary | ICD-10-CM | POA: Insufficient documentation

## 2014-02-17 DIAGNOSIS — C778 Secondary and unspecified malignant neoplasm of lymph nodes of multiple regions: Secondary | ICD-10-CM | POA: Diagnosis not present

## 2014-02-17 DIAGNOSIS — M19041 Primary osteoarthritis, right hand: Secondary | ICD-10-CM | POA: Diagnosis not present

## 2014-02-17 DIAGNOSIS — Z886 Allergy status to analgesic agent status: Secondary | ICD-10-CM | POA: Insufficient documentation

## 2014-02-17 DIAGNOSIS — I10 Essential (primary) hypertension: Secondary | ICD-10-CM | POA: Diagnosis not present

## 2014-02-17 DIAGNOSIS — E669 Obesity, unspecified: Secondary | ICD-10-CM | POA: Diagnosis not present

## 2014-02-17 DIAGNOSIS — Z800 Family history of malignant neoplasm of digestive organs: Secondary | ICD-10-CM | POA: Insufficient documentation

## 2014-02-17 DIAGNOSIS — M19042 Primary osteoarthritis, left hand: Secondary | ICD-10-CM | POA: Insufficient documentation

## 2014-02-17 DIAGNOSIS — Z87442 Personal history of urinary calculi: Secondary | ICD-10-CM | POA: Insufficient documentation

## 2014-02-17 HISTORY — PX: LYMPH NODE DISSECTION: SHX5087

## 2014-02-17 HISTORY — PX: ROBOTIC ASSISTED TOTAL HYSTERECTOMY WITH BILATERAL SALPINGO OOPHERECTOMY: SHX6086

## 2014-02-17 LAB — TYPE AND SCREEN
ABO/RH(D): B POS
Antibody Screen: NEGATIVE

## 2014-02-17 SURGERY — ROBOTIC ASSISTED TOTAL HYSTERECTOMY WITH BILATERAL SALPINGO OOPHORECTOMY
Anesthesia: General

## 2014-02-17 MED ORDER — DEXAMETHASONE SODIUM PHOSPHATE 10 MG/ML IJ SOLN
INTRAMUSCULAR | Status: AC
  Filled 2014-02-17: qty 1

## 2014-02-17 MED ORDER — OXYCODONE HCL 5 MG/5ML PO SOLN
5.0000 mg | Freq: Once | ORAL | Status: DC | PRN
  Filled 2014-02-17: qty 5

## 2014-02-17 MED ORDER — NEOSTIGMINE METHYLSULFATE 10 MG/10ML IV SOLN
INTRAVENOUS | Status: AC
  Filled 2014-02-17: qty 1

## 2014-02-17 MED ORDER — GLYCOPYRROLATE 0.2 MG/ML IJ SOLN
INTRAMUSCULAR | Status: AC
  Filled 2014-02-17: qty 3

## 2014-02-17 MED ORDER — LIDOCAINE HCL (PF) 2 % IJ SOLN
INTRAMUSCULAR | Status: DC | PRN
  Administered 2014-02-17: 70 mg via INTRADERMAL

## 2014-02-17 MED ORDER — PROPOFOL 10 MG/ML IV BOLUS
INTRAVENOUS | Status: AC
  Filled 2014-02-17: qty 20

## 2014-02-17 MED ORDER — ACETAMINOPHEN 10 MG/ML IV SOLN
1000.0000 mg | Freq: Once | INTRAVENOUS | Status: DC
  Filled 2014-02-17: qty 100

## 2014-02-17 MED ORDER — LACTATED RINGERS IR SOLN
Status: DC | PRN
  Administered 2014-02-17: 1000 mL

## 2014-02-17 MED ORDER — MEPERIDINE HCL 50 MG/ML IJ SOLN: 6.2500 mg | INTRAMUSCULAR | Status: DC | PRN

## 2014-02-17 MED ORDER — HYDROMORPHONE HCL 1 MG/ML IJ SOLN
0.2500 mg | INTRAMUSCULAR | Status: DC | PRN
  Administered 2014-02-17 (×2): 0.5 mg via INTRAVENOUS

## 2014-02-17 MED ORDER — CLINDAMYCIN PHOSPHATE 900 MG/50ML IV SOLN
INTRAVENOUS | Status: AC
  Filled 2014-02-17: qty 50

## 2014-02-17 MED ORDER — LACTATED RINGERS IV SOLN
INTRAVENOUS | Status: DC | PRN
  Administered 2014-02-17 (×3): via INTRAVENOUS
  Administered 2014-02-17: 1000 mL

## 2014-02-17 MED ORDER — IRBESARTAN 75 MG PO TABS
75.0000 mg | ORAL_TABLET | Freq: Every day | ORAL | Status: DC
  Administered 2014-02-18 – 2014-02-19 (×2): 75 mg via ORAL
  Filled 2014-02-17 (×2): qty 1

## 2014-02-17 MED ORDER — DEXAMETHASONE SODIUM PHOSPHATE 10 MG/ML IJ SOLN
INTRAMUSCULAR | Status: DC | PRN
  Administered 2014-02-17: 10 mg via INTRAVENOUS

## 2014-02-17 MED ORDER — CEFAZOLIN (ANCEF) 1 G IV SOLR: 2.0000 g | INTRAVENOUS | Status: DC

## 2014-02-17 MED ORDER — FENTANYL CITRATE 0.05 MG/ML IJ SOLN
INTRAMUSCULAR | Status: DC | PRN
  Administered 2014-02-17 (×5): 50 ug via INTRAVENOUS

## 2014-02-17 MED ORDER — OXYCODONE-ACETAMINOPHEN 5-325 MG PO TABS
1.0000 | ORAL_TABLET | ORAL | Status: DC | PRN
  Administered 2014-02-19: 1 via ORAL
  Filled 2014-02-17: qty 1

## 2014-02-17 MED ORDER — CLINDAMYCIN PHOSPHATE 900 MG/50ML IV SOLN
900.0000 mg | Freq: Once | INTRAVENOUS | Status: AC
  Administered 2014-02-17: 900 mg via INTRAVENOUS

## 2014-02-17 MED ORDER — ONDANSETRON HCL 4 MG/2ML IJ SOLN
INTRAMUSCULAR | Status: AC
  Filled 2014-02-17: qty 2

## 2014-02-17 MED ORDER — TRAMADOL HCL 50 MG PO TABS: 50.0000 mg | ORAL_TABLET | Freq: Four times a day (QID) | ORAL | Status: DC | PRN

## 2014-02-17 MED ORDER — PROPOFOL 10 MG/ML IV BOLUS
INTRAVENOUS | Status: DC | PRN
  Administered 2014-02-17: 150 mg via INTRAVENOUS

## 2014-02-17 MED ORDER — ONDANSETRON HCL 4 MG/2ML IJ SOLN
INTRAMUSCULAR | Status: DC | PRN
  Administered 2014-02-17: 4 mg via INTRAVENOUS

## 2014-02-17 MED ORDER — ENOXAPARIN SODIUM 40 MG/0.4ML ~~LOC~~ SOLN
40.0000 mg | SUBCUTANEOUS | Status: AC
  Administered 2014-02-17: 40 mg via SUBCUTANEOUS
  Filled 2014-02-17: qty 0.4

## 2014-02-17 MED ORDER — PHENYLEPHRINE HCL 10 MG/ML IJ SOLN
INTRAMUSCULAR | Status: AC
  Filled 2014-02-17: qty 1

## 2014-02-17 MED ORDER — LIDOCAINE HCL (CARDIAC) 20 MG/ML IV SOLN
INTRAVENOUS | Status: AC
  Filled 2014-02-17: qty 5

## 2014-02-17 MED ORDER — EPHEDRINE SULFATE 50 MG/ML IJ SOLN
INTRAMUSCULAR | Status: DC | PRN
  Administered 2014-02-17: 5 mg via INTRAVENOUS

## 2014-02-17 MED ORDER — PROMETHAZINE HCL 25 MG/ML IJ SOLN: 6.2500 mg | INTRAMUSCULAR | Status: DC | PRN

## 2014-02-17 MED ORDER — SUCCINYLCHOLINE CHLORIDE 20 MG/ML IJ SOLN
INTRAMUSCULAR | Status: DC | PRN
  Administered 2014-02-17: 100 mg via INTRAVENOUS

## 2014-02-17 MED ORDER — OXYCODONE HCL 5 MG PO TABS: 5.0000 mg | ORAL_TABLET | Freq: Once | ORAL | Status: DC | PRN

## 2014-02-17 MED ORDER — ONDANSETRON HCL 4 MG/2ML IJ SOLN
4.0000 mg | Freq: Four times a day (QID) | INTRAMUSCULAR | Status: DC | PRN
Start: 2014-02-17 — End: 2014-02-19

## 2014-02-17 MED ORDER — KCL IN DEXTROSE-NACL 20-5-0.45 MEQ/L-%-% IV SOLN
INTRAVENOUS | Status: DC
  Administered 2014-02-18: 05:00:00 via INTRAVENOUS
  Filled 2014-02-17 (×3): qty 1000

## 2014-02-17 MED ORDER — NEOSTIGMINE METHYLSULFATE 10 MG/10ML IV SOLN
INTRAVENOUS | Status: DC | PRN
  Administered 2014-02-17: 3 mg via INTRAVENOUS

## 2014-02-17 MED ORDER — STERILE WATER FOR IRRIGATION IR SOLN
Status: DC | PRN
  Administered 2014-02-17: 3000 mL

## 2014-02-17 MED ORDER — METRONIDAZOLE IN NACL 5-0.79 MG/ML-% IV SOLN
500.0000 mg | Freq: Once | INTRAVENOUS | Status: AC
  Administered 2014-02-17: 500 mg via INTRAVENOUS

## 2014-02-17 MED ORDER — FENTANYL CITRATE 0.05 MG/ML IJ SOLN
INTRAMUSCULAR | Status: AC
  Filled 2014-02-17: qty 2

## 2014-02-17 MED ORDER — METRONIDAZOLE IN NACL 5-0.79 MG/ML-% IV SOLN
INTRAVENOUS | Status: AC
  Filled 2014-02-17: qty 100

## 2014-02-17 MED ORDER — CEFAZOLIN SODIUM-DEXTROSE 2-3 GM-% IV SOLR
INTRAVENOUS | Status: AC
  Filled 2014-02-17: qty 50

## 2014-02-17 MED ORDER — HYDROMORPHONE HCL 1 MG/ML IJ SOLN: 0.5000 mg | INTRAMUSCULAR | Status: DC | PRN

## 2014-02-17 MED ORDER — CISATRACURIUM BESYLATE (PF) 10 MG/5ML IV SOLN
INTRAVENOUS | Status: DC | PRN
  Administered 2014-02-17: 2 mg via INTRAVENOUS
  Administered 2014-02-17: 4 mg via INTRAVENOUS
  Administered 2014-02-17 (×3): 1 mg via INTRAVENOUS

## 2014-02-17 MED ORDER — ENOXAPARIN SODIUM 40 MG/0.4ML ~~LOC~~ SOLN
40.0000 mg | SUBCUTANEOUS | Status: DC
  Administered 2014-02-18 – 2014-02-19 (×2): 40 mg via SUBCUTANEOUS
  Filled 2014-02-17 (×3): qty 0.4

## 2014-02-17 MED ORDER — FENTANYL CITRATE 0.05 MG/ML IJ SOLN
INTRAMUSCULAR | Status: AC
  Filled 2014-02-17: qty 5

## 2014-02-17 MED ORDER — HYDROMORPHONE HCL 1 MG/ML IJ SOLN
INTRAMUSCULAR | Status: AC
  Filled 2014-02-17: qty 1

## 2014-02-17 MED ORDER — GLYCOPYRROLATE 0.2 MG/ML IJ SOLN
INTRAMUSCULAR | Status: DC | PRN
  Administered 2014-02-17: 0.4 mg via INTRAVENOUS

## 2014-02-17 MED ORDER — ONDANSETRON HCL 4 MG PO TABS: 4.0000 mg | ORAL_TABLET | Freq: Four times a day (QID) | ORAL | Status: DC | PRN

## 2014-02-17 SURGICAL SUPPLY — 58 items
BENZOIN TINCTURE PRP APPL 2/3 (GAUZE/BANDAGES/DRESSINGS) ×3 IMPLANT
CABLE HIGH FREQUENCY MONO STRZ (ELECTRODE) ×3 IMPLANT
CHLORAPREP W/TINT 26ML (MISCELLANEOUS) ×3 IMPLANT
CLOSURE WOUND 1/2 X4 (GAUZE/BANDAGES/DRESSINGS) ×2
CORDS BIPOLAR (ELECTRODE) ×3 IMPLANT
COVER SURGICAL LIGHT HANDLE (MISCELLANEOUS) ×3 IMPLANT
COVER TIP SHEARS 8 DVNC (MISCELLANEOUS) ×1 IMPLANT
COVER TIP SHEARS 8MM DA VINCI (MISCELLANEOUS) ×2
DRAPE SHEET LG 3/4 BI-LAMINATE (DRAPES) ×6 IMPLANT
DRAPE SURG IRRIG POUCH 19X23 (DRAPES) ×3 IMPLANT
DRAPE TABLE BACK 44X90 PK DISP (DRAPES) ×6 IMPLANT
DRAPE UTILITY XL STRL (DRAPES) ×3 IMPLANT
DRAPE WARM FLUID 44X44 (DRAPE) ×3 IMPLANT
DRSG TEGADERM 2-3/8X2-3/4 SM (GAUZE/BANDAGES/DRESSINGS) ×12 IMPLANT
DRSG TEGADERM 4X4.75 (GAUZE/BANDAGES/DRESSINGS) ×3 IMPLANT
DRSG TEGADERM 6X8 (GAUZE/BANDAGES/DRESSINGS) ×6 IMPLANT
ELECT REM PT RETURN 9FT ADLT (ELECTROSURGICAL) ×3
ELECTRODE REM PT RTRN 9FT ADLT (ELECTROSURGICAL) ×1 IMPLANT
GAUZE SPONGE 2X2 8PLY STRL LF (GAUZE/BANDAGES/DRESSINGS) ×1 IMPLANT
GLOVE BIO SURGEON STRL SZ 6.5 (GLOVE) ×4 IMPLANT
GLOVE BIO SURGEON STRL SZ7.5 (GLOVE) IMPLANT
GLOVE BIO SURGEONS STRL SZ 6.5 (GLOVE) ×2
GLOVE BIOGEL PI IND STRL 7.0 (GLOVE) ×2 IMPLANT
GLOVE BIOGEL PI INDICATOR 7.0 (GLOVE) ×4
GOWN STRL REUS W/ TWL XL LVL3 (GOWN DISPOSABLE) ×2 IMPLANT
GOWN STRL REUS W/TWL XL LVL3 (GOWN DISPOSABLE) ×4
HOLDER FOLEY CATH W/STRAP (MISCELLANEOUS) ×3 IMPLANT
KIT ACCESSORY DA VINCI DISP (KITS) ×2
KIT ACCESSORY DVNC DISP (KITS) ×1 IMPLANT
KIT BASIN OR (CUSTOM PROCEDURE TRAY) ×3 IMPLANT
MANIFOLD NEPTUNE II (INSTRUMENTS) ×3 IMPLANT
MANIPULATOR UTERINE 4.5 ZUMI (MISCELLANEOUS) ×3 IMPLANT
OCCLUDER COLPOPNEUMO (BALLOONS) ×3 IMPLANT
PEN SKIN MARKING BROAD (MISCELLANEOUS) ×3 IMPLANT
POUCH SPECIMEN RETRIEVAL 10MM (ENDOMECHANICALS) ×9 IMPLANT
SET TUBE IRRIG SUCTION NO TIP (IRRIGATION / IRRIGATOR) ×3 IMPLANT
SHEET LAVH (DRAPES) ×3 IMPLANT
SOLUTION ANTI FOG 6CC (MISCELLANEOUS) ×3 IMPLANT
SOLUTION ELECTROLUBE (MISCELLANEOUS) ×3 IMPLANT
SPONGE GAUZE 2X2 STER 10/PKG (GAUZE/BANDAGES/DRESSINGS) ×2
SPONGE LAP 18X18 X RAY DECT (DISPOSABLE) IMPLANT
STRIP CLOSURE SKIN 1/2X4 (GAUZE/BANDAGES/DRESSINGS) ×4 IMPLANT
SUT VIC AB 0 CT1 27 (SUTURE) ×2
SUT VIC AB 0 CT1 27XBRD ANTBC (SUTURE) ×1 IMPLANT
SUT VIC AB 2-0 SH 27 (SUTURE) ×2
SUT VIC AB 2-0 SH 27X BRD (SUTURE) ×1 IMPLANT
SUT VIC AB 4-0 PS2 27 (SUTURE) ×6 IMPLANT
SUT VICRYL 0 UR6 27IN ABS (SUTURE) IMPLANT
SYR 50ML LL SCALE MARK (SYRINGE) ×3 IMPLANT
SYR BULB IRRIGATION 50ML (SYRINGE) IMPLANT
TOWEL OR 17X26 10 PK STRL BLUE (TOWEL DISPOSABLE) ×6 IMPLANT
TRAP SPECIMEN MUCOUS 40CC (MISCELLANEOUS) IMPLANT
TRAY FOLEY CATH 14FRSI W/METER (CATHETERS) ×3 IMPLANT
TRAY LAPAROSCOPIC (CUSTOM PROCEDURE TRAY) ×3 IMPLANT
TROCAR 12M 150ML BLUNT (TROCAR) ×3 IMPLANT
TROCAR BLADELESS OPT 5 100 (ENDOMECHANICALS) ×3 IMPLANT
TROCAR XCEL 12X100 BLDLESS (ENDOMECHANICALS) ×3 IMPLANT
TUBING INSUFFLATION 10FT LAP (TUBING) ×3 IMPLANT

## 2014-02-17 NOTE — Op Note (Signed)
PATIENT: [name] DATE OF BIRTH: Apr 23, 1932 ENCOUNTER DATE: 02/17/2014   Preop Diagnosis: Grade 2 endometrioid adenocarcinoma.   Postoperative Diagnosis: Same.   Surgery: Total robotic hysterectomy bilateral salpingo-oophorectomy, bilateral pelvic and para-aortic lymph node dissection.   Surgeons:  Lucita Lora. Alycia Rossetti, MD; Lahoma Crocker, MD   Anesthesia: General   Estimated blood loss: 50  ml   IVF: 2000 ml   Urine output: 123XX123 ml   Complications: None   Pathology: Uterus, cervix, bilateral tubes and ovaries, bilateral para-aortic and pelvic lymph nodes to pathology.   Operative findings: Narrow vaginal introitus. Normal appearing uterus, cervix and adnexa. No obvious evidence of metastatic disease. Normal appearing appendix. Adhesive disease of the omentum to the anterior abdominal wall.  Procedure: The patient was identified in the preoperative holding area. Informed consent was signed on the chart. Patient was seen history was reviewed and exam was performed.   The patient was then taken to the operating room and placed in the supine position with SCD hose on. She was then placed in the dorsolithotomy position. Her arms were tucked at her side with appropriate precautions on the gel pad. General anesthesia was then induced without difficulty. Shoulder blocks were then placed in the usual fashion with appropriate precautions. A OG-tube was placed to suction. First timeout was performed to confirm the patient, procedure, antibiotic, allergy status, estimated blood loss and OR time. The perineum was then prepped in the usual fashion with Betadine. A 14 French Foley was inserted into the bladder under sterile conditions. A sterile speculum could not be placed in the vagina, therefore an EEA sizer with pneumo-occluder balloon was placed. The abdomen was then prepped with 1 Chlor prep sponges per protocol.   Patient was then draped after the prep was dried. Second timeout was  performed to confirm the above. After again confirming OG tube placement and it was to suction. A stab-wound was made in left upper quadrant 2 cm below the costal margin on the left in the midclavicular line. A 5 mm operative report was used to assure intra-abdominal placement. The abdomen was insufflated. At this point all points during the procedure the patient's intra-abdominal pressure was not increased over 15 mm of mercury. After insufflation was complete, the patient was placed in deep Trendelenburg position. 25 cm above the pubic symphysis that area was marked the camera port. Bilateral robotic ports were marked 10 cm from the midline incision at approximately 5 angle. The fourth port was placed 2 cm above and medial to the anterior superior iliac spine. Adhesive disease of the omentum to anterior abdominal wall was taken down using monopolar cautery. Under direct visualization each of the trochars was placed into the abdomen. The small bowel was folded on its mesentery to allow visualization to the pelvis. The 5 mm LUQ port was then converted to a 10/12 port under direct visualization.  After assuring adequate visualization, the robot was then docked in the usual fashion. Under direct visualization the robotic instruments replaced.   An incision was made overlying the common iliac artery on the patient's right side. This was extended to the level of the duodenum with monopolar cautery. The retroperitoneal spaces were entered. The ureter was identified and held superior to the point of dissection with the fourth arm of the robot. The nodal bundle extending from the mid common iliac artery on the right to what appeared to be the renal vein and artery was taken down using monopolar cautery. It was delivered through the  assistant port. Our attention was then drawn to performing a left para-aortic lymph node dissection. The peritoneal incision was extended superiorly. The IMA was deviated laterally. The  ureter was identified on the surface of the psoas bellies and using the assistant port the ureter was held medially from the area of dissection. The nodal bundle to the left of the aorta was taken down from the level of the IMA to the midpoint common iliac artery on her left side. This bundle was placed and an Endo catch bag and delivered through the assistant port. The area was noted to be hemostatic.  The round ligament on the patient's right side was transected with monopolar cautery. The anterior and posterior leaves of the broad ligament were then taken down in the usual fashion. The ureter was identified on the medial leaf of the broad ligament. A window was made between the IP and the ureter. The IP was coagulated with bipolar cautery and transected. The posterior leaf of the broad ligament was taken down to the level of the EEA sizer and the uterine vessels. The bladder flap was created using meticulous dissection and pinpoint cautery. The uterine vessels were coagulated with bipolar cautery. The uterine vessels were then transected and the C loop was created. The same procedure was performed on the patient's left side.   The pneumo-occulder in the vagina was then insufflated. The colpotomy was then created in the usual fashion. Our attention was then drawn to opening the paravesical space on the right side the perirectal space was also opened. The obturator nerve was identified. The nodal bundle extending over the external iliac artery down to the external iliac vein was taken down using sharp dissection and monopolar cautery. The genitofemoral nerve was identified and spared. The dissection continued down to the level of the obturator nerve. The nodal bundle superior to the obturator nerve was taken. All pedicles were noted to be hemostatic the ureter was noted to be well medial of the area of dissection. The nodal bundle was then placed and an Endo catch bag. The same procedure was performed on the  left side.  All specimens were delivered to the vagina. The vaginal cuff was closed with a running 0 Vicryl on CT 1 suture. The abdomen and pelvis were copiously irrigated and noted to be hemostatic. The robotic instruments were removed under direct visualization as were the robotic trochars. The pneumoperitoneum was removed. The patient was then taken out of the Trendelenburg position. Using of 0 Vicryl on a UR 6 needle the midline port fascia was closed after being grasped with allis clamps. The subcutaneous tissues of the port in the left upper quadrant was reapproximated. The skin was closed using 4-0 Vicryl. Steri-Strips and benzoin were applied. The pneumo occluded balloon was removed from the vagina. The vagina was swabbed and noted to be hemostatic. However at the level of the introitus there was a small subcentimeter tear with a minimal amount of bleeding. 2 interrupted sutures of 2-0 Vicryl placed in this area for hemostasis.   All instrument needle and Ray-Tec counts were correct x2. The patient tolerated the procedure well and was taken to the recovery room in stable condition. This is Nancy Marus dictating an operative note on patient [name].

## 2014-02-17 NOTE — Anesthesia Postprocedure Evaluation (Signed)
Anesthesia Post Note  Patient: [name]  Procedure(s) Performed: Procedure(s) (LRB): ROBOTIC ASSISTED TOTAL HYSTERECTOMY WITH BILATERAL SALPINGO OOPHORECTOMY  (N/A) LYMPH NODE DISSECTION (N/A)  Anesthesia type: General  Patient location: PACU  Post pain: Pain level controlled  Post assessment: Post-op Vital signs reviewed  Last Vitals: BP 110/52 mmHg  Pulse 44  Temp(Src) 36.4 C (Oral)  Resp 10  Ht 5\' 1"  (1.549 m)  Wt 144 lb 6 oz (65.488 kg)  BMI 27.29 kg/m2  SpO2 99%  Post vital signs: Reviewed  Level of consciousness: sedated  Complications: No apparent anesthesia complications

## 2014-02-17 NOTE — Plan of Care (Signed)
Problem: Phase I Progression Outcomes Goal: Pain controlled with appropriate interventions Outcome: Completed/Met Date Met:  02/17/14 Goal: OOB as tolerated unless otherwise ordered Outcome: Completed/Met Date Met:  02/17/14 Goal: Incision/dressings dry and intact Outcome: Completed/Met Date Met:  02/17/14 Goal: Tubes/drains patent Outcome: Not Applicable Date Met:  87/18/41 Goal: Vital signs/hemodynamically stable Outcome: Completed/Met Date Met:  02/17/14

## 2014-02-17 NOTE — Progress Notes (Signed)
PACU note----pt's heart rate remains 40's, EKG done; Dr. Lissa Hoard in to check pt

## 2014-02-17 NOTE — Anesthesia Preprocedure Evaluation (Addendum)
Anesthesia Evaluation  Patient identified by MRN, date of birth, ID band Patient awake    Reviewed: Allergy & Precautions, H&P , NPO status , Patient's Chart, lab work & pertinent test results  Airway Mallampati: I  TM Distance: >3 FB Neck ROM: full    Dental no notable dental hx. (+) Teeth Intact   Pulmonary neg pulmonary ROS,    Pulmonary exam normal       Cardiovascular hypertension, Pt. on medications + CAD     Neuro/Psych PSYCHIATRIC DISORDERS TIA   GI/Hepatic Neg liver ROS, GERD-  Controlled,  Endo/Other  negative endocrine ROS  Renal/GU negative Renal ROS     Musculoskeletal  (+) Arthritis -,   Abdominal Normal abdominal exam  (+)   Peds  Hematology negative hematology ROS (+)   Anesthesia Other Findings   Reproductive/Obstetrics negative OB ROS                            Anesthesia Physical  Anesthesia Plan  ASA: III  Anesthesia Plan: General   Post-op Pain Management:    Induction: Intravenous  Airway Management Planned: Oral ETT  Additional Equipment: None  Intra-op Plan:   Post-operative Plan: Extubation in OR  Informed Consent: I have reviewed the patients History and Physical, chart, labs and discussed the procedure including the risks, benefits and alternatives for the proposed anesthesia with the patient or authorized representative who has indicated his/her understanding and acceptance.   Dental advisory given  Plan Discussed with: CRNA  Anesthesia Plan Comments:         Anesthesia Quick Evaluation

## 2014-02-17 NOTE — Progress Notes (Signed)
PACU note----Dr. Lissa Hoard in to check pt; OK for pt to go to floor and remain on pulse ox overnight

## 2014-02-17 NOTE — H&P (View-Only) (Signed)
Consult Note: Gyn-Onc  Consult was requested by Dr. Gus Height for the evaluation of [name] 80 y.o. female with grade 2 endometrioid endometrial adenocarcinoma  CC: Dr Harle Battiest Chief Complaint  Patient presents with  . Endometrial Cancer    Assessment/Plan:  Ms. LEYANNA BITTMAN  is a 80 y.o. 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 She also has a concerning family history of colon cancer (sister, aunt and uncle) which raises the possiblity of an underlying Lynch syndrome diagnosis.   A detailed discussion was held with the patient and her family by Dr. Denman George with regard to to her endometrial cancer diagnosis. We discussed the standard management options for uterine cancer which includes surgery followed possibly by adjuvant therapy depending on the results of surgery. The options for surgical management include a hysterectomy and removal of the tubes and ovaries possibly with removal of pelvic and para-aortic lymph nodes. A minimally invasive approach including a robotic hysterectomy or laparoscopic hysterectomy have benefits including shorter hospital stay, recovery time and better wound healing. The alternative approach is an open hysterectomy. The patient has been counseled about these surgical options and the risks of surgery in general including infection, bleeding, damage to surrounding structures (including bowel, bladder, ureters, nerves or vessels), and the postoperative risks of PE/ DVT, and lymphedema. I extensively reviewed the additional risks of robotic hysterectomy including possible need for conversion to open laparotomy.  I discussed positioning during surgery of trendelenberg and risks of minor facial swelling and care we take in preoperative positioning.  After counseling and consideration of her options, she desires to proceed with robotic hysterectomy, BSO, and lymphadenectomy.   CT of the abdomen and pelvis was  negative.  We discussed the need to proceed with microsatellite instability testing after surgery secondary to her family history. She is amenable to doing this would also be happy to see genetics should microsatellite instability testing be concerning for Lynch syndrome she be happy to donate blood and see genetics of the benefits her family.   HPI: Ms Lamoreaux is a 80 year old very healthy woman with a 4-6 month history of abnormal bloody vaginal discharge. She is nulliparous and virginal. She lives alone. She was seen by her gynecologist Dr Gus Height who performed a transvaginal US on 12/11/13 which showed a 6.6x3.8x3x7cm uterus with an endometrial thickness of 62m. The ovaries were not visualized.  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  She has a strong family history of colon caner (sister, aunt and uncle). Her last colonoscopy revealed benign polyps.   She is virginal and nulliparous  Interval History: She has some mild cramping in the lower pelvis. Her bleeding is mild/spotting and stable. She denies lower extremity edema.  She and I discussed DO NOT RESUSCITATE and DO NOT INTUBATE status. I discussed with her that that would need to be held during the time of surgery and she is happy to do that. She does not currently have a DNR/DNI in effect but it is something that she has considered doing after her surgery.  Her CT scan reveals: EXAM: CT ABDOMEN AND PELVIS WITH CONTRAST  TECHNIQUE: Multidetector CT imaging of the abdomen and pelvis was performed using the standard protocol following bolus administration of intravenous contrast.  CONTRAST: 868mOMNIPAQUE IOHEXOL 300 MG/ML SOLN  COMPARISON: 06/02/2013  FINDINGS: Lower chest: Clear lung bases. Mild cardiomegaly, accentuated by a pectus  excavatum deformity. Right coronary artery atherosclerosis. No pericardial or pleural  effusion.  Hepatobiliary: Normal liver. Cholecystectomy, without biliary ductal dilatation.  Pancreas: Normal, without mass or pancreatic ductal dilatation.  Spleen: Normal  Adrenals/Urinary Tract: Normal adrenal glands. Mild bilateral renal cortical atrophy. Bilateral multiple renal cysts and too small to characterize lesions. An interpolar left renal lesion measures fluid density centrally but demonstrates minimal increased density in its dependent portion on image 36 of series 2. 1.5 cm. No hydronephrosis. Normal urinary bladder.  Stomach/Bowel: Normal stomach, without wall thickening. A moderate size descending duodenal diverticulum. Extensive colonic diverticulosis with muscular hypertrophy involving the sigmoid. Normal terminal ileum. Otherwise normal appearance of the small bowel.  Vascular/Lymphatic: Atherosclerosis, within the aorta and at the origin of both renal arteries. No retroperitoneal or retrocrural adenopathy. No pelvic adenopathy.  Reproductive: Moderate pelvic floor laxity. Retroverted uterus. Central hypoattenuation with surrounding hyper enhancement involving the uterine fundus. This is retroverted, including on sagittal image 53. No gross extension outside of the uterus.  Other: No evidence of omental or peritoneal disease. No abdominal pelvic fluid.  Musculoskeletal: Degenerative sclerosis involving the symphysis pubis. Right hip osteoarthritis. Lumbar spondylosis.  IMPRESSION: 1. Uterine fundal hyper enhancement and central hypoattenuation, possibly representing the primary endometrial carcinoma. No gross evidence of extrauterine spread and no evidence of nodal metastasis. 2. Pelvic floor laxity. 3. Bilateral renal cortical thinning with presumed cysts and a minimally complex interpolar left renal cyst. 4. Atherosclerosis, including within the coronary arteries.  Current Meds:  Outpatient Encounter Prescriptions as of 02/11/2014  Medication Sig  .  valsartan (DIOVAN) 80 MG tablet Take 80 mg by mouth every morning.   Marland Kitchen acetaminophen (TYLENOL) 325 MG tablet Take 325-650 mg by mouth every 6 (six) hours as needed for mild pain or headache.  . [DISCONTINUED] ALPRAZolam (XANAX) 0.5 MG tablet   . [DISCONTINUED] methylPREDNIsolone (MEDROL DOSPACK) 4 MG tablet     Allergy:  Allergies  Allergen Reactions  . Benadryl [Diphenhydramine Hcl (Sleep)] Other (See Comments)    Light headed, heart racing.   . Ciprofloxacin Other (See Comments)    Leg pains  . Pravastatin Other (See Comments)    Leg cramps  . Vioxx [Rofecoxib] Nausea Only  . Cephalosporins Rash    Social Hx:   History   Social History  . Marital Status: Single    Spouse Name: N/A    Number of Children: 0  . Years of Education: N/A   Occupational History  . retired    Social History Main Topics  . Smoking status: Never Smoker   . Smokeless tobacco: Never Used  . Alcohol Use: No  . Drug Use: No  . Sexual Activity: No   Other Topics Concern  . Not on file   Social History Narrative    Past Surgical Hx:  Past Surgical History  Procedure Laterality Date  . Tonsillectomy and adenoidectomy  1958  . Lumbar disc surgery  1998    right L1-L2  . Cholecystectomy, laparoscopic  2002  . Rotator cuff repair  2003    bilateral  . Tendon graft  2003    with bone graft  . Skin biopsy      four times last 2005 - nose cancer  . Cataract extraction  2005    bilateral  . Cataract extraction w/ intraocular lens  implant, bilateral    . Familiar tremor    . Cholecystectomy    . Wisdom tooth extraction    . Eye surgery    .  Colonoscopy    . Hysteroscopy w/d&c N/A 12/18/2013    Procedure: DILATATION AND CURETTAGE /HYSTEROSCOPY;  Surgeon: Gus Height, MD;  Location: [facility];  Service: Gynecology;  Laterality: N/A;    Past Medical Hx:  Past Medical History  Diagnosis Date  . Diverticulosis of colon (without mention of hemorrhage)   . Unspecified gastritis and  gastroduodenitis without mention of hemorrhage   . Hypertension   . Hypercholesterolemia     diet controlled  . TIA (transient ischemic attack)     years ago  . Colitis, ischemic   . CAD (coronary artery disease)   . Esophageal stricture   . History of gallstones   . History of kidney stones     passed stones no surgery required  . Obesity   . Familial tremor   . Varicose veins     lower legs  . Confusion     age related, denies dementia  . Neuromuscular disorder     bilateral hand temor -   . Arthritis     hands  . Basal cell carcinoma of nose   . Endometrial ca dx'd 12/2013    Past Gynecological History:  Virginal, nulliparous, menopause in her 70's  No LMP recorded. Patient is postmenopausal.  Family Hx:  Family History  Problem Relation Age of Onset  . Heart disease Sister   . Diabetes Brother     x 4  . Colon cancer Sister   . Crohn's disease      nephew  . Colon cancer Maternal Aunt   . Colon cancer Maternal Uncle   . Heart disease Brother   . Diabetes Sister     x 2  . Prostate cancer Brother   . Colon polyps      nephew  . Leukemia Sister      Vitals:  Blood pressure 129/70, pulse 66, temperature 97.9 F (36.6 C), temperature source Oral, resp. rate 20, height '5\' 1"'  (1.549 m), weight 144 lb 6.4 oz (65.499 kg).  Physical Exam:   Cordaryl Decelles A., MD   02/11/2014, 1:33 PM

## 2014-02-17 NOTE — Interval H&P Note (Signed)
History and Physical Interval Note:  02/17/2014 9:46 AM  [name]  has presented today for surgery, with the diagnosis of GRADE TWO ENDOMETRIOD,ENDOMETRIAL ADNO CARCINOMA  The various methods of treatment have been discussed with the patient and family. After consideration of risks, benefits and other options for treatment, the patient has consented to  Procedure(s): ROBOTIC ASSISTED TOTAL HYSTERECTOMY WITH BILATERAL SALPINGO OOPHORECTOMY AND LYMPH NODE DISSECTION (N/A) LYMPH NODE DISSECTION (N/A) as a surgical intervention .  The patient's history has been reviewed, patient examined, no change in status, stable for surgery.  I have reviewed the patient's chart and labs.  Questions were answered to the patient's satisfaction.     [place] A.

## 2014-02-17 NOTE — Progress Notes (Signed)
PACU note----pt's heart rate 40's, sinus bradycardia; Dr. Lissa Hoard, anesthes, notified and states he will come check pt

## 2014-02-17 NOTE — Transfer of Care (Signed)
Immediate Anesthesia Transfer of Care Note  Patient: [name]  Procedure(s) Performed: Procedure(s) (LRB): ROBOTIC ASSISTED TOTAL HYSTERECTOMY WITH BILATERAL SALPINGO OOPHORECTOMY  (N/A) LYMPH NODE DISSECTION (N/A)  Patient Location: PACU  Anesthesia Type: General  Level of Consciousness: sedated, patient cooperative and responds to stimulation  Airway & Oxygen Therapy: Patient Spontanous Breathing and Patient connected to face mask oxgen  Post-op Assessment: Report given to PACU RN and Post -op Vital signs reviewed and stable  Post vital signs: Reviewed and stable  Complications: No apparent anesthesia complications

## 2014-02-18 ENCOUNTER — Encounter (HOSPITAL_COMMUNITY): Payer: Self-pay | Admitting: Gynecologic Oncology

## 2014-02-18 DIAGNOSIS — C541 Malignant neoplasm of endometrium: Secondary | ICD-10-CM | POA: Diagnosis not present

## 2014-02-18 LAB — CBC
HCT: 28.8 % — ABNORMAL LOW (ref 36.0–46.0)
HEMOGLOBIN: 9.6 g/dL — AB (ref 12.0–15.0)
MCH: 30.2 pg (ref 26.0–34.0)
MCHC: 33.3 g/dL (ref 30.0–36.0)
MCV: 90.6 fL (ref 78.0–100.0)
Platelets: 168 10*3/uL (ref 150–400)
RBC: 3.18 MIL/uL — ABNORMAL LOW (ref 3.87–5.11)
RDW: 13.2 % (ref 11.5–15.5)
WBC: 8.3 10*3/uL (ref 4.0–10.5)

## 2014-02-18 LAB — BASIC METABOLIC PANEL
Anion gap: 10 (ref 5–15)
BUN: 25 mg/dL — AB (ref 6–23)
CHLORIDE: 103 meq/L (ref 96–112)
CO2: 23 mEq/L (ref 19–32)
Calcium: 8.8 mg/dL (ref 8.4–10.5)
Creatinine, Ser: 1.12 mg/dL — ABNORMAL HIGH (ref 0.50–1.10)
GFR, EST AFRICAN AMERICAN: 53 mL/min — AB (ref >90)
GFR, EST NON AFRICAN AMERICAN: 45 mL/min — AB (ref >90)
Glucose, Bld: 102 mg/dL — ABNORMAL HIGH (ref 70–99)
Potassium: 4.3 mEq/L (ref 3.7–5.3)
Sodium: 136 mEq/L — ABNORMAL LOW (ref 137–147)

## 2014-02-18 MED ORDER — DOCUSATE SODIUM 100 MG PO CAPS
100.0000 mg | ORAL_CAPSULE | Freq: Every day | ORAL | Status: DC
  Administered 2014-02-18 – 2014-02-19 (×2): 100 mg via ORAL
  Filled 2014-02-18 (×2): qty 1

## 2014-02-18 NOTE — Progress Notes (Signed)
1 Day Post-Op Procedure(s) (LRB): ROBOTIC ASSISTED TOTAL HYSTERECTOMY WITH BILATERAL SALPINGO OOPHORECTOMY  (N/A) LYMPH NODE DISSECTION (N/A)  Subjective: Patient reports doing well except for limited mobility with her left lower extremity post-op.  No pain reported.  Tolerating diet with no nausea or emesis reported.  Ambulating without difficulty.  Reporting stiffness from her upper thigh to her lower left leg.  "I had a blockage in that leg from a long time that I forgot to tell.  My leg keeps wanting to go to the left."  Able to abduct her leg but difficulty experienced with adducting the leg.  She is able to after several seconds and states mild pain is experienced in the hip region.  Able to bear weight when ambulating and no weakness reported in the left leg.  Minimal amount of blood from the vagina reported when getting out of bed for the first time but no other episodes reported.  Due to void.  Wanting to stay the night tonight due to concerns about her left leg and living alone.  Denies chest pain, dyspnea, passing flatus, or having a bowel movement.  No other concerns voiced.  Objective: Vital signs in last 24 hours: Temp:  [95 F (35 C)-98.1 F (36.7 C)] 97.5 F (36.4 C) (11/11 0547) Pulse Rate:  [41-65] 52 (11/11 0547) Resp:  [8-18] 16 (11/11 0547) BP: (100-139)/(41-69) 101/45 mmHg (11/11 0547) SpO2:  [95 %-100 %] 98 % (11/11 0732) Weight:  [144 lb 6 oz (65.488 kg)] 144 lb 6 oz (65.488 kg) (11/10 1012) Last BM Date: 02/16/14  Intake/Output from previous day: 11/10 0701 - 11/11 0700 In: 3150 [I.V.:3150] Out: 825 [Urine:775; Blood:50]  Physical Examination: General: alert, cooperative, no distress and mild tremor of the hands noted Resp: clear to auscultation bilaterally Cardio: S1, S2 normal and bradycardic at times, 64 during assessment GI: soft, non-tender; bowel sounds normal; no masses,  no organomegaly and incision: lap sites with steri strips clean and intact, no  erythema or drainage noted Extremities: Homans sign is negative, no sign of DVT, no edema, redness or tenderness in the calves or thighs and left lower extremity slow with adduction, able to flex the leg, no edema or tenderness in the legs, varicose vein noted the anterior portion of the left shin  Labs: WBC/Hgb/Hct/Plts:  8.3/9.6/28.8/168 (11/11 0405) BUN/Cr/glu/ALT/AST/amyl/lip:  25/1.12/--/--/--/--/-- (11/11 0405)  Assessment: 80 y.o. s/p Procedure(s): ROBOTIC ASSISTED TOTAL HYSTERECTOMY WITH BILATERAL SALPINGO OOPHORECTOMY  LYMPH NODE DISSECTION: stable Pain:  Pain is well-controlled on PRN medications.  Heme: Hgb 9.6 and Hct 28.8 this am.  Stable post-operatively.  CV: Bradycardic at times.  Hx HTN and CAD.  Current treatment: Avapro  GI:  Tolerating po: Yes     GU:  Adequate output reported.  FEN: Stable post-op  Prophylaxis: pharmacologic prophylaxis (with any of the following: enoxaparin (Lovenox) 40mg  SQ 2 hours prior to surgery then every day) and intermittent pneumatic compression boots.  Plan: PT to evaluate LLE  Saline lock IV Encourage ambulation, IS use, deep breathing, and coughing Continue post-op plan of care of Dr. Delsa Sale The patient is to be discharged to home.  Plan for tomorrow.   LOS: 1 day    Shamyah Stantz DEAL 02/18/2014, 8:37 AM

## 2014-02-18 NOTE — Plan of Care (Signed)
Problem: Phase II Progression Outcomes Goal: Vital signs stable Outcome: Completed/Met Date Met:  02/18/14     

## 2014-02-18 NOTE — Plan of Care (Signed)
Problem: Phase II Progression Outcomes Goal: Pain controlled Outcome: Completed/Met Date Met:  02/18/14     

## 2014-02-18 NOTE — Plan of Care (Signed)
Problem: Phase II Progression Outcomes Goal: Progress activity as tolerated unless otherwise ordered Outcome: Completed/Met Date Met:  02/18/14

## 2014-02-18 NOTE — Evaluation (Signed)
Physical Therapy One Time Evaluation Patient Details Name: [name] MRN: [id number] DOB: Feb 27, 1933 Today's Date: 02/18/2014   History of Present Illness  Pt is a 79 year old female s/p total robotic hysterectomy bilateral salpingo-oophorectomy, bilateral pelvic and para-aortic lymph node dissection.  Pt has hx of familial tremor.  Clinical Impression  Patient evaluated by Physical Therapy with no further acute PT needs identified. All education has been completed and the patient has no further questions. Pt presents with L hip weakness (see below) and would benefit from f/u HHPT upon d/c.  Pt agreeable to use [facility] for safety upon d/c and states her friend will assist her for 3-4 days upon return home.  Discussed practicing stepping over tub with friend assist prior to actual shower for safety, bird bath if having difficulty or feels unsafe. See below for any follow-up Physial Therapy or equipment needs. PT is signing off. Thank you for this referral.      Follow Up Recommendations Home health PT    Equipment Recommendations  None recommended by PT (pt declined RW, states she wouldn't use)    Recommendations for Other Services       Precautions / Restrictions Precautions Precautions: Fall      Mobility  Bed Mobility               General bed mobility comments: pt up in recliner on arrival  Transfers Overall transfer level: Needs assistance Equipment used: None Transfers: Sit to/from Stand Sit to Stand: Supervision         General transfer comment: verbal cues for UE self assist  Ambulation/Gait Ambulation/Gait assistance: Min guard;Supervision Ambulation Distance (Feet): 400 Feet Assistive device: None (IV pole) Gait Pattern/deviations: Step-through pattern;Decreased stride length     General Gait Details: increased toe out bilaterally however pt reports baseline, slight difficulty advancing but L LE, educated pt to use SPC in R hand at home for safety in  case L LE buckles or pt becomes unsteady  Financial trader Rank[employer] (Stroke Patients Only)       Balance                                             Pertinent Vitals/Pain Pain Assessment: No/denies pain    Home Living Family/patient expects to be discharged to:: Private residence Living Arrangements: Alone Available Help at Discharge: Friend(s) (friends to stay with pt upon d/c 3-4 days) Type of Home: House Home Access: Level entry     Home Layout: One level Home Equipment: Cane - single point      Prior Function Level of Independence: Independent               Hand Dominance        Extremity/Trunk Assessment               Lower Extremity Assessment: LLE deficits/detail   LLE Deficits / Details: pt with 2+/5 strength with hip flexion, abduction, and IR, reports sensation intact with dermatome testing     Communication   Communication: No difficulties  Cognition Arousal/Alertness: Awake/alert Behavior During Therapy: WFL for tasks assessed/performed Overall Cognitive Status: Within Functional Limits for tasks assessed  General Comments      Exercises        Assessment/Plan    PT Assessment All further PT needs can be met in the next venue of care  PT Diagnosis Other (comment) (L hip weakness)   PT Problem List Decreased strength  PT Treatment Interventions     PT Goals (Current goals can be found in the Care Plan section) Acute Rehab PT Goals PT Goal Formulation: All assessment and education complete, DC therapy    Frequency     Barriers to discharge        Co-evaluation               End of Session   Activity Tolerance: Patient tolerated treatment well Patient left: in chair;with call bell/phone within reach      Functional Assessment Tool Used: clinical judgement Functional Limitation: Mobility: Walking and moving around Mobility:  Walking and Moving Around Current Status ([id number]): At least 1 percent but less than 20 percent impaired, limited or restricted Mobility: Walking and Moving Around Goal Status [phone]): At least 1 percent but less than 20 percent impaired, limited or restricted Mobility: Walking and Moving Around Discharge Status [phone]): At least 1 percent but less than 20 percent impaired, limited or restricted    Time: 1109-1129 PT Time Calculation (min) (ACUTE ONLY): 20 min   Charges:   PT Evaluation $Initial PT Evaluation Tier I: 1 Procedure PT Treatments $Gait Training: 8-22 mins   PT G Codes:   Functional Assessment Tool Used: clinical judgement Functional Limitation: Mobility: Walking and moving around    [place] E 02/18/2014, 12:34 PM Carmelia Bake, PT, DPT 02/18/2014 Pager: [phone]

## 2014-02-19 DIAGNOSIS — C541 Malignant neoplasm of endometrium: Secondary | ICD-10-CM | POA: Diagnosis not present

## 2014-02-19 MED ORDER — OXYCODONE-ACETAMINOPHEN 5-325 MG PO TABS: 1.0000 | ORAL_TABLET | ORAL | Status: DC | PRN

## 2014-02-19 NOTE — Discharge Summary (Signed)
Physician Discharge Summary  Patient ID: [name] MRN: [id number] DOB/AGE: 1932-12-16 80 y.o.  Admit date: 02/17/2014 Discharge date: 02/19/2014  Admission Diagnoses: Endometrial cancer  Discharge Diagnoses:  Principal Problem:   Endometrial cancer   Discharged Condition:  The patient is in good condition and stable for discharge.  She will be discharged with home health PT per recommendations of PT inpatient.   Hospital Course: On 02/17/2014, the patient underwent the following: Procedure(s):  ROBOTIC ASSISTED TOTAL HYSTERECTOMY WITH BILATERAL SALPINGO OOPHORECTOMY LYMPH NODE DISSECTION.  The postoperative course was uneventful.  She was discharged to home on postoperative day 2 tolerating a regular diet and passing flatus.  LLE mobility improving.  Able to adduct and abduct the LLE and ambulating steadily without difficulty.  HH PT ordered per recommendations of PT inpatient.  Consults: PT  Significant Diagnostic Studies: None  Treatments: surgery: see above  Discharge Exam: Blood pressure 104/48, pulse 63, temperature 97.7 F (36.5 C), temperature source Oral, resp. rate 16, height 5\' 1"  (1.549 m), weight 144 lb 6 oz (65.488 kg), SpO2 95 %. General appearance: alert, cooperative and no distress Resp: clear to auscultation bilaterally Cardio: regular rate and rhythm, S1, S2 normal, no murmur, click, rub or gallop and pulse 62 GI: soft, non-tender; bowel sounds normal; no masses,  no organomegaly Extremities: extremities normal, atraumatic, no cyanosis or edema and scattered varicose veins bilaterally Incision/Wound: Lap sites with steri strips to the abdomen intact, no erythema or drainage noted  Disposition: 01-Home or Self Care      Discharge Instructions    Call MD for:  difficulty breathing, headache or visual disturbances    Complete by:  As directed      Call MD for:  extreme fatigue    Complete by:  As directed      Call MD for:  hives    Complete by:  As  directed      Call MD for:  persistant dizziness or light-headedness    Complete by:  As directed      Call MD for:  persistant nausea and vomiting    Complete by:  As directed      Call MD for:  redness, tenderness, or signs of infection (pain, swelling, redness, odor or green/yellow discharge around incision site)    Complete by:  As directed      Call MD for:  severe uncontrolled pain    Complete by:  As directed      Call MD for:  temperature >100.4    Complete by:  As directed      Diet - low sodium heart healthy    Complete by:  As directed      Driving Restrictions    Complete by:  As directed   No driving for 1 week if applicable.  Do not take narcotics and drive.     Face-to-face encounter (required for Medicare/Medicaid patients)    Complete by:  As directed   I Shellby Schlink [name] for Dr. Lahoma Crocker certify that this patient is under my care and that I, or a nurse practitioner or physician's assistant working with me, had a face-to-face encounter that meets the physician face-to-face encounter requirements with this patient on 02/19/2014. The encounter with the patient was in whole, or in part for the following medical condition(s) which is the primary reason for home health care (List medical condition): physical therapy post-op per PT recommendations inpatient for strength exercises due to LLE weakness post-op  The  encounter with the patient was in whole, or in part, for the following medical condition, which is the primary reason for home health care:  PT post-op  I certify that, based on my findings, the following services are medically necessary home health services:  Physical therapy  Reason for Medically Necessary Home Health Services:  Therapy- Therapeutic Exercises to Increase Strength and Endurance  My clinical findings support the need for the above services:  Unable to leave home safely without assistance and/or assistive device  Further, I certify that my  clinical findings support that this patient is homebound due to:  Unable to leave home safely without assistance     Home Health    Complete by:  As directed   To provide the following care/treatments:  PT     Increase activity slowly    Complete by:  As directed      Lifting restrictions    Complete by:  As directed   No lifting greater than 10 lbs.     Sexual Activity Restrictions    Complete by:  As directed   No sexual activity, nothing in the vagina, for 8 weeks.            Medication List    TAKE these medications        acetaminophen 325 MG tablet  Commonly known as:  TYLENOL  Take 325-650 mg by mouth every 6 (six) hours as needed for mild pain or headache.     oxyCODONE-acetaminophen 5-325 MG per tablet  Commonly known as:  PERCOCET/ROXICET  Take 1-2 tablets by mouth every 4 (four) hours as needed for severe pain (moderate to severe pain).     valsartan 80 MG tablet  Commonly known as:  DIOVAN  Take 80 mg by mouth every morning.       Follow-up Information    Follow up with [facility] MD On 02/26/2014.   Specialty:  Gynecologic Oncology   Why:  at 1:15 pm at the [place] information:   [place] [place] [address] [phone]       Follow up with [facility].   Why:  physical therapy   Contact information:   [address] [place] [address] [phone]       Greater than thirty minutes were spend for face to face discharge instructions and discharge orders/summary in EPIC.   Signed: Luria Rosario [name] 02/19/2014, 9:49 AM

## 2014-02-19 NOTE — Discharge Instructions (Signed)
02/19/2014  Return to work: 4-6 weeks if applicable  Activity: 1. Be up and out of the bed during the day.  Take a nap if needed.  You may walk up steps but be careful and use the hand rail.  Stair climbing will tire you more than you think, you may need to stop part way and rest.   2. No lifting or straining for 6 weeks.  3. No driving for 1 week(s).  Do not drive if you are taking narcotic pain medicine.  4. Shower daily.  Use soap and water on your incision and pat dry; don't rub.  No tub baths until cleared by your surgeon.   5. No sexual activity and nothing in the vagina for 8 weeks.  Diet: 1. Low sodium Heart Healthy Diet is recommended.  2. It is safe to use a laxative, such as Miralax or Colace, if you have difficulty moving your bowels.   Wound Care: 1. Keep clean and dry.  Shower daily.  Reasons to call the Doctor:  Fever - Oral temperature greater than 100.4 degrees Fahrenheit  Foul-smelling vaginal discharge  Difficulty urinating  Nausea and vomiting  Increased pain at the site of the incision that is unrelieved with pain medicine.  Difficulty breathing with or without chest pain  New calf pain especially if only on one side  Sudden, continuing increased vaginal bleeding with or without clots.   Contacts: For questions or concerns you should contact:  Dr. Alycia Rossetti at [place]  Dr. Lahoma Crocker at [phone]  Dr. Everitt Amber at [phone]  Joylene John, NP at [phone]  After Hours: call [phone] and ask for the GYN Oncologist on call  Acetaminophen; Oxycodone tablets What is this medicine? ACETAMINOPHEN; OXYCODONE (a set a MEE noe fen; ox i KOE done) is a pain reliever. It is used to treat mild to moderate pain. This medicine may be used for other purposes; ask your health care provider or pharmacist if you have questions. COMMON BRAND NAME(S): Endocet, Magnacet, Narvox, Percocet, Perloxx, Primalev, Primlev, Roxicet,  Xolox What should I tell my health care provider before I take this medicine? They need to know if you have any of these conditions: -brain tumor -Crohn's disease, inflammatory bowel disease, or ulcerative colitis -drug abuse or addiction -head injury -heart or circulation problems -if you often drink alcohol -kidney disease or problems going to the bathroom -liver disease -lung disease, asthma, or breathing problems -an unusual or allergic reaction to acetaminophen, oxycodone, other opioid analgesics, other medicines, foods, dyes, or preservatives -pregnant or trying to get pregnant -breast-feeding How should I use this medicine? Take this medicine by mouth with a full glass of water. Follow the directions on the prescription label. Take your medicine at regular intervals. Do not take your medicine more often than directed. Talk to your pediatrician regarding the use of this medicine in children. Special care may be needed. Patients over 66 years old may have a stronger reaction and need a smaller dose. Overdosage: If you think you have taken too much of this medicine contact a poison control center or emergency room at once. NOTE: This medicine is only for you. Do not share this medicine with others. What if I miss a dose? If you miss a dose, take it as soon as you can. If it is almost time for your next dose, take only that dose. Do not take double or extra doses. What may interact with this medicine? -alcohol -antihistamines -barbiturates like amobarbital, butalbital,  butabarbital, methohexital, pentobarbital, phenobarbital, thiopental, and secobarbital -benztropine -drugs for bladder problems like solifenacin, trospium, oxybutynin, tolterodine, hyoscyamine, and methscopolamine -drugs for breathing problems like ipratropium and tiotropium -drugs for certain stomach or intestine problems like propantheline, homatropine methylbromide, glycopyrrolate, atropine, belladonna, and  dicyclomine -general anesthetics like etomidate, ketamine, nitrous oxide, propofol, desflurane, enflurane, halothane, isoflurane, and sevoflurane -medicines for depression, anxiety, or psychotic disturbances -medicines for sleep -muscle relaxants -naltrexone -narcotic medicines (opiates) for pain -phenothiazines like perphenazine, thioridazine, chlorpromazine, mesoridazine, fluphenazine, prochlorperazine, promazine, and trifluoperazine -scopolamine -tramadol -trihexyphenidyl This list may not describe all possible interactions. Give your health care provider a list of all the medicines, herbs, non-prescription drugs, or dietary supplements you use. Also tell them if you smoke, drink alcohol, or use illegal drugs. Some items may interact with your medicine. What should I watch for while using this medicine? Tell your doctor or health care professional if your pain does not go away, if it gets worse, or if you have new or a different type of pain. You may develop tolerance to the medicine. Tolerance means that you will need a higher dose of the medication for pain relief. Tolerance is normal and is expected if you take this medicine for a long time. Do not suddenly stop taking your medicine because you may develop a severe reaction. Your body becomes used to the medicine. This does NOT mean you are addicted. Addiction is a behavior related to getting and using a drug for a non-medical reason. If you have pain, you have a medical reason to take pain medicine. Your doctor will tell you how much medicine to take. If your doctor wants you to stop the medicine, the dose will be slowly lowered over time to avoid any side effects. You may get drowsy or dizzy. Do not drive, use machinery, or do anything that needs mental alertness until you know how this medicine affects you. Do not stand or sit up quickly, especially if you are an older patient. This reduces the risk of dizzy or fainting spells. Alcohol may  interfere with the effect of this medicine. Avoid alcoholic drinks. There are different types of narcotic medicines (opiates) for pain. If you take more than one type at the same time, you may have more side effects. Give your health care provider a list of all medicines you use. Your doctor will tell you how much medicine to take. Do not take more medicine than directed. Call emergency for help if you have problems breathing. The medicine will cause constipation. Try to have a bowel movement at least every 2 to 3 days. If you do not have a bowel movement for 3 days, call your doctor or health care professional. Do not take Tylenol (acetaminophen) or medicines that have acetaminophen with this medicine. Too much acetaminophen can be very dangerous. Many nonprescription medicines contain acetaminophen. Always read the labels carefully to avoid taking more acetaminophen. What side effects may I notice from receiving this medicine? Side effects that you should report to your doctor or health care professional as soon as possible: -allergic reactions like skin rash, itching or hives, swelling of the face, lips, or tongue -breathing difficulties, wheezing -confusion -light headedness or fainting spells -severe stomach pain -unusually weak or tired -yellowing of the skin or the whites of the eyes Side effects that usually do not require medical attention (report to your doctor or health care professional if they continue or are bothersome): -dizziness -drowsiness -nausea -vomiting This list may not describe all possible  side effects. Call your doctor for medical advice about side effects. You may report side effects to FDA at 1-800-FDA-1088. Where should I keep my medicine? Keep out of the reach of children. This medicine can be abused. Keep your medicine in a safe place to protect it from theft. Do not share this medicine with anyone. Selling or giving away this medicine is dangerous and against the  law. Store at room temperature between 20 and 25 degrees C (68 and 77 degrees F). Keep container tightly closed. Protect from light. This medicine may cause accidental overdose and death if it is taken by other adults, children, or pets. Flush any unused medicine down the toilet to reduce the chance of harm. Do not use the medicine after the expiration date. NOTE: This sheet is a summary. It may not cover all possible information. If you have questions about this medicine, talk to your doctor, pharmacist, or health care provider.  2015, [employer] Standard. (2012-11-18 13:17:35)

## 2014-02-19 NOTE — Progress Notes (Signed)
Pt still has some difficulty moving Left leg from side to side.  Seems to have to draw it up first then with discomfort able to turn it to the right side.  Cyndia Diver, RN

## 2014-02-19 NOTE — Care Management Note (Signed)
    Page 1 of 1   02/19/2014     11:28:29 AM CARE MANAGEMENT NOTE 02/19/2014  Patient:  [name], [name]   Account Number:  [id number]  Date Initiated:  02/19/2014  Documentation initiated by:  Sunday Spillers  Subjective/Objective Assessment:   80 yo female admitted s/p Total robotic hysterectomy BSO, bilateral pelvic and para-aortic lymph node dissection. PTA lived at home alone.     Action/Plan:   Home when stable, has a friend who will assist for several days after d/c.   Anticipated DC Date:  02/19/2014   Anticipated DC Plan:  [place]  CM consult      [facility] Choice  HOME HEALTH   Choice offered to / List presented to:  C-1 Patient        [place] arranged  [facility]      [place].   Status of service:  Completed, signed off Medicare Important Message given?   (If response is "NO", the following Medicare IM given date fields will be blank) Date Medicare IM given:   Medicare IM given by:   Date Additional Medicare IM given:   Additional Medicare IM given by:    Discharge Disposition:  [place]  Per UR Regulation:  Reviewed for med. necessity/level of care/duration of stay  If discussed at [place] of Stay Meetings, dates discussed:    Comments:  02-19-14 Sunday Spillers RN CM Contacted [place] to arrange [facility], orders for PT entered.

## 2014-02-24 ENCOUNTER — Telehealth: Payer: Self-pay | Admitting: Gynecologic Oncology

## 2014-02-24 NOTE — Telephone Encounter (Signed)
Post op telephone call to check patient status.  Patient stating she cancelled PT by mistake and she would like to proceed with [facility] PT.  Stating she is ambulating without difficulty, able to abduct and adduct her LLE, but has difficulty lifting the leg and getting up from a low position post-operatively.  No weakness reported in the leg.  [facility] contacted and HHPT ordered initiated.  Adequate PO intake reported.  Bowels and bladder functioning without difficulty.  Pain minimal.  Reportable signs and symptoms reviewed.  Follow up appt arranged.  Advised to call for any questions or concerns.

## 2014-02-26 ENCOUNTER — Other Ambulatory Visit: Payer: Self-pay | Admitting: Gynecologic Oncology

## 2014-02-26 ENCOUNTER — Encounter: Payer: Self-pay | Admitting: Gynecologic Oncology

## 2014-02-26 ENCOUNTER — Ambulatory Visit: Payer: Medicare Other | Attending: Gynecologic Oncology | Admitting: Gynecologic Oncology

## 2014-02-26 VITALS — BP 136/98 | HR 79 | Temp 97.9°F | Resp 20 | Ht 61.0 in | Wt 141.8 lb

## 2014-02-26 DIAGNOSIS — N281 Cyst of kidney, acquired: Secondary | ICD-10-CM | POA: Diagnosis not present

## 2014-02-26 DIAGNOSIS — Z9049 Acquired absence of other specified parts of digestive tract: Secondary | ICD-10-CM | POA: Diagnosis not present

## 2014-02-26 DIAGNOSIS — C541 Malignant neoplasm of endometrium: Secondary | ICD-10-CM | POA: Diagnosis present

## 2014-02-26 DIAGNOSIS — Z79899 Other long term (current) drug therapy: Secondary | ICD-10-CM | POA: Diagnosis not present

## 2014-02-26 DIAGNOSIS — R531 Weakness: Secondary | ICD-10-CM | POA: Diagnosis not present

## 2014-02-26 DIAGNOSIS — I2510 Atherosclerotic heart disease of native coronary artery without angina pectoris: Secondary | ICD-10-CM | POA: Insufficient documentation

## 2014-02-26 DIAGNOSIS — Z1504 Genetic susceptibility to malignant neoplasm of endometrium: Secondary | ICD-10-CM | POA: Diagnosis not present

## 2014-02-26 DIAGNOSIS — Z800 Family history of malignant neoplasm of digestive organs: Secondary | ICD-10-CM

## 2014-02-26 DIAGNOSIS — K5901 Slow transit constipation: Secondary | ICD-10-CM

## 2014-02-26 MED ORDER — POLYETHYLENE GLYCOL 3350 17 G PO PACK: 17.0000 g | PACK | Freq: Every day | ORAL | Status: DC

## 2014-02-26 NOTE — Progress Notes (Signed)
Consult Note: Gyn-Onc  CC: Dr Harle Battiest Chief Complaint  Patient presents with  . endometrial cancer    Assessment/Plan:  Ms. AMITIEL CRONIN  is a 80 y.o. 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 She also has a concerning family history of colon cancer (sister, aunt and uncle) which raises the possiblity of an underlying Lynch syndrome diagnosis.   Her final pathology is a stage IA grade 2 endometrioid adenocarcinoma with 66% myometrial invasion. 0 out of 12 lymph nodes were involved and there was no lymphovascular space involvement. Per GOG 99 she would benefit from vaginal cuff brachii therapy and we will get her scheduled to see Dr. Sondra Come. She has some weakness in the femoral nerve distribution. It is on the left side in its improving with physical therapy. I reviewed this with the patient and her niece in the etiology of this most likely related to positioning and lymphadenectomy. She'll return to see me in the next few weeks for a postoperative check. We'll try to coordinate this with an appointment with Dr. Freddi Che. She's requesting a prescription for marrow laxatives that helped her significantly with her bowel movements in the postoperative setting.   HPI: Ms Karson is a 80 year old very healthy woman with a 4-6 month history of abnormal bloody vaginal discharge. She is nulliparous and virginal. She lives alone. She was seen by her gynecologist Dr Gus Height who performed a transvaginal US on 12/11/13 which showed a 6.6x3.8x3x7cm uterus with an endometrial thickness of 73mm. The ovaries were not visualized.  Dr Harrington Challenger performed a D&C on 12/18/13 which revealed moderately differentiated (FIGO grade II) endometrioid endometrial adenocarcinoma.  She has a strong family history of colon caner (sister, aunt and uncle). Her last colonoscopy revealed benign polyps.   She is virginal and nulliparous  Interval History: She has some mild  cramping in the lower pelvis. Her bleeding is mild/spotting and stable. She denies lower extremity edema.  She and I discussed DO NOT RESUSCITATE and DO NOT INTUBATE status. I discussed with her that that would need to be held during the time of surgery and she is happy to do that. She does not currently have a DNR/DNI in effect but it is something that she has considered doing after her surgery.  Her CT scan reveals: EXAM: CT ABDOMEN AND PELVIS WITH CONTRAST  TECHNIQUE: Multidetector CT imaging of the abdomen and pelvis was performed using the standard protocol following bolus administration of intravenous contrast.  CONTRAST: 67mL OMNIPAQUE IOHEXOL 300 MG/ML SOLN  COMPARISON: 06/02/2013  FINDINGS: Lower chest: Clear lung bases. Mild cardiomegaly, accentuated by a pectus excavatum deformity. Right coronary artery atherosclerosis. No pericardial or pleural effusion.  Hepatobiliary: Normal liver. Cholecystectomy, without biliary ductal dilatation.  Pancreas: Normal, without mass or pancreatic ductal dilatation.  Spleen: Normal  Adrenals/Urinary Tract: Normal adrenal glands. Mild bilateral renal cortical atrophy. Bilateral multiple renal cysts and too small to characterize lesions. An interpolar left renal lesion measures fluid density centrally but demonstrates minimal increased density in its dependent portion on image 36 of series 2. 1.5 cm. No hydronephrosis. Normal urinary bladder.  Stomach/Bowel: Normal stomach, without wall thickening. A moderate size descending duodenal diverticulum. Extensive colonic diverticulosis with muscular hypertrophy involving the sigmoid. Normal terminal ileum. Otherwise normal appearance of the small bowel.  Vascular/Lymphatic: Atherosclerosis, within the aorta and at the origin of both renal arteries. No retroperitoneal or retrocrural adenopathy. No pelvic adenopathy.  Reproductive: Moderate pelvic floor laxity. Retroverted  uterus. Central  hypoattenuation with surrounding hyper enhancement involving the uterine fundus. This is retroverted, including on sagittal image 53. No gross extension outside of the uterus.  Other: No evidence of omental or peritoneal disease. No abdominal pelvic fluid.  Musculoskeletal: Degenerative sclerosis involving the symphysis pubis. Right hip osteoarthritis. Lumbar spondylosis.  IMPRESSION: 1. Uterine fundal hyper enhancement and central hypoattenuation, possibly representing the primary endometrial carcinoma. No gross evidence of extrauterine spread and no evidence of nodal metastasis. 2. Pelvic floor laxity. 3. Bilateral renal cortical thinning with presumed cysts and a minimally complex interpolar left renal cyst. 4. Atherosclerosis, including within the coronary arteries.  02/17/14: Surgery: Total robotic hysterectomy bilateral salpingo-oophorectomy, bilateral pelvic and para-aortic lymph node dissection.  Operative findings: Narrow vaginal introitus. Normal appearing uterus, cervix and adnexa. No obvious evidence of metastatic disease. Normal appearing appendix. Adhesive disease of the omentum to the anterior abdominal wall.  Diagnosis 1. Lymph node, biopsy, para-aortic, right - THREE LYMPH NODES, NEGATIVE FOR METASTATIC CARCINOMA (0/3). 2. Lymph node, biopsy, para-aortic, left - ONE LYMPH NODE, NEGATIVE FOR METASTATIC CARCINOMA (0/1). 3. Uterus +/- tubes/ovaries, neoplastic, with cervix - INVASIVE ENDOMETRIOID CARCINOMA, FIGO GRADE II, 2.6 CM INVADING INTO THE OUTER HALF OF THE MYOMETRIUM. - NO EVIDENCE OF ANGIOLYMPHATIC INVASION IDENTIFIED. - BILATERAL OVARIES: BENIGN OVARIAN TISSUE WITH ENDOSALPINGIOSIS, NO ATYPIA OR MALIGNANCY. - BILATERAL FALLOPIAN TUBES: BENIGN FALLOPIAN TUBAL TISSUE, NO EVIDENCE OF ATYPIA OR MALIGNANCY. - CERVIX: BENIGN ENDOCERVICAL MUCOSA, NO DYSPLASIA OR MALIGNANCY. 4. Lymph node, biopsy, right pelvic - SIX LYMPH NODES, NEGATIVE FOR METASTATIC CARCINOMA  (0/6). 5. Lymph node, biopsy, left pelvic - TWO LYMPH NODES, NEGATIVE FOR METASTATIC CARCINOMA (0/2).  She comes in today for brief postoperative check. She does have some weakness in the distribution of the femoral nerve. She is seeing physical therapy. She states is getting better with the exercises that she is doing. Physical therapy was at her house yesterday. She's comes accompanied today by her niece. She's eating well she's had no pain and no bleeding. We reviewed her pathology and the need for vaginal cuff brachii therapy per GOG 99. She and her family are in agreement.  Current Meds:  Outpatient Encounter Prescriptions as of 02/26/2014  Medication Sig  . acetaminophen (TYLENOL) 325 MG tablet Take 325-650 mg by mouth every 6 (six) hours as needed for mild pain or headache.  . polyethylene glycol (MIRALAX / GLYCOLAX) packet Take 17 g by mouth daily.  . valsartan (DIOVAN) 80 MG tablet Take 80 mg by mouth every morning.   . [DISCONTINUED] ALPRAZolam (XANAX) 0.5 MG tablet   . [DISCONTINUED] oxyCODONE-acetaminophen (PERCOCET/ROXICET) 5-325 MG per tablet Take 1-2 tablets by mouth every 4 (four) hours as needed for severe pain (moderate to severe pain).    Allergy:  Allergies  Allergen Reactions  . Benadryl [Diphenhydramine Hcl (Sleep)] Other (See Comments)    Light headed, heart racing.   . Ciprofloxacin Other (See Comments)    Leg pains  . Pravastatin Other (See Comments)    Leg cramps  . Vioxx [Rofecoxib] Nausea Only  . Cephalosporins Rash    Social Hx:   History   Social History  . Marital Status: Single    Spouse Name: N/A    Number of Children: 0  . Years of Education: N/A   Occupational History  . retired    Social History Main Topics  . Smoking status: Never Smoker   . Smokeless tobacco: Never Used  . Alcohol Use: No  . Drug Use: No  .  Sexual Activity: No   Other Topics Concern  . Not on file   Social History Narrative    Past Surgical Hx:  Past  Surgical History  Procedure Laterality Date  . Tonsillectomy and adenoidectomy  1958  . Lumbar disc surgery  1998    right L1-L2  . Cholecystectomy, laparoscopic  2002  . Rotator cuff repair  2003    bilateral  . Tendon graft  2003    with bone graft  . Skin biopsy      four times last 2005 - nose cancer  . Cataract extraction  2005    bilateral  . Cataract extraction w/ intraocular lens  implant, bilateral    . Familiar tremor    . Cholecystectomy    . Wisdom tooth extraction    . Eye surgery    . Colonoscopy    . Hysteroscopy w/d&c N/A 12/18/2013    Procedure: DILATATION AND CURETTAGE /HYSTEROSCOPY;  Surgeon: Gus Height, MD;  Location: [facility];  Service: Gynecology;  Laterality: N/A;  . Robotic assisted total hysterectomy with bilateral salpingo oopherectomy N/A 02/17/2014    Procedure: ROBOTIC ASSISTED TOTAL HYSTERECTOMY WITH BILATERAL SALPINGO OOPHORECTOMY ;  Surgeon: Imagene Gurney A. Alycia Rossetti, MD;  Location: [facility];  Service: Gynecology;  Laterality: N/A;  . Lymph node dissection N/A 02/17/2014    Procedure: LYMPH NODE DISSECTION;  Surgeon: Imagene Gurney A. Alycia Rossetti, MD;  Location: [facility];  Service: Gynecology;  Laterality: N/A;    Past Medical Hx:  Past Medical History  Diagnosis Date  . Diverticulosis of colon (without mention of hemorrhage)   . Unspecified gastritis and gastroduodenitis without mention of hemorrhage   . Hypertension   . Hypercholesterolemia     diet controlled  . TIA (transient ischemic attack)     years ago  . Colitis, ischemic   . CAD (coronary artery disease)   . Esophageal stricture   . History of gallstones   . History of kidney stones     passed stones no surgery required  . Obesity   . Familial tremor   . Varicose veins     lower legs  . Confusion     age related, denies dementia  . Neuromuscular disorder     bilateral hand temor -   . Arthritis     hands  . Basal cell carcinoma of nose   . Endometrial ca dx'd 12/2013    Past Gynecological History:   Virginal, nulliparous, menopause in her 51's  No LMP recorded. Patient is postmenopausal.  Family Hx:  Family History  Problem Relation Age of Onset  . Heart disease Sister   . Diabetes Brother     x 4  . Colon cancer Sister   . Crohn's disease      nephew  . Colon cancer Maternal Aunt   . Colon cancer Maternal Uncle   . Heart disease Brother   . Diabetes Sister     x 2  . Prostate cancer Brother   . Colon polyps      nephew  . Leukemia Sister      Vitals:  Blood pressure 136/98, pulse 79, temperature 97.9 F (36.6 C), temperature source Oral, resp. rate 20, height 5\' 1"  (1.549 m), weight 141 lb 12.8 oz (64.32 kg).  Physical Exam:  Well-nourished well-developed female in no acute distress.  Abdomen: Steri-Strips were removed. She has well-healed surgical incisions. There is some bruising that is improving surrounding the incisions. Abdomen is soft and nontender.  Extremities: No edema.  She has weakness in the distribution of the femoral nerve. Her strength is 3+ out of 5 on the left. Jaclene Bartelt A., MD   02/26/2014, 2:01 PM

## 2014-02-26 NOTE — Patient Instructions (Signed)
Please return to see Dr. Alycia Rossetti on December 16 at 76. You have an appointment the same day to see Dr. Sondra Come in radiation oncology at 9:30.

## 2014-03-20 NOTE — Progress Notes (Addendum)
GYN Location of Tumor / Histology: stage IA grade 2 endometrioid adenocarcinoma with 66% myometrial invasion  [name] presented with a 4-6 month history of abnormal bloody vaginal discharge.  Biopsies revealed:  12/18/13 Diagnosis Endometrium, curettage - ENDOMETRIAL ADENOCARCINOMA.  02/17/14 Diagnosis 1. Lymph node, biopsy, para-aortic, right - THREE LYMPH NODES, NEGATIVE FOR METASTATIC CARCINOMA (0/3). 2. Lymph node, biopsy, para-aortic, left - ONE LYMPH NODE, NEGATIVE FOR METASTATIC CARCINOMA (0/1). 3. Uterus +/- tubes/ovaries, neoplastic, with cervix - INVASIVE ENDOMETRIOID CARCINOMA, FIGO GRADE II, 2.6 CM INVADING INTO THE OUTER HALF OF THE MYOMETRIUM. - NO EVIDENCE OF ANGIOLYMPHATIC INVASION IDENTIFIED. - BILATERAL OVARIES: BENIGN OVARIAN TISSUE WITH ENDOSALPINGIOSIS, NO ATYPIA OR MALIGNANCY. - BILATERAL FALLOPIAN TUBES: BENIGN FALLOPIAN TUBAL TISSUE, NO EVIDENCE OF ATYPIA OR MALIGNANCY. - CERVIX: BENIGN ENDOCERVICAL MUCOSA, NO DYSPLASIA OR MALIGNANCY. 4. Lymph node, biopsy, right pelvic - SIX LYMPH NODES, NEGATIVE FOR METASTATIC CARCINOMA (0/6). 5. Lymph node, biopsy, left pelvic - TWO LYMPH NODES, NEGATIVE FOR METASTATIC CARCINOMA (0/2).  Past/Anticipated interventions by Gyn/Onc surgery, if any: 12/18/13 - Procedure: DILATATION AND CURETTAGE /HYSTEROSCOPY;  Surgeon: Gus Height, MD;  Location: [facility];  Service: Gynecology;  Laterality: N/A; 02/17/14 - Procedure: ROBOTIC ASSISTED TOTAL HYSTERECTOMY WITH BILATERAL SALPINGO OOPHORECTOMY and Procedure: LYMPH NODE DISSECTION;  Surgeon: Imagene Gurney A. Alycia Rossetti, MD;  Location: [facility];  Service: Gynecology;  Laterality: N/A;  Past/Anticipated interventions by medical oncology, if any: no  Weight changes, if any: no  Bowel/Bladder complaints, if any: has to strain with bowel movements - has stopped taking miralax.  Denies any bladder issues.  Nausea/Vomiting, if any: no  Pain issues, if any:  Has a "heaviness" in her left lower  abdomen.  SAFETY ISSUES:  Prior radiation? no  Pacemaker/ICD? no  Possible current pregnancy? no  Is the patient on methotrexate? no  Current Complaints / other details:  Patient is here with her niece.  She does not have any children.  Reports she has trouble with moving her left leg and has a heavy feeling in her left lower abdomen.  She reports that a nerve was irritated during surgery.  She has had PT.  She has a small, scabed area on her left lower abdomen still healing from her surgery.

## 2014-03-25 ENCOUNTER — Encounter: Payer: Self-pay | Admitting: Gynecologic Oncology

## 2014-03-25 ENCOUNTER — Ambulatory Visit
Admission: RE | Admit: 2014-03-25 | Discharge: 2014-03-25 | Disposition: A | Discharge status: Home / Self Care | Payer: Medicare Other | Source: Ambulatory Visit | Attending: Radiation Oncology | Admitting: Radiation Oncology

## 2014-03-25 ENCOUNTER — Ambulatory Visit: Payer: Medicare Other | Attending: Gynecologic Oncology | Admitting: Gynecologic Oncology

## 2014-03-25 ENCOUNTER — Encounter: Payer: Self-pay | Admitting: Radiation Oncology

## 2014-03-25 VITALS — BP 112/65 | HR 54 | Temp 97.7°F | Resp 16 | Ht 61.0 in | Wt 140.0 lb

## 2014-03-25 VITALS — BP 133/77 | HR 57 | Temp 97.8°F | Resp 20 | Ht 61.0 in | Wt 139.0 lb

## 2014-03-25 DIAGNOSIS — Z8673 Personal history of transient ischemic attack (TIA), and cerebral infarction without residual deficits: Secondary | ICD-10-CM | POA: Insufficient documentation

## 2014-03-25 DIAGNOSIS — E2749 Other adrenocortical insufficiency: Secondary | ICD-10-CM | POA: Insufficient documentation

## 2014-03-25 DIAGNOSIS — K5750 Diverticulosis of both small and large intestine without perforation or abscess without bleeding: Secondary | ICD-10-CM | POA: Diagnosis not present

## 2014-03-25 DIAGNOSIS — I517 Cardiomegaly: Secondary | ICD-10-CM | POA: Insufficient documentation

## 2014-03-25 DIAGNOSIS — Z90722 Acquired absence of ovaries, bilateral: Secondary | ICD-10-CM | POA: Insufficient documentation

## 2014-03-25 DIAGNOSIS — Z90710 Acquired absence of both cervix and uterus: Secondary | ICD-10-CM | POA: Insufficient documentation

## 2014-03-25 DIAGNOSIS — M47896 Other spondylosis, lumbar region: Secondary | ICD-10-CM | POA: Insufficient documentation

## 2014-03-25 DIAGNOSIS — M6289 Other specified disorders of muscle: Secondary | ICD-10-CM | POA: Insufficient documentation

## 2014-03-25 DIAGNOSIS — Z9049 Acquired absence of other specified parts of digestive tract: Secondary | ICD-10-CM | POA: Insufficient documentation

## 2014-03-25 DIAGNOSIS — M1611 Unilateral primary osteoarthritis, right hip: Secondary | ICD-10-CM | POA: Diagnosis not present

## 2014-03-25 DIAGNOSIS — R531 Weakness: Secondary | ICD-10-CM | POA: Insufficient documentation

## 2014-03-25 DIAGNOSIS — I700 Atherosclerosis of aorta: Secondary | ICD-10-CM | POA: Insufficient documentation

## 2014-03-25 DIAGNOSIS — Z800 Family history of malignant neoplasm of digestive organs: Secondary | ICD-10-CM | POA: Insufficient documentation

## 2014-03-25 DIAGNOSIS — I10 Essential (primary) hypertension: Secondary | ICD-10-CM | POA: Insufficient documentation

## 2014-03-25 DIAGNOSIS — Q676 Pectus excavatum: Secondary | ICD-10-CM | POA: Insufficient documentation

## 2014-03-25 DIAGNOSIS — N281 Cyst of kidney, acquired: Secondary | ICD-10-CM | POA: Diagnosis not present

## 2014-03-25 DIAGNOSIS — C541 Malignant neoplasm of endometrium: Secondary | ICD-10-CM | POA: Diagnosis present

## 2014-03-25 DIAGNOSIS — I2510 Atherosclerotic heart disease of native coronary artery without angina pectoris: Secondary | ICD-10-CM | POA: Insufficient documentation

## 2014-03-25 DIAGNOSIS — Z79899 Other long term (current) drug therapy: Secondary | ICD-10-CM | POA: Diagnosis not present

## 2014-03-25 DIAGNOSIS — N854 Malposition of uterus: Secondary | ICD-10-CM | POA: Insufficient documentation

## 2014-03-25 DIAGNOSIS — E780 Pure hypercholesterolemia: Secondary | ICD-10-CM | POA: Insufficient documentation

## 2014-03-25 DIAGNOSIS — Z9079 Acquired absence of other genital organ(s): Secondary | ICD-10-CM | POA: Diagnosis not present

## 2014-03-25 NOTE — Patient Instructions (Signed)
We will see you in 6 months. Call us sooner if you have any questions or concerns. Happy Holidays!!

## 2014-03-25 NOTE — Progress Notes (Signed)
Please see the Nurse Progress Note in the MD Initial Consult Encounter for this patient. 

## 2014-03-25 NOTE — Progress Notes (Signed)
Radiation Oncology         [phone] ________________________________  Initial Outpatient Consultation  Name: [name] MRN: [id number]  Date: 03/25/2014  DOB: 06/17/1932  PP:[id number] A, MD  Everitt Amber, MD   REFERRING PHYSICIAN: Nancy Marus, MD  DIAGNOSIS:  Stage IA grade 2 endometrioid adenocarcinoma with 66% myometrial invasion  HISTORY OF PRESENT ILLNESS::[name] is a 80 y.o. female who is seen out courtesy of Dr. Alycia Rossetti for an opinion concerning radiation therapy as part of management of patient's recently diagnosed endometrial cancer. Earlier this year the patient developed some mild vaginal spotting. She was seen by Dr. Harrington Challenger and [facility] revealed grade 2 endometrioid adenocarcinoma. Patient was seen by Dr. Alycia Rossetti and staging workup initiated. A CT scan of the abdomen and pelvis revealed some enhancement within the uterus but no evidence of spread outside the uterus. The patient was taken to the operating room on 02/17/2014 at which time the patient underwent total robotic hysterectomy, bilateral salpingo-oophorectomy, bilateral pelvic and periaortic lymph node dissection. Patient remained in the hospital 2 days after surgery. Final pathology revealed grade FIGO grade 2 invasive endometrioid carcinoma measuring 2.6 cm. The tumor extended to 1 cm of the myometrium with a thickness of 1.5 cm (66%). No LVI or cervical stromal involvement was found. The patient had 12 lymph nodes removed none of which showed metastasis. TNM staging was pT1b, pN 0 with FIGO stage IB. patient is done well since her surgert except for some mild weakness in her left lower extremity which is improving. Patient is now seen in radiation oncology for consideration for adjuvant treatment.  PREVIOUS RADIATION THERAPY: No  PAST MEDICAL HISTORY:  has a past medical history of Diverticulosis of colon (without mention of hemorrhage); Unspecified gastritis and gastroduodenitis without mention of hemorrhage;  Hypertension; Hypercholesterolemia; TIA (transient ischemic attack); Colitis, ischemic; CAD (coronary artery disease); Esophageal stricture; History of gallstones; History of kidney stones; Obesity; Familial tremor; Varicose veins; Neuromuscular disorder; Arthritis; Basal cell carcinoma of nose; and Endometrial ca (dx'd 12/2013).    PAST SURGICAL HISTORY: Past Surgical History  Procedure Laterality Date  . Tonsillectomy and adenoidectomy  1958  . Lumbar disc surgery  1998    right L1-L2  . Cholecystectomy, laparoscopic  2002  . Rotator cuff repair  2003    bilateral  . Tendon graft  2003    with bone graft  . Skin biopsy      four times last 2005 - nose cancer  . Cataract extraction  2005    bilateral  . Cataract extraction w/ intraocular lens  implant, bilateral    . Familiar tremor    . Cholecystectomy    . Wisdom tooth extraction    . Eye surgery    . Colonoscopy    . Hysteroscopy w/d&c N/A 12/18/2013    Procedure: DILATATION AND CURETTAGE /HYSTEROSCOPY;  Surgeon: Gus Height, MD;  Location: [facility];  Service: Gynecology;  Laterality: N/A;  . Robotic assisted total hysterectomy with bilateral salpingo oopherectomy N/A 02/17/2014    Procedure: ROBOTIC ASSISTED TOTAL HYSTERECTOMY WITH BILATERAL SALPINGO OOPHORECTOMY ;  Surgeon: Imagene Gurney A. Alycia Rossetti, MD;  Location: [facility];  Service: Gynecology;  Laterality: N/A;  . Lymph node dissection N/A 02/17/2014    Procedure: LYMPH NODE DISSECTION;  Surgeon: Imagene Gurney A. Alycia Rossetti, MD;  Location: [facility];  Service: Gynecology;  Laterality: N/A;    FAMILY HISTORY: family history includes Colon cancer in her maternal aunt, maternal uncle, and sister; Colon polyps in an other family  member; Crohn's disease in an other family member; Diabetes in her brother and sister; Heart disease in her brother and sister; Leukemia in her sister; Prostate cancer in her brother.  SOCIAL HISTORY:  reports that she has never smoked. She has never used smokeless tobacco. She reports  that she does not drink alcohol or use illicit drugs.  ALLERGIES: Benadryl; Ciprofloxacin; Pravastatin; Vioxx; and Cephalosporins  MEDICATIONS:  Current Outpatient Prescriptions  Medication Sig Dispense Refill  . acetaminophen (TYLENOL) 325 MG tablet Take 325-650 mg by mouth every 6 (six) hours as needed for mild pain or headache.    . polyethylene glycol (MIRALAX / GLYCOLAX) packet Take 17 g by mouth daily as needed.    . valsartan (DIOVAN) 80 MG tablet Take 80 mg by mouth every morning.     . [DISCONTINUED] esomeprazole (NEXIUM) 40 MG capsule Take 1 capsule (40 mg total) by mouth daily. 30 capsule 11  . [DISCONTINUED] Linaclotide (LINZESS) 145 MCG CAPS Take 1 capsule by mouth daily. 30 capsule 6   No current facility-administered medications for this encounter.    REVIEW OF SYSTEMS:  A 15 point review of systems is documented in the electronic medical record. This was obtained by the nursing staff. However, I reviewed this with the patient to discuss relevant findings and make appropriate changes.  She has some mild left lower extremity weakness but does not interfere with her walking. The patient denied any pain within the pelvis area prior to her surgery or pain at this time. She denies any urinary or bowel incontinence. She denies any further vaginal bleeding.   PHYSICAL EXAM:  height is 5\' 1"  (1.549 m) and weight is 140 lb (63.504 kg). Her oral temperature is 97.7 F (36.5 C). Her blood pressure is 112/65 and her pulse is 54. Her respiration is 16.   BP 112/65 mmHg  Pulse 54  Temp(Src) 97.7 F (36.5 C) (Oral)  Resp 16  Ht 5\' 1"  (1.549 m)  Wt 140 lb (63.504 kg)  BMI 26.47 kg/m2  General Appearance:    Alert, cooperative, no distress, appears stated age, accompanied by a niece on evaluation today  Head:    Normocephalic, without obvious abnormality, atraumatic  Eyes:    PERRL, conjunctiva/corneas clear, EOM's intact    Ears:    Normal TM's and external ear canals, both ears    Nose:   Nares normal, septum midline, mucosa normal, no drainage    or sinus tenderness  Throat:   Lips, mucosa, and tongue normal;  gums normal  Neck:   Supple, symmetrical, trachea midline, no adenopathy;    thyroid:  no enlargement/tenderness/nodules; no carotid   bruit or JVD  Back:     Symmetric, no curvature, ROM normal, no CVA tenderness  Lungs:     Clear to auscultation bilaterally, respirations unlabored  Chest Wall:    No tenderness or deformity   Heart:    Regular rate and rhythm, S1 and S2 normal, no murmur, rub   or gallop     Abdomen:     Soft, non-tender, bowel sounds active all four quadrants,    no masses, no organomegaly, small scars from laparoscopic procedure. No signs of infection or drainage  Genitalia:   deferred until simulation and planning day     Extremities:   Extremities normal, atraumatic, no cyanosis or edema  Pulses:   2+ and symmetric all extremities  Skin:   Skin color, texture, turgor normal, no rashes or lesions  Lymph nodes:  Cervical, supraclavicular, and axillary nodes normal, no inguinal adenopathy  Neurologic:    normal strength except for mild weakness in the proximal muscle groups of the left lower extremity, sensation and reflexes   throughout     ECOG = 1    1 - Symptomatic but completely ambulatory (Restricted in physically strenuous activity but ambulatory and able to carry out work of a light or sedentary nature. For example, light housework, office work)  LABORATORY DATA:  Lab Results  Component Value Date   WBC 8.3 02/18/2014   HGB 9.6* 02/18/2014   HCT 28.8* 02/18/2014   MCV 90.6 02/18/2014   PLT 168 02/18/2014   NEUTROABS 2.9 02/11/2014   Lab Results  Component Value Date   NA 136* 02/18/2014   K 4.3 02/18/2014   CL 103 02/18/2014   CO2 23 02/18/2014   GLUCOSE 102* 02/18/2014   CREATININE 1.12* 02/18/2014   CALCIUM 8.8 02/18/2014      RADIOGRAPHY: No results found.    IMPRESSION: stage IA grade 2 endometrioid  adenocarcinoma with 66% myometrial invasion. The patient would be at risk for vaginal cuff recurrence given her pathologic findings. I would recommend intracavitary brachytherapy treatments in this situation. Given the lack of metastatic spread,  I would not recommend external beam radiation therapy as part of her overall management. I discussed the treatment course side effects and potential toxicities of radiation therapy in this situation with the patient and her niece. The patient appears to understand and wishes to proceed with planned course of treatment.  PLAN:simulation planning and treatment in early January. Anticipate 5 intracavitary brachytherapy treatments directed at the proximal vagina. she will be treated twice a week if schedule will allow.   I spent 60 minutes minutes face to face with the patient and more than 50% of that time was spent in counseling and/or coordination of care.   ------------------------------------------------  Blair Promise, PhD, MD

## 2014-03-25 NOTE — Progress Notes (Signed)
Consult Note: Gyn-Onc  CC: Dr Harle Battiest Chief Complaint  Patient presents with  . endometrial cancer determined by uterine biopsy    Assessment/Plan:  [name]  is a 80 y.o. 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 She also has a concerning family history of colon cancer (sister, aunt and uncle) which raises the possiblity of an underlying Lynch syndrome diagnosis. We will contact pathology and ask them to perform microsatellite instability testing.   Her final pathology is a stage IA grade 2 endometrioid adenocarcinoma with 66% myometrial invasion. 0 out of 12 lymph nodes were involved and there was no lymphovascular space involvement. Per GOG 99 she would benefit from vaginal cuff brachytherapy. She saw Dr. Sondra Come today will be starting on that. She is doing much better from a postoperative perspective and has almost complete resolution of her left lower extremity weakness. She return to see me in 6 months.   HPI: [name] is a 80 year old very healthy woman with a 4-6 month history of abnormal bloody vaginal discharge. She is nulliparous and virginal. She lives alone. She was seen by her gynecologist Dr Gus Height who performed a transvaginal US on 12/11/13 which showed a 6.6x3.8x3x7cm uterus with an endometrial thickness of 37m. The ovaries were not visualized.  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  She has a strong family history of colon caner (sister, aunt and uncle). Her last colonoscopy revealed benign polyps.   She is virginal and nulliparous  Interval History:  Her CT scan reveals: EXAM: CT ABDOMEN AND PELVIS WITH CONTRAST  TECHNIQUE: Multidetector CT imaging of the abdomen and pelvis was performed using the standard protocol following bolus administration of intravenous contrast.  CONTRAST: 869mOMNIPAQUE  IOHEXOL 300 MG/ML SOLN  COMPARISON: 06/02/2013  FINDINGS: Lower chest: Clear lung bases. Mild cardiomegaly, accentuated by a pectus excavatum deformity. Right coronary artery atherosclerosis. No pericardial or pleural effusion.  Hepatobiliary: Normal liver. Cholecystectomy, without biliary ductal dilatation.  Pancreas: Normal, without mass or pancreatic ductal dilatation.  Spleen: Normal  Adrenals/Urinary Tract: Normal adrenal glands. Mild bilateral renal cortical atrophy. Bilateral multiple renal cysts and too small to characterize lesions. An interpolar left renal lesion measures fluid density centrally but demonstrates minimal increased density in its dependent portion on image 36 of series 2. 1.5 cm. No hydronephrosis. Normal urinary bladder.  Stomach/Bowel: Normal stomach, without wall thickening. A moderate size descending duodenal diverticulum. Extensive colonic diverticulosis with muscular hypertrophy involving the sigmoid. Normal terminal ileum. Otherwise normal appearance of the small bowel.  Vascular/Lymphatic: Atherosclerosis, within the aorta and at the origin of both renal arteries. No retroperitoneal or retrocrural adenopathy. No pelvic adenopathy.  Reproductive: Moderate pelvic floor laxity. Retroverted uterus. Central hypoattenuation with surrounding hyper enhancement involving the uterine fundus. This is retroverted, including on sagittal image 53. No gross extension outside of the uterus.  Other: No evidence of omental or peritoneal disease. No abdominal pelvic fluid.  Musculoskeletal: Degenerative sclerosis involving the symphysis pubis. Right hip osteoarthritis. Lumbar spondylosis.  IMPRESSION: 1. Uterine fundal hyper enhancement and central hypoattenuation, possibly representing the primary endometrial carcinoma. No gross evidence of extrauterine spread and no evidence of nodal metastasis. 2. Pelvic floor laxity. 3. Bilateral renal cortical thinning  with presumed cysts and a minimally complex interpolar left renal cyst. 4. Atherosclerosis, including within the coronary arteries.  02/17/14: Surgery: Total robotic hysterectomy bilateral salpingo-oophorectomy, bilateral pelvic and para-aortic  lymph node dissection.  Operative findings: Narrow vaginal introitus. Normal appearing uterus, cervix and adnexa. No obvious evidence of metastatic disease. Normal appearing appendix. Adhesive disease of the omentum to the anterior abdominal wall.  Diagnosis 1. Lymph node, biopsy, para-aortic, right - THREE LYMPH NODES, NEGATIVE FOR METASTATIC CARCINOMA (0/3). 2. Lymph node, biopsy, para-aortic, left - ONE LYMPH NODE, NEGATIVE FOR METASTATIC CARCINOMA (0/1). 3. Uterus +/- tubes/ovaries, neoplastic, with cervix - INVASIVE ENDOMETRIOID CARCINOMA, FIGO GRADE II, 2.6 CM INVADING INTO THE OUTER HALF OF THE MYOMETRIUM. - NO EVIDENCE OF ANGIOLYMPHATIC INVASION IDENTIFIED. - BILATERAL OVARIES: BENIGN OVARIAN TISSUE WITH ENDOSALPINGIOSIS, NO ATYPIA OR MALIGNANCY. - BILATERAL FALLOPIAN TUBES: BENIGN FALLOPIAN TUBAL TISSUE, NO EVIDENCE OF ATYPIA OR MALIGNANCY. - CERVIX: BENIGN ENDOCERVICAL MUCOSA, NO DYSPLASIA OR MALIGNANCY. 4. Lymph node, biopsy, right pelvic - SIX LYMPH NODES, NEGATIVE FOR METASTATIC CARCINOMA (0/6). 5. Lymph node, biopsy, left pelvic - TWO LYMPH NODES, NEGATIVE FOR METASTATIC CARCINOMA (0/2).  Interval History: She's doing very well since I last saw her in November 19. She is no longer seeing a physical therapist. Her leg strength is almost completely resolved. She did fall at home 6 days ago when she slipped on steroids. She did not injure herself does not believe that it was related at all to her left lower extremity weakness. She saw Dr. Sondra Come today. The biggest thing she is worried about is the need to use a vaginal dilator post radiation. She's had no bleeding. Normal bowel and bladder function. She had some questions today regarding  her platelet count. Her sister was diagnosed with leukemia and in retrospect has had a long history of low platelets. I reviewed her CBC with her today and assured her that her platelet count has been normal.  Current Meds:  Outpatient Encounter Prescriptions as of 03/25/2014  Medication Sig  . acetaminophen (TYLENOL) 325 MG tablet Take 325-650 mg by mouth every 6 (six) hours as needed for mild pain or headache.  . polyethylene glycol (MIRALAX / GLYCOLAX) packet Take 17 g by mouth daily as needed.  . valsartan (DIOVAN) 80 MG tablet Take 80 mg by mouth every morning.     Allergy:  Allergies  Allergen Reactions  . Benadryl [Diphenhydramine Hcl (Sleep)] Other (See Comments)    Light headed, heart racing.   . Ciprofloxacin Other (See Comments)    Leg pains  . Pravastatin Other (See Comments)    Leg cramps  . Vioxx [Rofecoxib] Nausea Only  . Cephalosporins Rash    Social Hx:   History   Social History  . Marital Status: Single    Spouse Name: N/A    Number of Children: 0  . Years of Education: N/A   Occupational History  . retired    Social History Main Topics  . Smoking status: Never Smoker   . Smokeless tobacco: Never Used  . Alcohol Use: No  . Drug Use: No  . Sexual Activity: No   Other Topics Concern  . Not on file   Social History Narrative    Past Surgical Hx:  Past Surgical History  Procedure Laterality Date  . Tonsillectomy and adenoidectomy  1958  . Lumbar disc surgery  1998    right L1-L2  . Cholecystectomy, laparoscopic  2002  . Rotator cuff repair  2003    bilateral  . Tendon graft  2003    with bone graft  . Skin biopsy      four times last 2005 - nose cancer  . Cataract extraction  2005    bilateral  . Cataract extraction w/ intraocular lens  implant, bilateral    . Familiar tremor    . Cholecystectomy    . Wisdom tooth extraction    . Eye surgery    . Colonoscopy    . Hysteroscopy w/d&c N/A 12/18/2013    Procedure: DILATATION AND  CURETTAGE /HYSTEROSCOPY;  Surgeon: Gus Height, MD;  Location: [facility];  Service: Gynecology;  Laterality: N/A;  . Robotic assisted total hysterectomy with bilateral salpingo oopherectomy N/A 02/17/2014    Procedure: ROBOTIC ASSISTED TOTAL HYSTERECTOMY WITH BILATERAL SALPINGO OOPHORECTOMY ;  Surgeon: Imagene Gurney A. Alycia Rossetti, MD;  Location: [facility];  Service: Gynecology;  Laterality: N/A;  . Lymph node dissection N/A 02/17/2014    Procedure: LYMPH NODE DISSECTION;  Surgeon: Imagene Gurney A. Alycia Rossetti, MD;  Location: [facility];  Service: Gynecology;  Laterality: N/A;    Past Medical Hx:  Past Medical History  Diagnosis Date  . Diverticulosis of colon (without mention of hemorrhage)   . Unspecified gastritis and gastroduodenitis without mention of hemorrhage   . Hypertension   . Hypercholesterolemia     diet controlled  . TIA (transient ischemic attack)     years ago  . Colitis, ischemic   . CAD (coronary artery disease)   . Esophageal stricture   . History of gallstones   . History of kidney stones     passed stones no surgery required  . Obesity   . Familial tremor   . Varicose veins     lower legs  . Neuromuscular disorder     bilateral hand temor -   . Arthritis     hands  . Basal cell carcinoma of nose   . Endometrial ca dx'd 12/2013    Past Gynecological History:  Virginal, nulliparous, menopause in her 83's  No LMP recorded. Patient is postmenopausal.  Family Hx:  Family History  Problem Relation Age of Onset  . Heart disease Sister   . Diabetes Brother     x 4  . Colon cancer Sister   . Crohn's disease      nephew  . Colon cancer Maternal Aunt   . Colon cancer Maternal Uncle   . Heart disease Brother   . Diabetes Sister     x 2  . Prostate cancer Brother   . Colon polyps      nephew  . Leukemia Sister      Vitals:  Blood pressure 133/77, pulse 57, temperature 97.8 F (36.6 C), temperature source Oral, resp. rate 20, height '5\' 1"'  (1.549 m), weight 139 lb (63.05 kg).  Physical  Exam:  Well-nourished well-developed female in no acute distress.  Abdomen: She has well-healed surgical incisions. Abdomen is soft and nontender.  Extremities: No edema.   Pelvic: Normal female genitalia for age. Speculum examination was poorly tolerated by patient. Unidigital pelvic examination reveals the vaginal cuff to be intact. There is no fluctuance, tenderness or nodularity. Layce Sprung A., MD   03/25/2014, 11:30 AM

## 2014-03-26 ENCOUNTER — Telehealth: Payer: Self-pay | Admitting: *Deleted

## 2014-03-26 NOTE — Telephone Encounter (Signed)
Called patient to inform of HDR Case, informed patient of all dates and times, patient states that she has had a change of mind, informed Dr. Sondra Come and waiting for his input before I cancel these appts.

## 2014-03-30 ENCOUNTER — Encounter: Payer: Self-pay | Admitting: Radiation Oncology

## 2014-03-30 NOTE — Progress Notes (Signed)
   Department of Radiation Oncology  Phone:  [phone] Fax:        [phone]  Documentation  DIAGNOSIS: Stage IA grade 2 endometrioid adenocarcinoma with 66% myometrial invasion   Narrative:          Today I spoke with the patient concerning her post-op intracavitary radiation treatment.  After careful consideration she has decided against proceeding with this treatment despite endorsement by radiation oncology and gynecologic oncology.  She understands that she will be at higher risk for recurrence in the proximal vagina without this treatment,  but is willing to accept this risk.            She does plan on proceeding with regular followup with Dr Alycia Rossetti.  -----------------------------------  Blair Promise, PhD, MD

## 2014-04-15 ENCOUNTER — Ambulatory Visit: Payer: Medicare Other | Admitting: Radiation Oncology

## 2014-04-17 ENCOUNTER — Emergency Department (HOSPITAL_COMMUNITY)
Admission: EM | Admit: 2014-04-17 | Discharge: 2014-04-18 | Disposition: A | Discharge status: Home / Self Care | Payer: Medicare Other | Attending: Emergency Medicine | Admitting: Emergency Medicine

## 2014-04-17 ENCOUNTER — Encounter (HOSPITAL_COMMUNITY): Payer: Self-pay | Admitting: Emergency Medicine

## 2014-04-17 DIAGNOSIS — Z8669 Personal history of other diseases of the nervous system and sense organs: Secondary | ICD-10-CM | POA: Insufficient documentation

## 2014-04-17 DIAGNOSIS — I8312 Varicose veins of left lower extremity with inflammation: Secondary | ICD-10-CM | POA: Insufficient documentation

## 2014-04-17 DIAGNOSIS — Z8739 Personal history of other diseases of the musculoskeletal system and connective tissue: Secondary | ICD-10-CM | POA: Insufficient documentation

## 2014-04-17 DIAGNOSIS — Z85828 Personal history of other malignant neoplasm of skin: Secondary | ICD-10-CM | POA: Insufficient documentation

## 2014-04-17 DIAGNOSIS — Z8673 Personal history of transient ischemic attack (TIA), and cerebral infarction without residual deficits: Secondary | ICD-10-CM | POA: Insufficient documentation

## 2014-04-17 DIAGNOSIS — Z87442 Personal history of urinary calculi: Secondary | ICD-10-CM | POA: Insufficient documentation

## 2014-04-17 DIAGNOSIS — Z8542 Personal history of malignant neoplasm of other parts of uterus: Secondary | ICD-10-CM | POA: Insufficient documentation

## 2014-04-17 DIAGNOSIS — Z8719 Personal history of other diseases of the digestive system: Secondary | ICD-10-CM | POA: Insufficient documentation

## 2014-04-17 DIAGNOSIS — E669 Obesity, unspecified: Secondary | ICD-10-CM | POA: Diagnosis not present

## 2014-04-17 DIAGNOSIS — I2510 Atherosclerotic heart disease of native coronary artery without angina pectoris: Secondary | ICD-10-CM | POA: Insufficient documentation

## 2014-04-17 DIAGNOSIS — I10 Essential (primary) hypertension: Secondary | ICD-10-CM | POA: Insufficient documentation

## 2014-04-17 DIAGNOSIS — M7989 Other specified soft tissue disorders: Secondary | ICD-10-CM | POA: Diagnosis present

## 2014-04-17 NOTE — ED Notes (Signed)
Pt c/o left leg pain, swelling starting 04/10/14. Has multiple tender nodule-like hard and soft areas on the lower leg. Leg is reddened and swollen. Reports pain and tenderness behind left leg. Patient is swollen compared to the right. Denies long trips or any periods of being stationary. Had total hysterectomy November 2015. Patient does not take blood thinners. No hx DVT. Has intermittent tingling in left leg but no numbness. Denies SOB. Speaking full/clear sentences. Ambulatory.

## 2014-04-18 ENCOUNTER — Ambulatory Visit (HOSPITAL_COMMUNITY)
Admission: RE | Admit: 2014-04-18 | Discharge: 2014-04-18 | Disposition: A | Discharge status: Home / Self Care | Payer: Medicare Other | Source: Ambulatory Visit | Attending: Emergency Medicine | Admitting: Emergency Medicine

## 2014-04-18 DIAGNOSIS — M7121 Synovial cyst of popliteal space [Baker], right knee: Secondary | ICD-10-CM | POA: Diagnosis not present

## 2014-04-18 DIAGNOSIS — M79606 Pain in leg, unspecified: Secondary | ICD-10-CM | POA: Diagnosis not present

## 2014-04-18 DIAGNOSIS — M79609 Pain in unspecified limb: Secondary | ICD-10-CM

## 2014-04-18 MED ORDER — CLINDAMYCIN HCL 300 MG PO CAPS
300.0000 mg | ORAL_CAPSULE | Freq: Once | ORAL | Status: AC
  Administered 2014-04-18: 300 mg via ORAL
  Filled 2014-04-18: qty 1

## 2014-04-18 MED ORDER — CLINDAMYCIN HCL 300 MG PO CAPS: 300.0000 mg | ORAL_CAPSULE | Freq: Three times a day (TID) | ORAL | Status: DC

## 2014-04-18 NOTE — Discharge Instructions (Signed)
IMPORTANT PATIENT INSTRUCTIONS:   You have been scheduled for an Outpatient Vascular Study at [facility].    If tomorrow is a Saturday or Sunday, please go to the [facility] Emergency Department registration desk at 8 AM tomorrow morning and tell them you are therefore a vascular study.  If tomorrow is a weekday (Monday - Friday), please go to the [facility] Admitting Department at 8 AM and tell them you are therefore a vascular study    Phlebitis Phlebitis is soreness and swelling (inflammation) of a vein. This can occur in your arms, legs, or torso (trunk), as well as deeper inside your body. Phlebitis is usually not serious when it occurs close to the surface of the body. However, it can cause serious problems when it occurs in a vein deeper inside the body. CAUSES  Phlebitis can be triggered by various things, including:   Reduced blood flow through your veins. This can happen with:  Bed rest over a long period.  Long-distance travel.  Injury.  Surgery.  Being overweight (obese) or pregnant.  Having an IV tube put in the vein and getting certain medicines through the vein.  Cancer and cancer treatment.  Use of illegal drugs taken through the vein.  Inflammatory diseases.  Inherited (genetic) diseases that increase the risk of blood clots.  Hormone therapy, such as birth control pills. SIGNS AND SYMPTOMS   Red, tender, swollen, and painful area on your skin. Usually, the area will be long and narrow.  Firmness along the center of the affected area. This can indicate that a blood clot has formed.  Low-grade fever. DIAGNOSIS  A health care provider can usually diagnose phlebitis by examining the affected area and asking about your symptoms. To check for infection or blood clots, your health care provider may order blood tests or an ultrasound exam of the area. Blood tests and your family history may also indicate if you have an underlying genetic disease that  causes blood clots. Occasionally, a piece of tissue is taken from the body (biopsy sample) if an unusual cause of phlebitis is suspected. TREATMENT  Treatment will vary depending on the severity of the condition and the area of the body affected. Treatment may include:  Use of a warm compress or heating pad.  Use of compression stockings or bandages.  Anti-inflammatory medicines.  Removal of any IV tube that may be causing the problem.  Medicines that kill germs (antibiotics) if an infection is present.  Blood-thinning medicines if a blood clot is suspected or present.  In rare cases, surgery may be needed to remove damaged sections of vein. HOME CARE INSTRUCTIONS   Only take over-the-counter or prescription medicines as directed by your health care provider. Take all medicines exactly as prescribed.  Raise (elevate) the affected area above the level of your heart as directed by your health care provider.  Apply a warm compress or heating pad to the affected area as directed by your health care provider. Do not sleep with the heating pad.  Use compression stockings or bandages as directed. These will speed healing and prevent the condition from coming back.  If you are on blood thinners:  Get follow-up blood tests as directed by your health care provider.  Check with your health care provider before using any new medicines.  Carry a medical alert card or wear your medical alert jewelry to show that you are on blood thinners.  For phlebitis in the legs:  Avoid prolonged standing  or bed rest.  Keep your legs moving. Raise your legs when sitting or lying.  Do not smoke.  Women, particularly those over the age of 33, should consider the risks and benefits of taking the contraceptive pill. This kind of hormone treatment can increase your risk for blood clots.  Follow up with your health care provider as directed. SEEK MEDICAL CARE IF:   You have unusual bruising or any  bleeding problems.  Your swelling or pain in the affected area is not improving.  You are on anti-inflammatory medicine, and you develop belly (abdominal) pain. SEEK IMMEDIATE MEDICAL CARE IF:   You have a sudden onset of chest pain or difficulty breathing.  You have a fever or persistent symptoms for more than 2-3 days.  You have a fever and your symptoms suddenly get worse. MAKE SURE YOU:  Understand these instructions.  Will watch your condition.  Will get help right away if you are not doing well or get worse. Document Released: 03/21/2001 Document Revised: 01/15/2013 Document Reviewed: 12/02/2012 [facility] Patient Information 2015 [place], [place]. This information is not intended to replace advice given to you by your health care provider. Make sure you discuss any questions you have with your health care provider.

## 2014-04-18 NOTE — Progress Notes (Signed)
VASCULAR LAB PRELIMINARY  PRELIMINARY  PRELIMINARY  PRELIMINARY  Left lower extremity venous Doppler completed.    Preliminary report:  There is no DVT noted in the right lower extremity.  There is thrombophlebitis noted in varicosities in the mid calf.  Small Baker's cyst noted in the popliteal fossa.   Anila Bojarski, RVT 04/18/2014, 10:10 AM

## 2014-04-19 NOTE — ED Provider Notes (Signed)
CSN: [id number]     Arrival date & time 04/17/14  2006 History   First MD Initiated Contact with Patient 04/18/14 0036     Chief Complaint  Patient presents with  . Leg Swelling  . Varicose Veins     HPI Patient presents to the emergency department complaining of swelling and redness of her left lower extremity which began on January 1.  She has a history of varicose veins reports that several of them feel slightly larger than normal.  She tried warm compresses without improvement in her symptoms.  No prior history of DVT or pulmonary embolism.  Denies weakness of her legs.  No recent injury or trauma to her left lower extremity.  Symptoms are mild to moderate in severity.  Pain is worse with palpation of her left lower extremity.  Nothing improves her pain.   Past Medical History  Diagnosis Date  . Diverticulosis of colon (without mention of hemorrhage)   . Unspecified gastritis and gastroduodenitis without mention of hemorrhage   . Hypertension   . Hypercholesterolemia     diet controlled  . TIA (transient ischemic attack)     years ago  . Colitis, ischemic   . CAD (coronary artery disease)   . Esophageal stricture   . History of gallstones   . History of kidney stones     passed stones no surgery required  . Obesity   . Familial tremor   . Varicose veins     lower legs  . Neuromuscular disorder     bilateral hand temor -   . Arthritis     hands  . Basal cell carcinoma of nose   . Endometrial ca dx'd 12/2013   Past Surgical History  Procedure Laterality Date  . Tonsillectomy and adenoidectomy  1958  . Lumbar disc surgery  1998    right L1-L2  . Cholecystectomy, laparoscopic  2002  . Rotator cuff repair  2003    bilateral  . Tendon graft  2003    with bone graft  . Skin biopsy      four times last 2005 - nose cancer  . Cataract extraction  2005    bilateral  . Cataract extraction w/ intraocular lens  implant, bilateral    . Familiar tremor    . Cholecystectomy     . Wisdom tooth extraction    . Eye surgery    . Colonoscopy    . Hysteroscopy w/d&c N/A 12/18/2013    Procedure: DILATATION AND CURETTAGE /HYSTEROSCOPY;  Surgeon: Gus Height, MD;  Location: [facility];  Service: Gynecology;  Laterality: N/A;  . Robotic assisted total hysterectomy with bilateral salpingo oopherectomy N/A 02/17/2014    Procedure: ROBOTIC ASSISTED TOTAL HYSTERECTOMY WITH BILATERAL SALPINGO OOPHORECTOMY ;  Surgeon: Imagene Gurney A. Alycia Rossetti, MD;  Location: [facility];  Service: Gynecology;  Laterality: N/A;  . Lymph node dissection N/A 02/17/2014    Procedure: LYMPH NODE DISSECTION;  Surgeon: Imagene Gurney A. Alycia Rossetti, MD;  Location: [facility];  Service: Gynecology;  Laterality: N/A;   Family History  Problem Relation Age of Onset  . Heart disease Sister   . Diabetes Brother     x 4  . Colon cancer Sister   . Crohn's disease      nephew  . Colon cancer Maternal Aunt   . Colon cancer Maternal Uncle   . Heart disease Brother   . Diabetes Sister     x 2  . Prostate cancer Brother   . Colon polyps  nephew  . Leukemia Sister    History  Substance Use Topics  . Smoking status: Never Smoker   . Smokeless tobacco: Never Used  . Alcohol Use: No   OB History    No data available     Review of Systems  All other systems reviewed and are negative.     Allergies  Benadryl; Ciprofloxacin; Pravastatin; Vioxx; and Cephalosporins  Home Medications   Prior to Admission medications   Medication Sig Start Date End Date Taking? Authorizing Provider  acetaminophen (TYLENOL) 325 MG tablet Take 325-650 mg by mouth every 6 (six) hours as needed for mild pain or headache.    Historical Provider, MD  clindamycin (CLEOCIN) 300 MG capsule Take 1 capsule (300 mg total) by mouth 3 (three) times daily. 04/18/14   Hoy Morn, MD  polyethylene glycol [facility] / Floria Raveling) packet Take 17 g by mouth daily as needed.    Historical Provider, MD  valsartan (DIOVAN) 80 MG tablet Take 80 mg by mouth every morning.      Historical Provider, MD   BP 132/77 mmHg  Pulse 64  Temp(Src) 98.1 F (36.7 C) (Oral)  Resp 18  SpO2 100% Physical Exam  Constitutional: She is oriented to person, place, and time. She appears well-developed and well-nourished.  HENT:  Head: Normocephalic.  Eyes: EOM are normal.  Neck: Normal range of motion.  Pulmonary/Chest: Effort normal.  Abdominal: She exhibits no distension.  Musculoskeletal: Normal range of motion.  Mild focus swelling with erythema over appear to be thrombosed varicose veins in her left lower extremity on the medial aspect.  Mild spreading erythema with warmth.  No focal abscess present.  Neurological: She is alert and oriented to person, place, and time.  Psychiatric: She has a normal mood and affect.  Nursing note and vitals reviewed.   ED Course  Procedures (including critical care time) Labs Review Labs Reviewed - No data to display  Imaging Review No results found.   EKG Interpretation None      MDM   Final diagnoses:  Varicose veins with inflammation, left    Suspect thrombosed varicose veins with associated phlebitis.  Patient be started on a short course of antibiotics.  Warm compresses recommended.  I also am having the patient sent for a ultrasound of her left lower extremity tomorrow to evaluate for DVT.  My suspicion however is low and therefore I did not give her a dose of Lovenox.  I feels though the risk of anticoagulation is too high given her age of 48, unless a diagnosis of DVT is made at which point the benefit would outweigh the risk.  No chest pain or shortness of breath to suggest PE.  Patient understands return to the ER for new or worsening symptoms including worsening redness.    Hoy Morn, MD 04/19/14 Shelah Lewandowsky

## 2014-04-22 ENCOUNTER — Ambulatory Visit: Payer: Medicare Other | Admitting: Radiation Oncology

## 2014-04-29 ENCOUNTER — Ambulatory Visit: Payer: Medicare Other | Admitting: Radiation Oncology

## 2014-05-06 ENCOUNTER — Encounter: Payer: Self-pay | Admitting: Radiation Oncology

## 2014-05-12 ENCOUNTER — Encounter: Payer: Self-pay | Admitting: Radiation Oncology

## 2014-05-13 ENCOUNTER — Encounter: Payer: Self-pay | Admitting: Radiation Oncology

## 2014-09-08 ENCOUNTER — Ambulatory Visit: Payer: Medicare Other | Admitting: Gynecologic Oncology

## 2014-09-09 ENCOUNTER — Telehealth: Payer: Self-pay | Admitting: *Deleted

## 2014-09-09 NOTE — Telephone Encounter (Signed)
Patient scheduled for 10/14/14 at 11am with Dr. Alycia Rossetti. Patient wrote down appt information and is agreeable to appt. Patient notified to please call our office with any questions or concerns prior to this appt.

## 2014-09-10 ENCOUNTER — Ambulatory Visit: Payer: Medicare Other | Admitting: Gynecologic Oncology

## 2014-10-14 ENCOUNTER — Ambulatory Visit: Payer: Medicare Other | Attending: Gynecologic Oncology | Admitting: Gynecologic Oncology

## 2014-10-14 ENCOUNTER — Encounter: Payer: Self-pay | Admitting: Gynecologic Oncology

## 2014-10-14 VITALS — BP 131/69 | HR 63 | Temp 98.1°F | Resp 18 | Ht 61.0 in | Wt 137.2 lb

## 2014-10-14 DIAGNOSIS — Z1504 Genetic susceptibility to malignant neoplasm of endometrium: Secondary | ICD-10-CM | POA: Insufficient documentation

## 2014-10-14 DIAGNOSIS — C541 Malignant neoplasm of endometrium: Secondary | ICD-10-CM | POA: Diagnosis not present

## 2014-10-14 NOTE — Patient Instructions (Signed)
Followup with Dr. Alycia Rossetti in January 2017. Please call our office in November to schedule this appointment or sooner with any questions or concerns.  Call Dr. Jacquiline Doe office as discussed at today's visit.

## 2014-10-14 NOTE — Progress Notes (Signed)
Consult Note: Gyn-Onc  CC: Dr Harle Battiest Chief Complaint  Patient presents with  . endometrial cancer    followup    Assessment/Plan:  Kaitlyn. [name]  is a 81 y.o. 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 She also has a concerning family history of colon cancer (sister, aunt and uncle) which raises the possiblity of an underlying Lynch syndrome diagnosis. We will contact pathology and ask them to perform microsatellite instability testing.   Her final pathology is a stage IA grade 2 endometrioid adenocarcinoma with 66% myometrial invasion. 0 out of 12 lymph nodes were involved and there was no lymphovascular space involvement. Per GOG 99 she would benefit from vaginal cuff brachytherapy. She ultimately declined radiation therapy after seeing Dr. Sondra Come.  I do not have any etiology for the moist her that she feels there is no evidence of any uterine or discharge from the vagina and she concurs that her perineum is dry on exam. We'll continue to monitor this expectantly.  With regards to the episodes of chest pain and she has had she was encouraged when it happens to getting to call 911 immediately go to her closest emergency room. She was also encouraged to call Dr. Harley Hallmark office today to schedule an appointment to be seen. Her niece is with her today we'll ensure that she does this. She was offered the opportunity follow-up alternating visits between myself and Dr. Harrington Challenger. At this point she would like to return to see me in 6 months.   HPI: [name] is a 81 year old very healthy woman with a 4-6 month history of abnormal bloody vaginal discharge. She is nulliparous and virginal. She lives alone. She was seen by her gynecologist Dr Gus Height who performed a transvaginal US on 12/11/13 which showed a 6.6x3.8x3x7cm uterus with an endometrial thickness of 41m. The ovaries were not visualized.  Dr RHarrington Challengerperformed a D&C on 12/18/13 which  revealed moderately differentiated (FIGO grade II) endometrioid endometrial adenocarcinoma.  She has a strong family history of colon caner (sister, aunt and uncle). Her last colonoscopy revealed benign polyps.   She is virginal and nulliparous  Interval History:  Her CT scan reveals: EXAM: CT ABDOMEN AND PELVIS WITH CONTRAST  TECHNIQUE: Multidetector CT imaging of the abdomen and pelvis was performed using the standard protocol following bolus administration of intravenous contrast.  CONTRAST: 834mOMNIPAQUE IOHEXOL 300 MG/ML SOLN  COMPARISON: 06/02/2013  FINDINGS: Lower chest: Clear lung bases. Mild cardiomegaly, accentuated by a pectus excavatum deformity. Right coronary artery atherosclerosis. No pericardial or pleural effusion.  Hepatobiliary: Normal liver. Cholecystectomy, without biliary ductal dilatation.  Pancreas: Normal, without mass or pancreatic ductal dilatation.  Spleen: Normal  Adrenals/Urinary Tract: Normal adrenal glands. Mild bilateral renal cortical atrophy. Bilateral multiple renal cysts and too small to characterize lesions. An interpolar left renal lesion measures fluid density centrally but demonstrates minimal increased density in its dependent portion on image 36 of series 2. 1.5 cm. No hydronephrosis. Normal urinary bladder.  Stomach/Bowel: Normal stomach, without wall thickening. A moderate size descending duodenal diverticulum. Extensive colonic diverticulosis with muscular hypertrophy involving the sigmoid. Normal terminal ileum. Otherwise normal appearance of the small bowel.  Vascular/Lymphatic: Atherosclerosis, within the aorta and at the origin of both renal arteries. No retroperitoneal or retrocrural adenopathy. No pelvic adenopathy.  Reproductive: Moderate pelvic floor laxity. Retroverted uterus. Central hypoattenuation with surrounding hyper enhancement involving the uterine fundus. This is retroverted, including on sagittal image  53.  No gross extension outside of the uterus.  Other: No evidence of omental or peritoneal disease. No abdominal pelvic fluid.  Musculoskeletal: Degenerative sclerosis involving the symphysis pubis. Right hip osteoarthritis. Lumbar spondylosis.  IMPRESSION: 1. Uterine fundal hyper enhancement and central hypoattenuation, possibly representing the primary endometrial carcinoma. No gross evidence of extrauterine spread and no evidence of nodal metastasis. 2. Pelvic floor laxity. 3. Bilateral renal cortical thinning with presumed cysts and a minimally complex interpolar left renal cyst. 4. Atherosclerosis, including within the coronary arteries.  02/17/14: Surgery: Total robotic hysterectomy bilateral salpingo-oophorectomy, bilateral pelvic and para-aortic lymph node dissection.  Operative findings: Narrow vaginal introitus. Normal appearing uterus, cervix and adnexa. No obvious evidence of metastatic disease. Normal appearing appendix. Adhesive disease of the omentum to the anterior abdominal wall.  Diagnosis 1. Lymph node, biopsy, para-aortic, right - THREE LYMPH NODES, NEGATIVE FOR METASTATIC CARCINOMA (0/3). 2. Lymph node, biopsy, para-aortic, left - ONE LYMPH NODE, NEGATIVE FOR METASTATIC CARCINOMA (0/1). 3. Uterus +/- tubes/ovaries, neoplastic, with cervix - INVASIVE ENDOMETRIOID CARCINOMA, FIGO GRADE II, 2.6 CM INVADING INTO THE OUTER HALF OF THE MYOMETRIUM. - NO EVIDENCE OF ANGIOLYMPHATIC INVASION IDENTIFIED. - BILATERAL OVARIES: BENIGN OVARIAN TISSUE WITH ENDOSALPINGIOSIS, NO ATYPIA OR MALIGNANCY. - BILATERAL FALLOPIAN TUBES: BENIGN FALLOPIAN TUBAL TISSUE, NO EVIDENCE OF ATYPIA OR MALIGNANCY. - CERVIX: BENIGN ENDOCERVICAL MUCOSA, NO DYSPLASIA OR MALIGNANCY. 4. Lymph node, biopsy, right pelvic - SIX LYMPH NODES, NEGATIVE FOR METASTATIC CARCINOMA (0/6). 5. Lymph node, biopsy, left pelvic - TWO LYMPH NODES, NEGATIVE FOR METASTATIC CARCINOMA (0/2).  Interval History: I last saw  her December 16. We did recommend vaginal cuff brachii therapy secondary to grade and depth of invasion. The patient was seen by Dr. Sondra Come on December 21. She ultimately declined adjuvant therapy. She's overall doing fairly well. She does complain of feeling "wet. There is no blood in there is no odor but she does feel some moist her on a mini pad when she wears him. Her legs are no longer week and she has not had any falls. She occasionally has some right hip pain but really feels that she's overall pain-free. She has had 2 episodes of some pain near her left breast has gone up towards her neck. She did get sweaty but there was no shortness of breath. She was at rest both times. She has a significant family history of heart disease both in her father, her mother, and HER-2 brothers. She did not seek any attention for this nor has she notified her primary care physician Dr. Joya Salm. She's not had this discomfort in a few months. She is complaining of some short-term memory changes that her brother has brought her attention to. However, she's not made any mistakes with her bills or any other household duties and she believes that is age-related and not particularly bothersome to her.  Review of Systems  Constitutional: Denies fever. Skin: No rash Cardiovascular: + chest pain with + diaphoresis, no shortness of breath, no edema  Pulmonary: No cough  Gastro Intestinal: No nausea, vomiting, constipation, or diarrhea reported. No bright red blood per rectum or change in bowel movement.  Genitourinary: No frequency, urgency, or dysuria.  Denies vaginal bleeding and discharge. + wetness Musculoskeletal: Occ right hip pain Neurologic: No weakness Psychology: Short term memory changes   Current Meds:  Outpatient Encounter Prescriptions as of 10/14/2014  Medication Sig  . acetaminophen (TYLENOL) 325 MG tablet Take 325-650 mg by mouth every 6 (six) hours as needed for mild pain or headache.  Marland Kitchen  losartan  (COZAAR) 50 MG tablet Take 50 mg by mouth daily.  . [DISCONTINUED] clindamycin (CLEOCIN) 300 MG capsule Take 1 capsule (300 mg total) by mouth 3 (three) times daily.  . [DISCONTINUED] polyethylene glycol (MIRALAX / GLYCOLAX) packet Take 17 g by mouth daily as needed.  . [DISCONTINUED] valsartan (DIOVAN) 80 MG tablet Take 80 mg by mouth every morning.    No facility-administered encounter medications on file as of 10/14/2014.    Allergy:  Allergies  Allergen Reactions  . Benadryl [Diphenhydramine Hcl (Sleep)] Other (See Comments)    Light headed, heart racing.   . Ciprofloxacin Other (See Comments)    Leg pains  . Pravastatin Other (See Comments)    Leg cramps  . Vioxx [Rofecoxib] Nausea Only  . Cephalosporins Rash    Social Hx:   History   Social History  . Marital Status: Single    Spouse Name: N/A  . Number of Children: 0  . Years of Education: N/A   Occupational History  . retired    Social History Main Topics  . Smoking status: Never Smoker   . Smokeless tobacco: Never Used  . Alcohol Use: No  . Drug Use: No  . Sexual Activity: No   Other Topics Concern  . Not on file   Social History Narrative    Past Surgical Hx:  Past Surgical History  Procedure Laterality Date  . Tonsillectomy and adenoidectomy  1958  . Lumbar disc surgery  1998    right L1-L2  . Cholecystectomy, laparoscopic  2002  . Rotator cuff repair  2003    bilateral  . Tendon graft  2003    with bone graft  . Skin biopsy      four times last 2005 - nose cancer  . Cataract extraction  2005    bilateral  . Cataract extraction w/ intraocular lens  implant, bilateral    . Familiar tremor    . Cholecystectomy    . Wisdom tooth extraction    . Eye surgery    . Colonoscopy    . Hysteroscopy w/d&c N/A 12/18/2013    Procedure: DILATATION AND CURETTAGE /HYSTEROSCOPY;  Surgeon: Gus Height, MD;  Location: [facility];  Service: Gynecology;  Laterality: N/A;  . Robotic assisted total hysterectomy with  bilateral salpingo oopherectomy N/A 02/17/2014    Procedure: ROBOTIC ASSISTED TOTAL HYSTERECTOMY WITH BILATERAL SALPINGO OOPHORECTOMY ;  Surgeon: Imagene Gurney A. Alycia Rossetti, MD;  Location: [facility];  Service: Gynecology;  Laterality: N/A;  . Lymph node dissection N/A 02/17/2014    Procedure: LYMPH NODE DISSECTION;  Surgeon: Imagene Gurney A. Alycia Rossetti, MD;  Location: [facility];  Service: Gynecology;  Laterality: N/A;    Past Medical Hx:  Past Medical History  Diagnosis Date  . Diverticulosis of colon (without mention of hemorrhage)   . Unspecified gastritis and gastroduodenitis without mention of hemorrhage   . Hypertension   . Hypercholesterolemia     diet controlled  . TIA (transient ischemic attack)     years ago  . Colitis, ischemic   . CAD (coronary artery disease)   . Esophageal stricture   . History of gallstones   . History of kidney stones     passed stones no surgery required  . Obesity   . Familial tremor   . Varicose veins     lower legs  . Neuromuscular disorder     bilateral hand temor -   . Arthritis     hands  . Basal cell carcinoma of nose   .  Endometrial ca dx'd 12/2013    Past Gynecological History:  Virginal, nulliparous, menopause in her 60's  No LMP recorded. Patient is postmenopausal.  Family Hx:  Family History  Problem Relation Age of Onset  . Heart disease Sister   . Diabetes Brother     x 4  . Colon cancer Sister   . Crohn's disease      nephew  . Colon cancer Maternal Aunt   . Colon cancer Maternal Uncle   . Heart disease Brother   . Diabetes Sister     x 2  . Prostate cancer Brother   . Colon polyps      nephew  . Leukemia Sister      Vitals:  Blood pressure 131/69, pulse 63, temperature 98.1 F (36.7 C), temperature source Oral, resp. rate 18, height '5\' 1"'  (1.549 m), weight 137 lb 3.2 oz (62.234 kg), SpO2 100 %.  Physical Exam:  Well-nourished well-developed female in no acute distress.    Neck: Supple, no lymphadenopathy, no thyromegaly.  Lungs:  Clear to auscultation bilateral.  Cardiovascular: Regular rate and rhythm  Abdomen: She has well-healed surgical incisions. Abdomen is soft and nontender.  Extremities: No edema.   Pelvic: Normal female genitalia for age. The patient is complaining of whiteness the perineum was completely dry. The patient was shown her perineum with the mirror and admits that it is dry with no moisture noted. Speculum examination was poorly tolerated by patient. Unidigital pelvic examination reveals the vaginal cuff to have no lesions. There is no fluctuance, tenderness or nodularity. Rectal confirms Franklin Clapsaddle A., MD   10/14/2014, 11:34 AM

## 2014-10-16 ENCOUNTER — Emergency Department (HOSPITAL_COMMUNITY): Payer: Medicare Other

## 2014-10-16 ENCOUNTER — Observation Stay (HOSPITAL_COMMUNITY): Payer: Medicare Other

## 2014-10-16 ENCOUNTER — Encounter (HOSPITAL_COMMUNITY): Payer: Self-pay | Admitting: Emergency Medicine

## 2014-10-16 ENCOUNTER — Other Ambulatory Visit: Payer: Self-pay

## 2014-10-16 ENCOUNTER — Observation Stay (HOSPITAL_COMMUNITY)
Admission: EM | Admit: 2014-10-16 | Discharge: 2014-10-18 | Disposition: A | Discharge status: Home / Self Care | Payer: Medicare Other | Attending: Internal Medicine | Admitting: Internal Medicine

## 2014-10-16 DIAGNOSIS — K5909 Other constipation: Secondary | ICD-10-CM | POA: Diagnosis present

## 2014-10-16 DIAGNOSIS — E780 Pure hypercholesterolemia: Secondary | ICD-10-CM | POA: Diagnosis not present

## 2014-10-16 DIAGNOSIS — K2990 Gastroduodenitis, unspecified, without bleeding: Secondary | ICD-10-CM | POA: Diagnosis not present

## 2014-10-16 DIAGNOSIS — Y998 Other external cause status: Secondary | ICD-10-CM | POA: Diagnosis not present

## 2014-10-16 DIAGNOSIS — N179 Acute kidney failure, unspecified: Secondary | ICD-10-CM | POA: Diagnosis not present

## 2014-10-16 DIAGNOSIS — R55 Syncope and collapse: Principal | ICD-10-CM | POA: Diagnosis present

## 2014-10-16 DIAGNOSIS — Z8673 Personal history of transient ischemic attack (TIA), and cerebral infarction without residual deficits: Secondary | ICD-10-CM | POA: Insufficient documentation

## 2014-10-16 DIAGNOSIS — Y9289 Other specified places as the place of occurrence of the external cause: Secondary | ICD-10-CM | POA: Diagnosis not present

## 2014-10-16 DIAGNOSIS — S0181XA Laceration without foreign body of other part of head, initial encounter: Secondary | ICD-10-CM | POA: Insufficient documentation

## 2014-10-16 DIAGNOSIS — Z8542 Personal history of malignant neoplasm of other parts of uterus: Secondary | ICD-10-CM | POA: Diagnosis not present

## 2014-10-16 DIAGNOSIS — K5730 Diverticulosis of large intestine without perforation or abscess without bleeding: Secondary | ICD-10-CM | POA: Insufficient documentation

## 2014-10-16 DIAGNOSIS — G250 Essential tremor: Secondary | ICD-10-CM | POA: Diagnosis not present

## 2014-10-16 DIAGNOSIS — Y9389 Activity, other specified: Secondary | ICD-10-CM | POA: Insufficient documentation

## 2014-10-16 DIAGNOSIS — K222 Esophageal obstruction: Secondary | ICD-10-CM | POA: Insufficient documentation

## 2014-10-16 DIAGNOSIS — R002 Palpitations: Secondary | ICD-10-CM

## 2014-10-16 DIAGNOSIS — R001 Bradycardia, unspecified: Secondary | ICD-10-CM | POA: Diagnosis not present

## 2014-10-16 DIAGNOSIS — K559 Vascular disorder of intestine, unspecified: Secondary | ICD-10-CM | POA: Diagnosis not present

## 2014-10-16 DIAGNOSIS — I2510 Atherosclerotic heart disease of native coronary artery without angina pectoris: Secondary | ICD-10-CM | POA: Diagnosis not present

## 2014-10-16 DIAGNOSIS — E669 Obesity, unspecified: Secondary | ICD-10-CM | POA: Insufficient documentation

## 2014-10-16 DIAGNOSIS — N289 Disorder of kidney and ureter, unspecified: Secondary | ICD-10-CM

## 2014-10-16 DIAGNOSIS — M7120 Synovial cyst of popliteal space [Baker], unspecified knee: Secondary | ICD-10-CM | POA: Insufficient documentation

## 2014-10-16 DIAGNOSIS — Z85828 Personal history of other malignant neoplasm of skin: Secondary | ICD-10-CM | POA: Insufficient documentation

## 2014-10-16 DIAGNOSIS — I10 Essential (primary) hypertension: Secondary | ICD-10-CM | POA: Insufficient documentation

## 2014-10-16 DIAGNOSIS — R519 Headache, unspecified: Secondary | ICD-10-CM

## 2014-10-16 DIAGNOSIS — Z79899 Other long term (current) drug therapy: Secondary | ICD-10-CM | POA: Insufficient documentation

## 2014-10-16 DIAGNOSIS — M1990 Unspecified osteoarthritis, unspecified site: Secondary | ICD-10-CM | POA: Insufficient documentation

## 2014-10-16 DIAGNOSIS — R748 Abnormal levels of other serum enzymes: Secondary | ICD-10-CM | POA: Diagnosis not present

## 2014-10-16 DIAGNOSIS — W1839XA Other fall on same level, initial encounter: Secondary | ICD-10-CM | POA: Insufficient documentation

## 2014-10-16 DIAGNOSIS — R51 Headache: Secondary | ICD-10-CM

## 2014-10-16 DIAGNOSIS — I868 Varicose veins of other specified sites: Secondary | ICD-10-CM | POA: Diagnosis not present

## 2014-10-16 HISTORY — DX: Malignant neoplasm of endometrium: C54.1

## 2014-10-16 HISTORY — DX: Diverticulosis of large intestine without perforation or abscess without bleeding: K57.30

## 2014-10-16 HISTORY — DX: Essential (primary) hypertension: I10

## 2014-10-16 HISTORY — DX: Gastroduodenitis, unspecified, without bleeding: K29.90

## 2014-10-16 HISTORY — DX: Synovial cyst of popliteal space (Baker), unspecified knee: M71.20

## 2014-10-16 LAB — BASIC METABOLIC PANEL
Anion gap: 6 (ref 5–15)
BUN: 30 mg/dL — AB (ref 6–20)
CHLORIDE: 110 mmol/L (ref 101–111)
CO2: 23 mmol/L (ref 22–32)
CREATININE: 1.71 mg/dL — AB (ref 0.44–1.00)
Calcium: 9.3 mg/dL (ref 8.9–10.3)
GFR calc Af Amer: 32 mL/min — ABNORMAL LOW (ref >60)
GFR calc non Af Amer: 27 mL/min — ABNORMAL LOW (ref >60)
Glucose, Bld: 97 mg/dL (ref 65–99)
Potassium: 4.4 mmol/L (ref 3.5–5.1)
Sodium: 139 mmol/L (ref 135–145)

## 2014-10-16 LAB — CBC WITH DIFFERENTIAL/PLATELET
BASOS ABS: 0 10*3/uL (ref 0.0–0.1)
BASOS PCT: 0 % (ref 0–1)
Eosinophils Absolute: 0.1 10*3/uL (ref 0.0–0.7)
Eosinophils Relative: 1 % (ref 0–5)
HEMATOCRIT: 33.2 % — AB (ref 36.0–46.0)
HEMOGLOBIN: 11.2 g/dL — AB (ref 12.0–15.0)
Lymphocytes Relative: 18 % (ref 12–46)
Lymphs Abs: 1.2 10*3/uL (ref 0.7–4.0)
MCH: 31.5 pg (ref 26.0–34.0)
MCHC: 33.7 g/dL (ref 30.0–36.0)
MCV: 93.5 fL (ref 78.0–100.0)
Monocytes Absolute: 0.5 10*3/uL (ref 0.1–1.0)
Monocytes Relative: 8 % (ref 3–12)
NEUTROS ABS: 4.9 10*3/uL (ref 1.7–7.7)
Neutrophils Relative %: 73 % (ref 43–77)
Platelets: 181 10*3/uL (ref 150–400)
RBC: 3.55 MIL/uL — ABNORMAL LOW (ref 3.87–5.11)
RDW: 13.3 % (ref 11.5–15.5)
WBC: 6.7 10*3/uL (ref 4.0–10.5)

## 2014-10-16 LAB — CBC
HEMATOCRIT: 32.5 % — AB (ref 36.0–46.0)
HEMOGLOBIN: 10.7 g/dL — AB (ref 12.0–15.0)
MCH: 30.7 pg (ref 26.0–34.0)
MCHC: 32.9 g/dL (ref 30.0–36.0)
MCV: 93.4 fL (ref 78.0–100.0)
Platelets: 183 10*3/uL (ref 150–400)
RBC: 3.48 MIL/uL — AB (ref 3.87–5.11)
RDW: 13.6 % (ref 11.5–15.5)
WBC: 8 10*3/uL (ref 4.0–10.5)

## 2014-10-16 LAB — CREATININE, SERUM
CREATININE: 1.55 mg/dL — AB (ref 0.44–1.00)
GFR calc non Af Amer: 31 mL/min — ABNORMAL LOW (ref >60)
GFR, EST AFRICAN AMERICAN: 36 mL/min — AB (ref >60)

## 2014-10-16 LAB — TSH: TSH: 0.646 u[IU]/mL (ref 0.350–4.500)

## 2014-10-16 LAB — TROPONIN I

## 2014-10-16 MED ORDER — ENOXAPARIN SODIUM 30 MG/0.3ML ~~LOC~~ SOLN
30.0000 mg | SUBCUTANEOUS | Status: DC
  Administered 2014-10-16 – 2014-10-17 (×2): 30 mg via SUBCUTANEOUS
  Filled 2014-10-16 (×3): qty 0.3

## 2014-10-16 MED ORDER — SODIUM CHLORIDE 0.9 % IV BOLUS (SEPSIS)
1000.0000 mL | Freq: Once | INTRAVENOUS | Status: AC
  Administered 2014-10-16: 1000 mL via INTRAVENOUS

## 2014-10-16 MED ORDER — SODIUM CHLORIDE 0.9 % IV SOLN
INTRAVENOUS | Status: DC
  Administered 2014-10-16: 21:00:00 via INTRAVENOUS

## 2014-10-16 MED ORDER — SODIUM CHLORIDE 0.9 % IV SOLN: INTRAVENOUS | Status: DC

## 2014-10-16 MED ORDER — ACETAMINOPHEN 325 MG PO TABS
325.0000 mg | ORAL_TABLET | Freq: Four times a day (QID) | ORAL | Status: DC | PRN
  Administered 2014-10-16 – 2014-10-17 (×2): 650 mg via ORAL
  Filled 2014-10-16 (×2): qty 2

## 2014-10-16 MED ORDER — LOSARTAN POTASSIUM 50 MG PO TABS
50.0000 mg | ORAL_TABLET | Freq: Every day | ORAL | Status: DC
  Filled 2014-10-16: qty 1

## 2014-10-16 NOTE — ED Provider Notes (Signed)
CSN: [id number]     Arrival date & time 10/16/14  1432 History   First MD Initiated Contact with Patient 10/16/14 1435     Chief Complaint  Patient presents with  . Fall    questional mechanism, does not remember fall but has hx of short term memory loss  . Loss of Consciousness     (Consider location/radiation/quality/duration/timing/severity/associated sxs/prior Treatment) HPI Comments: 81 year old female with history of diverticulosis, reflux, constipation, endometrial cancer with hysterectomy no current chemotherapy or active cancer treatment presents after syncopal event head injury. Patient will follow this morning and was seated and then blasting she remembers she is going to stand and had sudden onset of syncope. Patient has left facial injury mild bleeding controlled with small laceration. Patient has tenderness with range of motion and palpation of the right knee. Other than that patient currently feels well. No history of syncope. No significant cardiac history known. No known valve problems.  Patient is a 81 y.o. female presenting with fall and syncope. The history is provided by the patient.  Fall Associated symptoms include headaches. Pertinent negatives include no chest pain, no abdominal pain and no shortness of breath.  Loss of Consciousness Associated symptoms: headaches   Associated symptoms: no chest pain, no fever, no shortness of breath and no vomiting     Past Medical History  Diagnosis Date  . Diverticulosis of colon (without mention of hemorrhage)   . Unspecified gastritis and gastroduodenitis without mention of hemorrhage   . Hypertension   . Hypercholesterolemia     diet controlled  . TIA (transient ischemic attack)     years ago  . Colitis, ischemic   . CAD (coronary artery disease)   . Esophageal stricture   . History of gallstones   . History of kidney stones     passed stones no surgery required  . Obesity   . Familial tremor   . Varicose veins     lower legs  . Neuromuscular disorder     bilateral hand temor -   . Arthritis     hands  . Basal cell carcinoma of nose   . Endometrial ca dx'd 12/2013   Past Surgical History  Procedure Laterality Date  . Tonsillectomy and adenoidectomy  1958  . Lumbar disc surgery  1998    right L1-L2  . Cholecystectomy, laparoscopic  2002  . Rotator cuff repair  2003    bilateral  . Tendon graft  2003    with bone graft  . Skin biopsy      four times last 2005 - nose cancer  . Cataract extraction  2005    bilateral  . Cataract extraction w/ intraocular lens  implant, bilateral    . Familiar tremor    . Cholecystectomy    . Wisdom tooth extraction    . Eye surgery    . Colonoscopy    . Hysteroscopy w/d&c N/A 12/18/2013    Procedure: DILATATION AND CURETTAGE /HYSTEROSCOPY;  Surgeon: Gus Height, MD;  Location: [facility];  Service: Gynecology;  Laterality: N/A;  . Robotic assisted total hysterectomy with bilateral salpingo oopherectomy N/A 02/17/2014    Procedure: ROBOTIC ASSISTED TOTAL HYSTERECTOMY WITH BILATERAL SALPINGO OOPHORECTOMY ;  Surgeon: Imagene Gurney A. Alycia Rossetti, MD;  Location: [facility];  Service: Gynecology;  Laterality: N/A;  . Lymph node dissection N/A 02/17/2014    Procedure: LYMPH NODE DISSECTION;  Surgeon: Imagene Gurney A. Alycia Rossetti, MD;  Location: [facility];  Service: Gynecology;  Laterality: N/A;   Family History  Problem Relation Age of Onset  . Heart disease Sister   . Diabetes Brother     x 4  . Colon cancer Sister   . Crohn's disease      nephew  . Colon cancer Maternal Aunt   . Colon cancer Maternal Uncle   . Heart disease Brother   . Diabetes Sister     x 2  . Prostate cancer Brother   . Colon polyps      nephew  . Leukemia Sister    History  Substance Use Topics  . Smoking status: Never Smoker   . Smokeless tobacco: Never Used  . Alcohol Use: No   OB History    No data available     Review of Systems  Constitutional: Negative for fever and chills.  HENT: Negative for  congestion.   Eyes: Negative for visual disturbance.  Respiratory: Negative for shortness of breath.   Cardiovascular: Positive for syncope. Negative for chest pain.  Gastrointestinal: Negative for vomiting and abdominal pain.  Genitourinary: Negative for dysuria and flank pain.  Musculoskeletal: Positive for arthralgias. Negative for back pain, neck pain and neck stiffness.  Skin: Positive for wound. Negative for rash.  Neurological: Positive for syncope and headaches. Negative for light-headedness.      Allergies  Benadryl; Ciprofloxacin; Pravastatin; Vioxx; and Cephalosporins  Home Medications   Prior to Admission medications   Medication Sig Start Date End Date Taking? Authorizing Provider  acetaminophen (TYLENOL) 325 MG tablet Take 325-650 mg by mouth every 6 (six) hours as needed for mild pain or headache.   Yes Historical Provider, MD  losartan (COZAAR) 50 MG tablet Take 50 mg by mouth daily. 10/05/14  Yes Historical Provider, MD   BP 129/95 mmHg  Pulse 56  Temp(Src) 98.2 F (36.8 C) (Oral)  Resp 17  Ht 5\' 1"  (1.549 m)  Wt 131 lb 3.2 oz (59.512 kg)  BMI 24.80 kg/m2  SpO2 98% Physical Exam  Constitutional: She is oriented to person, place, and time. She appears well-developed and well-nourished.  HENT:  Head: Normocephalic.  Patient has tenderness and mild swelling left lateral orbital area, 1 cm superficial laceration bleeding controlled.no step off, nose midline  Eyes: Conjunctivae are normal. Right eye exhibits no discharge. Left eye exhibits no discharge.  Neck: Normal range of motion. Neck supple. No tracheal deviation present.  Cardiovascular: Regular rhythm.  Bradycardia present.   No murmur heard. Pulmonary/Chest: Effort normal and breath sounds normal.  Abdominal: Soft. She exhibits no distension. There is no tenderness. There is no guarding.  Musculoskeletal: She exhibits no edema.  Patient has tenderness and mild swelling anterior right knee, full range of  motion however tenderness with flexion. Patient is no tenderness with rotation of bilateral hips and shoulders or elbows.  Neurological: She is alert and oriented to person, place, and time. No cranial nerve deficit.  5+ strength in UE and LE with f/e at major joints. Sensation to palpation intact in UE and LE. CNs 2-12 grossly intact.  EOMFI.  PERRL.   Finger nose and coordination intact bilateral.   Visual fields intact to finger testing. Tremor left arm  Skin: Skin is warm. No rash noted.  Psychiatric: She has a normal mood and affect.  Nursing note and vitals reviewed.   ED Course  Procedures (including critical care time) LACERATION REPAIR Performed by: Mariea Clonts Authorized by: Mariea Clonts Consent: Verbal consent obtained. Risks and benefits: risks, benefits and alternatives were discussed Consent given by: patient  Patient identity confirmed: provided demographic data Prepped and Draped in normal sterile fashion Wound explored  Laceration Location: 1 cm  left face No Foreign Bodies seen or palpated  Amount of cleaning: standard  Technique: dermabond  Patient tolerance: Patient tolerated the procedure well with no immediate complications.   Labs Review Labs Reviewed  CBC WITH DIFFERENTIAL/PLATELET - Abnormal; Notable for the following:    RBC 3.55 (*)    Hemoglobin 11.2 (*)    HCT 33.2 (*)    All other components within normal limits  BASIC METABOLIC PANEL - Abnormal; Notable for the following:    BUN 30 (*)    Creatinine, Ser 1.71 (*)    GFR calc non Af Amer 27 (*)    GFR calc Af Amer 32 (*)    All other components within normal limits  TROPONIN I    Imaging Review Ct Head Wo Contrast  10/16/2014   CLINICAL DATA:  Pain following fall hitting head  EXAM: CT HEAD WITHOUT CONTRAST  TECHNIQUE: Contiguous axial images were obtained from the base of the skull through the vertex without intravenous contrast.  COMPARISON:  November 07, 2013  FINDINGS: There  is stable age related volume loss. There is no intracranial mass, hemorrhage, extra-axial fluid collection, or midline shift. Gray-white compartments are normal. No acute infarct evident. The bony calvarium appears intact. The mastoid air cells are clear.  IMPRESSION: Study within normal limits for age. No intracranial mass, hemorrhage, or extra-axial fluid collection. The gray-white compartments are within normal limits.   Electronically Signed   By: Lowella Grip III M.D.   On: 10/16/2014 15:35   Dg Knee Complete 4 Views Right  10/16/2014   CLINICAL DATA:  81 year old female with acute right knee injury and pain. Initial encounter.  EXAM: RIGHT KNEE - COMPLETE 4+ VIEW  COMPARISON:  None.  FINDINGS: There is no evidence of acute fracture, subluxation or dislocation.  There is no evidence of joint effusion.  Tricompartmental degenerative changes are identified, severe in the lateral compartment and moderate in the medial and patellofemoral compartments. Chondrocalcinosis is identified. No suspicious focal bony lesions are present.  IMPRESSION: No evidence of acute abnormality.  Tricompartmental degenerative changes, severe in the lateral compartment.   Electronically Signed   By: Margarette Canada M.D.   On: 10/16/2014 15:25     EKG Interpretation None     EKG reviewed by myself heart rate 60, sinus, normal QT, no ischemic changes or findings MDM   Final diagnoses:  Syncope and collapse  Facial laceration, initial encounter  Acute renal failure, unspecified acute renal failure type   Patient presents after syncopal episode causing facial injury. CT head results reviewed no acute findings no bleeding. X-ray reviewed arthritis no fracture. Blood work pending.  Patient well-appearing on reassessment. ORal and IV fluids ordered. Discussed the case with Dr. Wynonia Lawman recommended medicine observation/admission for further evaluation and they can consult him if they feel necessary.  The patients results  and plan were reviewed and discussed.   Any x-rays performed were independently reviewed by myself.   Differential diagnosis were considered with the presenting HPI.  Medications  sodium chloride 0.9 % bolus 1,000 mL (not administered)    Filed Vitals:   10/16/14 1437 10/16/14 1445 10/16/14 1500  BP: 138/64 127/82 129/95  Pulse: 60 64 56  Temp: 98.2 F (36.8 C)    TempSrc: Oral    Resp: 18 26 17   Height: 5\' 1"  (1.549 m)    Weight: 131  lb 3.2 oz (59.512 kg)    SpO2: 98% 99% 98%    Final diagnoses:  Syncope and collapse  Facial laceration, initial encounter  Acute renal failure, unspecified acute renal failure type    Admission/ observation were discussed with the admitting physician, patient and/or family and they are comfortable with the plan.    Medications  sodium chloride 0.9 % bolus 1,000 mL (not administered)    Filed Vitals:   10/16/14 1437 10/16/14 1445 10/16/14 1500  BP: 138/64 127/82 129/95  Pulse: 60 64 56  Temp: 98.2 F (36.8 C)    TempSrc: Oral    Resp: 18 26 17   Height: 5\' 1"  (1.549 m)    Weight: 131 lb 3.2 oz (59.512 kg)    SpO2: 98% 99% 98%    Final diagnoses:  Syncope and collapse  Facial laceration, initial encounter  Acute renal failure, unspecified acute renal failure type       Elnora Morrison, MD 10/16/14 [phone]

## 2014-10-16 NOTE — Consult Note (Signed)
Primary cardiologist: Dr. Ezzard Standing Consulting cardiologist: Dr. Satira Sark  Reason for consultation: Syncope  Clinical Summary [name] is a 81 y.o.female with past medical history outlined below, currently admitted to the hospital after an episode of apparent syncope. She states that she went out for breakfast with her sister this morning, felt well, went back home to sit on her back porch and had some coffee. The next thing she remembers was kneeling on the floor trying to open the door for her brother. She had apparently called him and told him that she had fallen on her knees and could not get up, but does not actually recall the conversation. She does not recall having any antecedent chest pain, shortness of breath, lightheadedness, or palpitations. She thinks that perhaps she stood up and then fell but does not remember. She reports no prior history of syncope.  She lives alone, states that she walks up an back in her "long driveway" each day 7 times without any limitations and generally feels well. She recalls having had an episode of palpitations that occurred at night time a few evenings ago, took a few deep, forceful breaths and the symptoms resolved. She had no dizziness at that time. Cardiac history is unclear. She states that she has followed with Dr. Wynonia Lawman, but has not seen him for several years. She has had prior CT imaging showing evidence of coronary atherosclerosis, but does not recall ever having a cardiac catheterization, myocardial infarction, or percutaneous coronary intervention. She is also not aware of any prior arrhythmias although does remember being told that she had "bradycardia."  She reports having a headache for the last few days. Now with right knee pain, presumably after her fall, no fracture by x-ray. Head CT was negative for acute event. Initial troponin I is negative. ECG shows sinus rhythm with borderline low voltage and decreased R wave  progression.   Allergies  Allergen Reactions  . Benadryl [Diphenhydramine Hcl (Sleep)] Other (See Comments)    Light headed, heart racing.   . Ciprofloxacin Other (See Comments)    Leg pains  . Pravastatin Other (See Comments)    Leg cramps  . Vioxx [Rofecoxib] Nausea Only  . Cephalosporins Rash    Medications Scheduled Medications: . enoxaparin (LOVENOX) injection  30 mg Subcutaneous Q24H  . [START ON 10/17/2014] losartan  50 mg Oral Daily    Infusions: . sodium chloride      PRN Medications: acetaminophen   Past Medical History  Diagnosis Date  . Diverticulosis of colon   . Gastritis and duodenitis   . Essential hypertension   . Hypercholesterolemia   . TIA (transient ischemic attack)   . Colitis, ischemic   . CAD (coronary artery disease)     Based on CT imaging 12/2013  . Esophageal stricture   . History of gallstones   . History of kidney stones   . Obesity   . Familial tremor   . Varicose veins   . Arthritis   . Basal cell carcinoma of nose   . Endometrial cancer 12/2013  . Baker cyst     a. 04/2014 on right.    Past Surgical History  Procedure Laterality Date  . Tonsillectomy and adenoidectomy  1958  . Lumbar disc surgery Right 1998    L1-L2  . Cholecystectomy, laparoscopic  2002  . Rotator cuff repair Bilateral 2003  . Tendon graft  2003    Bone graft  . Skin biopsy  Four times last 2005 - nose cancer  . Cataract extraction Bilateral 2005  . Cataract extraction w/ intraocular lens  implant, bilateral    . Cholecystectomy    . Wisdom tooth extraction    . Eye surgery    . Colonoscopy    . Hysteroscopy w/d&c N/A 12/18/2013    Procedure: DILATATION AND CURETTAGE /HYSTEROSCOPY;  Surgeon: Gus Height, MD;  Location: [facility];  Service: Gynecology;  Laterality: N/A;  . Robotic assisted total hysterectomy with bilateral salpingo oopherectomy N/A 02/17/2014    Procedure: ROBOTIC ASSISTED TOTAL HYSTERECTOMY WITH BILATERAL SALPINGO OOPHORECTOMY ;   Surgeon: Imagene Gurney A. Alycia Rossetti, MD;  Location: [facility];  Service: Gynecology;  Laterality: N/A;  . Lymph node dissection N/A 02/17/2014    Procedure: LYMPH NODE DISSECTION;  Surgeon: Imagene Gurney A. Alycia Rossetti, MD;  Location: [facility];  Service: Gynecology;  Laterality: N/A;    Family History  Problem Relation Age of Onset  . Heart disease Sister   . Diabetes Brother     x 4  . Colon cancer Sister   . Crohn's disease      Nephew  . Colon cancer Maternal Aunt   . Colon cancer Maternal Uncle   . Heart disease Brother   . Diabetes Sister     x 2  . Prostate cancer Brother   . Colon polyps      Nephew  . Leukemia Sister     Social History [name] reports that she has never smoked. She has never used smokeless tobacco. [name] reports that she does not drink alcohol.  Review of Systems Complete review of systems negative except as otherwise outlined in the clinical summary and also the following. She reports having trouble with short-term memory loss, nothing recently progressive. No focal motor weakness. No claudication.  Physical Examination Blood pressure 128/48, pulse 52, temperature 97.7 F (36.5 C), temperature source Oral, resp. rate 16, height 5\' 1"  (1.549 m), weight 141 lb 1.5 oz (64 kg), SpO2 100 %. No intake or output data in the 24 hours ending 10/16/14 1932  Telemetry: Sinus rhythm at this point.  Gen.: Elderly woman in no distress. HEENT: Conjunctiva normal, laceration left forehead with ecchymosis, oropharynx clear. Neck: Supple, no elevated JVP or carotid bruits, no thyromegaly. Lungs: Clear to auscultation, nonlabored breathing at rest. Cardiac: Regular rate and rhythm, no S3 or significant systolic murmur, no pericardial rub. Abdomen: Soft, nontender,bowel sounds present, no guarding or rebound. Extremities: No pitting edema, distal pulses 2+. Skin: Warm and dry. Small abrasion on right anterior knee. Musculoskeletal: Mild kyphosis. Neuropsychiatric: Alert and oriented x3,  affect grossly appropriate.  Lab Results  Basic Metabolic Panel:  Recent Labs Lab 10/16/14 1541  NA 139  K 4.4  CL 110  CO2 23  GLUCOSE 97  BUN 30*  CREATININE 1.71*  CALCIUM 9.3    CBC:  Recent Labs Lab 10/16/14 1541  WBC 6.7  NEUTROABS 4.9  HGB 11.2*  HCT 33.2*  MCV 93.5  PLT 181    Cardiac Enzymes:  Recent Labs Lab 10/16/14 1541  TROPONINI <0.03    Imaging Head CT 10/16/2014: FINDINGS: There is stable age related volume loss. There is no intracranial mass, hemorrhage, extra-axial fluid collection, or midline shift. Gray-white compartments are normal. No acute infarct evident. The bony calvarium appears intact. The mastoid air cells are clear.  IMPRESSION: Study within normal limits for age. No intracranial mass, hemorrhage, or extra-axial fluid collection. The gray-white compartments are within normal limits.  Right knee  films 10/16/2014: FINDINGS: There is no evidence of acute fracture, subluxation or dislocation.  There is no evidence of joint effusion.  Tricompartmental degenerative changes are identified, severe in the lateral compartment and moderate in the medial and patellofemoral compartments. Chondrocalcinosis is identified. No suspicious focal bony lesions are present.  IMPRESSION: No evidence of acute abnormality.  Tricompartmental degenerative changes, severe in the lateral compartment.  Impression  1. Episode of apparent syncope, unwitnessed, details of event not clear. It does sound as if the patient fell and lost consciousness without obvious precipitant other than perhaps occurring after standing. Initial cardiac markers argue against ACS, ECG is nonacute, she does not endorse any active chest pain or palpitations prior to the event. She did have some sense of palpitations a few evenings ago that were not related to any other symptoms. Other findings at this point include mild anemia and also renal insufficiency with creatinine  1.7. She recalls being told in the past about having "bradycardia." Also need to exclude an arrhythmogenic cause.  2. Acute renal insufficiency, creatinine 1.7, up from 1.1 in November 2015. She is on losartan as an outpatient.  3. Reported history of coronary artery disease, details not clear. May have been based on CT imaging, evident in report from September 2015. She does not recall any prior history of myocardial infarction or cardiac catheterization. No recent chest discomfort and initial cardiac markers are normal.  4. Essential hypertension. Systolic has been essentially normal, somewhat wide pulse pressure. Not frankly orthostatic in the ER.  5. History of TIA, head CT imaging shows no acute findings now. Head MRI pending.  Recommendations  Would continue telemetry monitoring and arrange an echocardiogram to assess cardiac structure and function. Agree with holding losartan for now, gently hydrate. At this point there is no clear cardiac etiology for her event, although would be suspicious about a transient arrhythmia. Cardiology to follow with you.  Satira Sark, M.D., F.A.C.C.

## 2014-10-16 NOTE — ED Notes (Signed)
Patient transported to CT/xray 

## 2014-10-16 NOTE — H&P (Signed)
History and Physical  [name] [id number] DOB: 26-Feb-1933 DOA: 10/16/2014  Referring physician: EDP PCP: [name], MD   Chief Complaint:  syncope  HPI: [name] is a 81 y.o. female  81 year old female with history of diverticulosis, reflux, constipation, endometrial cancer with hysterectomy no current chemotherapy or active cancer treatment presents after syncopal event head injury. Patient will follow this morning and was seated and then blasting she remembers she is going to stand and had sudden onset of syncope. Patient has left facial injury mild bleeding controlled with small laceration. Patient has tenderness with range of motion and palpation of the right knee. Other than that patient currently feels well. No history of syncope. No significant cardiac history known. No known valve problems.  ED course: mild orthostasis, mild elevation of cr. Ekg/ct head/troponin unremarkable. dermabond applied to left Forehead laceration. EDP discussed case with cardiology Dr. Wynonia Lawman, he recommend admit to hospitalist service under observation status, consider formal cardiology consult if hospitalist team think this is needed.  Upon entering patient's room, patient in no apoparent distress, she reported that she is very active at baseline, recently she has been trying to loose weight by doing more exercise and eating less. She also noticed intermittent palpitation for the last months, palpitation will stop when she take deep breath or hold her breath for a few seconds.she denies chest pain, no sob, does report frontal headache which has been intermittent and ongoing since she fell three years ago. Denies vision changes, no tinnitus, no dizziness, no edema, no fever.  Review of Systems:  Detail per HPI, Review of systems are otherwise negative  Past Medical History  Diagnosis Date  . Diverticulosis of colon   . Gastritis and duodenitis   . Essential hypertension   .  Hypercholesterolemia   . TIA (transient ischemic attack)   . Colitis, ischemic   . CAD (coronary artery disease)   . Esophageal stricture   . History of gallstones   . History of kidney stones   . Obesity   . Familial tremor   . Varicose veins   . Arthritis   . Basal cell carcinoma of nose   . Endometrial cancer 12/2013   Past Surgical History  Procedure Laterality Date  . Tonsillectomy and adenoidectomy  1958  . Lumbar disc surgery Right 1998    L1-L2  . Cholecystectomy, laparoscopic  2002  . Rotator cuff repair Bilateral 2003  . Tendon graft  2003    Bone graft  . Skin biopsy      Four times last 2005 - nose cancer  . Cataract extraction Bilateral 2005  . Cataract extraction w/ intraocular lens  implant, bilateral    . Cholecystectomy    . Wisdom tooth extraction    . Eye surgery    . Colonoscopy    . Hysteroscopy w/d&c N/A 12/18/2013    Procedure: DILATATION AND CURETTAGE /HYSTEROSCOPY;  Surgeon: Gus Height, MD;  Location: [facility];  Service: Gynecology;  Laterality: N/A;  . Robotic assisted total hysterectomy with bilateral salpingo oopherectomy N/A 02/17/2014    Procedure: ROBOTIC ASSISTED TOTAL HYSTERECTOMY WITH BILATERAL SALPINGO OOPHORECTOMY ;  Surgeon: Imagene Gurney A. Alycia Rossetti, MD;  Location: [facility];  Service: Gynecology;  Laterality: N/A;  . Lymph node dissection N/A 02/17/2014    Procedure: LYMPH NODE DISSECTION;  Surgeon: Imagene Gurney A. Alycia Rossetti, MD;  Location: [facility];  Service: Gynecology;  Laterality: N/A;   Social History:  reports that she has never smoked. She has never used  smokeless tobacco. She reports that she does not drink alcohol or use illicit drugs. Patient lives at home by herself & is able to participate in activities of daily living independently, still drives, loves to read. 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  Allergies  Allergen Reactions  . Benadryl [Diphenhydramine Hcl (Sleep)] Other (See Comments)    Light headed, heart racing.   .  Ciprofloxacin Other (See Comments)    Leg pains  . Pravastatin Other (See Comments)    Leg cramps  . Vioxx [Rofecoxib] Nausea Only  . Cephalosporins Rash    Family History  Problem Relation Age of Onset  . Heart disease Sister   . Diabetes Brother     x 4  . Colon cancer Sister   . Crohn's disease      Nephew  . Colon cancer Maternal Aunt   . Colon cancer Maternal Uncle   . Heart disease Brother   . Diabetes Sister     x 2  . Prostate cancer Brother   . Colon polyps      Nephew  . Leukemia Sister       Prior to Admission medications   Medication Sig Start Date End Date Taking? Authorizing Provider  acetaminophen (TYLENOL) 325 MG tablet Take 325-650 mg by mouth every 6 (six) hours as needed for mild pain or headache.   Yes Historical Provider, MD  losartan (COZAAR) 50 MG tablet Take 50 mg by mouth daily. 10/05/14  Yes Historical Provider, MD    Physical Exam: BP 117/59 mmHg  Pulse 51  Temp(Src) 98.2 F (36.8 C) (Oral)  Resp 15  Ht 5\' 1"  (1.549 m)  Wt 59.512 kg (131 lb 3.2 oz)  BMI 24.80 kg/m2  SpO2 99%  General:  NAD, left forehead laceration with dermabond Eyes: PERRL ENT: unremarkable Neck: supple, no JVD Cardiovascular: RRR Respiratory: CTABL Abdomen: soft/ND/ND, positive bowel sounds Skin: no rash Musculoskeletal:  No edema Psychiatric: calm/cooperative Neurologic: no focal findings  , chronic tremor, reported stable at baseline.          Labs on Admission:  Basic Metabolic Panel:  Recent Labs Lab 10/16/14 1541  NA 139  K 4.4  CL 110  CO2 23  GLUCOSE 97  BUN 30*  CREATININE 1.71*  CALCIUM 9.3   Liver Function Tests: No results for input(s): AST, ALT, ALKPHOS, BILITOT, PROT, ALBUMIN in the last 168 hours. No results for input(s): LIPASE, AMYLASE in the last 168 hours. No results for input(s): AMMONIA in the last 168 hours. CBC:  Recent Labs Lab 10/16/14 1541  WBC 6.7  NEUTROABS 4.9  HGB 11.2*  HCT 33.2*  MCV 93.5  PLT 181    Cardiac Enzymes:  Recent Labs Lab 10/16/14 1541  TROPONINI <0.03    BNP (last 3 results) No results for input(s): BNP in the last 8760 hours.  ProBNP (last 3 results) No results for input(s): PROBNP in the last 8760 hours.  CBG: No results for input(s): GLUCAP in the last 168 hours.  Radiological Exams on Admission: Ct Head Wo Contrast  10/16/2014   CLINICAL DATA:  Pain following fall hitting head  EXAM: CT HEAD WITHOUT CONTRAST  TECHNIQUE: Contiguous axial images were obtained from the base of the skull through the vertex without intravenous contrast.  COMPARISON:  November 07, 2013  FINDINGS: There is stable age related volume loss. There is no intracranial mass, hemorrhage, extra-axial fluid collection, or midline shift. Gray-white compartments are normal.  No acute infarct evident. The bony calvarium appears intact. The mastoid air cells are clear.  IMPRESSION: Study within normal limits for age. No intracranial mass, hemorrhage, or extra-axial fluid collection. The gray-white compartments are within normal limits.   Electronically Signed   By: Lowella Grip III M.D.   On: 10/16/2014 15:35   Dg Knee Complete 4 Views Right  10/16/2014   CLINICAL DATA:  81 year old female with acute right knee injury and pain. Initial encounter.  EXAM: RIGHT KNEE - COMPLETE 4+ VIEW  COMPARISON:  None.  FINDINGS: There is no evidence of acute fracture, subluxation or dislocation.  There is no evidence of joint effusion.  Tricompartmental degenerative changes are identified, severe in the lateral compartment and moderate in the medial and patellofemoral compartments. Chondrocalcinosis is identified. No suspicious focal bony lesions are present.  IMPRESSION: No evidence of acute abnormality.  Tricompartmental degenerative changes, severe in the lateral compartment.   Electronically Signed   By: Margarette Canada M.D.   On: 10/16/2014 15:25    EKG: Independently reviewed. Sinus rhythm, no acute st/t changes, QTc  wnl  Assessment/Plan Present on Admission:  . Syncope and collapse   Syncope:  patient reported intermittent palpitation for the last month. Palpitation stops with holding her breath. Denies chest pain, no sob. Baseline very active. Check tsh/on tele/cycle cardiac enzymes/ echo pending, cardiology consulted for concerning cardiogenic syncope. May need holter monitor. i have paged cardiology again, they state they will see the patient.  Headache: reported frontal headache, CT head no acute findings. Mri brain ordered.  Acute renal insufficiency: likely from dehydration, continue ivf, anticipate cr improvement. hold cozaar today, resume tomorrow. UA pending.  HTN: bp stable, resume cozaar tomorrow.   DVT prophylaxis: lovenox  Consultants: cardiology   Code Status: full   Family Communication:  Patient   Disposition Plan: admit to tele/obs  Time spent: 85mins  Artis Beggs MD, PhD [facility] Pager [phone] If 7PM-7AM, please contact night-coverage at [email or web address], password [facility]

## 2014-10-17 ENCOUNTER — Observation Stay (HOSPITAL_COMMUNITY): Payer: Medicare Other

## 2014-10-17 DIAGNOSIS — I10 Essential (primary) hypertension: Secondary | ICD-10-CM | POA: Diagnosis present

## 2014-10-17 DIAGNOSIS — N179 Acute kidney failure, unspecified: Secondary | ICD-10-CM | POA: Diagnosis present

## 2014-10-17 DIAGNOSIS — R55 Syncope and collapse: Secondary | ICD-10-CM | POA: Diagnosis not present

## 2014-10-17 DIAGNOSIS — I495 Sick sinus syndrome: Secondary | ICD-10-CM

## 2014-10-17 LAB — COMPREHENSIVE METABOLIC PANEL
ALK PHOS: 37 U/L — AB (ref 38–126)
ALT: 8 U/L — AB (ref 14–54)
ANION GAP: 6 (ref 5–15)
AST: 14 U/L — AB (ref 15–41)
Albumin: 2.7 g/dL — ABNORMAL LOW (ref 3.5–5.0)
BILIRUBIN TOTAL: 0.4 mg/dL (ref 0.3–1.2)
BUN: 29 mg/dL — ABNORMAL HIGH (ref 6–20)
CO2: 21 mmol/L — AB (ref 22–32)
CREATININE: 1.81 mg/dL — AB (ref 0.44–1.00)
Calcium: 8.6 mg/dL — ABNORMAL LOW (ref 8.9–10.3)
Chloride: 114 mmol/L — ABNORMAL HIGH (ref 101–111)
GFR calc Af Amer: 30 mL/min — ABNORMAL LOW (ref >60)
GFR calc non Af Amer: 25 mL/min — ABNORMAL LOW (ref >60)
Glucose, Bld: 88 mg/dL (ref 65–99)
POTASSIUM: 4.3 mmol/L (ref 3.5–5.1)
Sodium: 141 mmol/L (ref 135–145)
Total Protein: 4.9 g/dL — ABNORMAL LOW (ref 6.5–8.1)

## 2014-10-17 LAB — URINALYSIS, ROUTINE W REFLEX MICROSCOPIC
Bilirubin Urine: NEGATIVE
Glucose, UA: NEGATIVE mg/dL
Hgb urine dipstick: NEGATIVE
Ketones, ur: NEGATIVE mg/dL
Leukocytes, UA: NEGATIVE
NITRITE: NEGATIVE
Protein, ur: NEGATIVE mg/dL
Specific Gravity, Urine: 1.021 (ref 1.005–1.030)
UROBILINOGEN UA: 0.2 mg/dL (ref 0.0–1.0)
pH: 5.5 (ref 5.0–8.0)

## 2014-10-17 LAB — CBC
HEMATOCRIT: 27.8 % — AB (ref 36.0–46.0)
Hemoglobin: 9.1 g/dL — ABNORMAL LOW (ref 12.0–15.0)
MCH: 30.6 pg (ref 26.0–34.0)
MCHC: 32.7 g/dL (ref 30.0–36.0)
MCV: 93.6 fL (ref 78.0–100.0)
PLATELETS: 141 10*3/uL — AB (ref 150–400)
RBC: 2.97 MIL/uL — ABNORMAL LOW (ref 3.87–5.11)
RDW: 13.4 % (ref 11.5–15.5)
WBC: 4.3 10*3/uL (ref 4.0–10.5)

## 2014-10-17 LAB — TROPONIN I
Troponin I: <0.03 ng/mL (ref <0.031)
Troponin I: <0.03 ng/mL (ref <0.031)

## 2014-10-17 MED ORDER — SODIUM CHLORIDE 0.9 % IV SOLN
INTRAVENOUS | Status: DC
  Administered 2014-10-17 (×2): via INTRAVENOUS

## 2014-10-17 NOTE — Progress Notes (Signed)
Patient ID: LEXXI ZACHRY, female   DOB: Mar 16, 1933, 81 y.o.   MRN: [id number]    Patient Name: [name] Date of Encounter: 10/17/2014     Active Problems:   Syncope and collapse    SUBJECTIVE  C/o knee soreness. No chest pain or sob.  CURRENT MEDS . enoxaparin (LOVENOX) injection  30 mg Subcutaneous Q24H    OBJECTIVE  Filed Vitals:   10/16/14 1730 10/16/14 1904 10/16/14 1906 10/17/14 0407  BP: 117/59 128/48  107/53  Pulse: 51 52  53  Temp:  97.7 F (36.5 C)  98.5 F (36.9 C)  TempSrc:  Oral  Oral  Resp: 15 16  17   Height:   5\' 1"  (1.549 m)   Weight:   141 lb 1.5 oz (64 kg)   SpO2: 99% 100%  100%    Intake/Output Summary (Last 24 hours) at 10/17/14 0942 Last data filed at 10/17/14 0925  Gross per 24 hour  Intake 926.25 ml  Output    300 ml  Net 626.25 ml   Filed Weights   10/16/14 1437 10/16/14 1906  Weight: 131 lb 3.2 oz (59.512 kg) 141 lb 1.5 oz (64 kg)    PHYSICAL EXAM  General: Pleasant, NAD. Neuro: Alert and oriented X 3. Moves all extremities spontaneously. Psych: Normal affect. HEENT:  Normal except for periorbital ecchymosis around left eye.  Neck: Supple without bruits or JVD. Lungs:  Resp regular and unlabored, CTA. Heart: RRR no s3, s4, or murmurs. Abdomen: Soft, non-tender, non-distended, BS + x 4.  Extremities: No clubbing, cyanosis or edema. DP/PT/Radials 2+ and equal bilaterally.  Accessory Clinical Findings  CBC  Recent Labs  10/16/14 1541 10/16/14 1946 10/17/14 0710  WBC 6.7 8.0 4.3  NEUTROABS 4.9  --   --   HGB 11.2* 10.7* 9.1*  HCT 33.2* 32.5* 27.8*  MCV 93.5 93.4 93.6  PLT 181 183 [id number]*   Basic Metabolic Panel  Recent Labs  10/16/14 1541 10/16/14 1946 10/17/14 0710  NA 139  --  141  K 4.4  --  4.3  CL 110  --  114*  CO2 23  --  21*  GLUCOSE 97  --  88  BUN 30*  --  29*  CREATININE 1.71* 1.55* 1.81*  CALCIUM 9.3  --  8.6*   Liver Function Tests  Recent Labs  10/17/14 0710  AST 14*  ALT 8*  ALKPHOS  37*  BILITOT 0.4  PROT 4.9*  ALBUMIN 2.7*   No results for input(s): LIPASE, AMYLASE in the last 72 hours. Cardiac Enzymes  Recent Labs  10/16/14 1946 10/17/14 0034 10/17/14 0710  TROPONINI <0.03 <0.03 <0.03   BNP Invalid input(s): POCBNP D-Dimer No results for input(s): DDIMER in the last 72 hours. Hemoglobin A1C No results for input(s): HGBA1C in the last 72 hours. Fasting Lipid Panel No results for input(s): CHOL, HDL, LDLCALC, TRIG, CHOLHDL, LDLDIRECT in the last 72 hours. Thyroid Function Tests  Recent Labs  10/16/14 1946  TSH 0.646    TELE  Sinus bradycardia down into the low 40's  Radiology/Studies  Ct Head Wo Contrast  10/16/2014   CLINICAL DATA:  Pain following fall hitting head  EXAM: CT HEAD WITHOUT CONTRAST  TECHNIQUE: Contiguous axial images were obtained from the base of the skull through the vertex without intravenous contrast.  COMPARISON:  November 07, 2013  FINDINGS: There is stable age related volume loss. There is no intracranial mass, hemorrhage, extra-axial fluid collection, or midline shift. Gray-white compartments  are normal. No acute infarct evident. The bony calvarium appears intact. The mastoid air cells are clear.  IMPRESSION: Study within normal limits for age. No intracranial mass, hemorrhage, or extra-axial fluid collection. The gray-white compartments are within normal limits.   Electronically Signed   By: Lowella Grip III M.D.   On: 10/16/2014 15:35   Mr Brain Wo Contrast  10/16/2014   CLINICAL DATA:  Headache  EXAM: MRI HEAD WITHOUT CONTRAST  TECHNIQUE: Multiplanar, multiecho pulse sequences of the brain and surrounding structures were obtained without intravenous contrast.  COMPARISON:  CT head 10/16/2014, MRI 04/19/2006  FINDINGS: Mild atrophy.  Negative for hydrocephalus.  Pituitary normal in size. Bilateral lens replacements. No orbital mass. Paranasal sinuses clear. Cervical medullary junction normal. Cervical spondylosis noted.  Negative  for acute infarct.  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 Small white matter hyperintensities have developed since the prior study consistent with very mild chronic microvascular ischemia. Small chronic infarcts left inferior cerebellum unchanged from the prior study.  Negative for hemorrhage or mass lesion.  IMPRESSION: Atrophy and mild chronic ischemic change.  No acute abnormality.   Electronically Signed   By: Franchot Gallo M.D.   On: 10/16/2014 21:10   Dg Knee Complete 4 Views Right  10/16/2014   CLINICAL DATA:  81 year old female with acute right knee injury and pain. Initial encounter.  EXAM: RIGHT KNEE - COMPLETE 4+ VIEW  COMPARISON:  None.  FINDINGS: There is no evidence of acute fracture, subluxation or dislocation.  There is no evidence of joint effusion.  Tricompartmental degenerative changes are identified, severe in the lateral compartment and moderate in the medial and patellofemoral compartments. Chondrocalcinosis is identified. No suspicious focal bony lesions are present.  IMPRESSION: No evidence of acute abnormality.  Tricompartmental degenerative changes, severe in the lateral compartment.   Electronically Signed   By: Margarette Canada M.D.   On: 10/16/2014 15:25    ASSESSMENT AND PLAN  1. Syncope - her description, as it occurred while going from sitting to standing, sounds more like orthostasis. She is bradycardic raising the question of sinus node dysfunction. I would allow her to go home and we will arrange an 30 day monitor as an outpatient. I have asked her not to drive for 6 months or until we find out why she passed out. For now avoid all av or sinus nodal blocking drugs.  2. Acute on chronic stage 3 renal insuff. - as per her hospital team.   Carleene Overlie Taylor,M.D.  10/17/2014 9:42 AM

## 2014-10-17 NOTE — Evaluation (Signed)
Physical Therapy Evaluation Patient Details Name: [name] MRN: [id number] DOB: 1932-10-07 Today's Date: 10/17/2014   History of Present Illness  81 yo female with onset of bradycardia with recent fall from syncopal episode while home alone.    Clinical Impression  Pt was seen for mobility assessment and is definitely a fall risk.  Her plan is to go home with a friend to stay with her, allowing pt time to regain independent mobility as she is going to be on a holter monitor and has recent syncopal episode.  Will anticipate her family checking in on her.    Follow Up Recommendations Home health PT;Supervision/Assistance - 24 hour    Equipment Recommendations  3in1 (PT);Other (comment) (shower chair per family)    Recommendations for Other Services       Precautions / Restrictions Precautions Precautions: Fall;Other (comment) (telemetry) Restrictions Weight Bearing Restrictions: No      Mobility  Bed Mobility Overal bed mobility: Modified Independent                Transfers Overall transfer level: Modified independent Equipment used: Rolling walker (2 wheeled)             General transfer comment: used RW to control R knee pain  Ambulation/Gait Ambulation/Gait assistance: Min guard;Min assist Ambulation Distance (Feet): 150 Feet Assistive device: Rolling walker (2 wheeled);1 person hand held assist Gait Pattern/deviations: Step-through pattern;Wide base of support;Trunk flexed;Decreased dorsiflexion - right Gait velocity: slightly slower Gait velocity interpretation: Below normal speed for age/gender General Gait Details: short steps but maintains use of RW correctly for turns and to stand inside  Stairs            Wheelchair Mobility    Modified Rankin (Stroke Patients Only)       Balance Overall balance assessment: Needs assistance         Standing balance support: Bilateral upper extremity supported Standing balance-Leahy Scale:  Fair Standing balance comment: fair- with dynamic activity                             Pertinent Vitals/Pain Pain Assessment: Faces Pain Score: 4  Faces Pain Scale: Hurts little more Pain Location: R knee Pain Intervention(s): Limited activity within patient's tolerance;Monitored during session    [place] expects to be discharged to:: Private residence Living Arrangements: Alone Available Help at Discharge: Friend(s) Type of Home: House Home Access: Level entry     Home Layout: One level Home Equipment: Cane - single point      Prior Function Level of Independence: Needs assistance (Has family who check in on her, none to stay with her)   Gait / Transfers Assistance Needed: independent with no AD  ADL's / Homemaking Assistance Needed: Lives alone and reportedly does her own  Comments: Pt clearly cannot detail any personal history which is her PLOF.     Hand Dominance        Extremity/Trunk Assessment   Upper Extremity Assessment: Overall WFL for tasks assessed           Lower Extremity Assessment: Overall WFL for tasks assessed;RLE deficits/detail RLE Deficits / Details: has intact R knee ligaments and  effusion on medial aspect of knee    Cervical / Trunk Assessment: Kyphotic  Communication   Communication: Other (comment) (Confusion)  Cognition Arousal/Alertness: Awake/alert Behavior During Therapy: WFL for tasks assessed/performed Overall Cognitive Status: History of cognitive impairments - at baseline  Memory: Decreased short-term memory;Decreased recall of precautions              General Comments General comments (skin integrity, edema, etc.): Pt is having some increased R knee pain s/p fall with syncopal episode, very motivated to get up to walk.  Her gait is unsteady to a small degree with fairly good control of walker.  Would recommend 24/7 help at home until pt is steady with gait.  Will be home on a  holter monitor.    Exercises        Assessment/Plan    PT Assessment Patient needs continued PT services  PT Diagnosis Acute pain   PT Problem List Decreased strength;Decreased range of motion;Decreased activity tolerance;Decreased balance;Decreased mobility;Decreased coordination;Decreased cognition;Decreased knowledge of use of DME;Decreased safety awareness;Cardiopulmonary status limiting activity;Pain  PT Treatment Interventions DME instruction;Gait training;Stair training;Functional mobility training;Therapeutic activities;Therapeutic exercise;Balance training;Neuromuscular re-education;Cognitive remediation;Patient/family education   PT Goals (Current goals can be found in the Care Plan section) Acute Rehab PT Goals Patient Stated Goal: to have her R knee feel better PT Goal Formulation: With patient/family Time For Goal Achievement: 10/31/14 Potential to Achieve Goals: Good    Frequency Min 2X/week   Barriers to discharge Decreased caregiver support no help at home     Co-evaluation               End of Session Equipment Utilized During Treatment: Gait belt Activity Tolerance: Patient tolerated treatment well;No increased pain Patient left: in bed;with call bell/phone within reach;with bed alarm set;with family/visitor present Nurse Communication: Mobility status    Functional Assessment Tool Used: clinical judgment Functional Limitation: Mobility: Walking and moving around Mobility: Walking and Moving Around Current Status [id number]): At least 1 percent but less than 20 percent impaired, limited or restricted Mobility: Walking and Moving Around Goal Status [phone]): At least 1 percent but less than 20 percent impaired, limited or restricted    Time: 1345-1412 PT Time Calculation (min) (ACUTE ONLY): 27 min   Charges:   PT Evaluation $Initial PT Evaluation Tier I: 1 Procedure PT Treatments $Gait Training: 8-22 mins   PT G Codes:   PT G-Codes **NOT FOR  INPATIENT CLASS** Functional Assessment Tool Used: clinical judgment Functional Limitation: Mobility: Walking and moving around Mobility: Walking and Moving Around Current Status [id number]): At least 1 percent but less than 20 percent impaired, limited or restricted Mobility: Walking and Moving Around Goal Status [phone]): At least 1 percent but less than 20 percent impaired, limited or restricted    Ramond Dial 10/17/2014, 3:13 PM   Mee Hives, PT MS Acute Rehab Dept. Number: [facility] [id number] and [place] [phone]

## 2014-10-17 NOTE — Progress Notes (Signed)
Patient Demographics:    [name], is a 81 y.o. female, DOB - 12-31-1932, IQ:[id number]  Admit date - 10/16/2014   Admitting Physician [name], MD  Outpatient Primary MD for the patient is [name], MD  LOS -    Chief Complaint  Patient presents with  . Fall    questional mechanism, does not remember fall but has hx of short term memory loss  . Loss of Consciousness        Subjective:    [facility] today has, No headache, No chest pain, No abdominal pain - No Nausea, No new weakness tingling or numbness, No Cough - SOB.    Assessment  & Plan :     1. Syncope and collapse. Not orthostatic, question dysrhythmia, for now stable on telemetry, CT MRI head nonacute, no focal deficits. No history suggestive of seizure like activity. Check echocardiogram, PT eval, increase activity. Per cardiology once stable discharge home with 30 day event monitor.   2. ARF. Creatinine worse this morning, discontinue ARB, hydrate, check renal ultrasound, repeat BMP in the morning.   3. Essential hypertension. Blood pressure soft hold off blood pressure medications, we will try to run blood pressure is mildly high to improve renal function.    Code Status : Full  Family Communication  : none present  Disposition Plan  : Home 1-2 days  Consults  :  Cards  Procedures  :  CT and MRI head both unremarkable.  Echocardiogram and Renal ultrasound ordered, both pending   DVT Prophylaxis  :  Lovenox    Lab Results  Component Value Date   PLT 141* 10/17/2014    Inpatient Medications  Scheduled Meds: . enoxaparin (LOVENOX) injection  30 mg Subcutaneous Q24H   Continuous Infusions: . sodium chloride 75 mL/hr at 10/17/14 0925   PRN Meds:.acetaminophen  Antibiotics  :     Anti-infectives     None        Objective:   Filed Vitals:   10/16/14 1730 10/16/14 1904 10/16/14 1906 10/17/14 0407  BP: 117/59 128/48  107/53  Pulse: 51 52  53  Temp:  97.7 F (36.5 C)  98.5 F (36.9 C)  TempSrc:  Oral  Oral  Resp: 15 16  17   Height:   5\' 1"  (1.549 m)   Weight:   64 kg (141 lb 1.5 oz)   SpO2: 99% 100%  100%    Wt Readings from Last 3 Encounters:  10/16/14 64 kg (141 lb 1.5 oz)  10/14/14 62.234 kg (137 lb 3.2 oz)  03/25/14 63.05 kg (139 lb)     Intake/Output Summary (Last 24 hours) at 10/17/14 1039 Last data filed at 10/17/14 0925  Gross per 24 hour  Intake 926.25 ml  Output    300 ml  Net 626.25 ml     Physical Exam  Awake Alert, Oriented X 3, No new F.N deficits, Normal affect [place].AT,PERRAL, left periorbital bruise stable Supple Neck,No JVD, No cervical lymphadenopathy appriciated.  Symmetrical Chest wall movement, Good air movement bilaterally, CTAB RRR,No Gallops,Rubs or new Murmurs, No Parasternal Heave +ve B.Sounds, Abd Soft, No tenderness, No organomegaly appriciated, No rebound - guarding or rigidity. No Cyanosis, Clubbing or edema, No new Rash or bruise  Data Review:   Micro Results No results found for this or any previous visit (from the past 240 hour(s)).  Radiology Reports Ct Head Wo Contrast  10/16/2014   CLINICAL DATA:  Pain following fall hitting head  EXAM: CT HEAD WITHOUT CONTRAST  TECHNIQUE: Contiguous axial images were obtained from the base of the skull through the vertex without intravenous contrast.  COMPARISON:  November 07, 2013  FINDINGS: There is stable age related volume loss. There is no intracranial mass, hemorrhage, extra-axial fluid collection, or midline shift. Gray-white compartments are normal. No acute infarct evident. The bony calvarium appears intact. The mastoid air cells are clear.  IMPRESSION: Study within normal limits for age. No intracranial mass, hemorrhage, or extra-axial fluid collection. The gray-white compartments  are within normal limits.   Electronically Signed   By: Lowella Grip III M.D.   On: 10/16/2014 15:35   Mr Brain Wo Contrast  10/16/2014   CLINICAL DATA:  Headache  EXAM: MRI HEAD WITHOUT CONTRAST  TECHNIQUE: Multiplanar, multiecho pulse sequences of the brain and surrounding structures were obtained without intravenous contrast.  COMPARISON:  CT head 10/16/2014, MRI 04/19/2006  FINDINGS: Mild atrophy.  Negative for hydrocephalus.  Pituitary normal in size. Bilateral lens replacements. No orbital mass. Paranasal sinuses clear. Cervical medullary junction normal. Cervical spondylosis noted.  Negative for acute infarct.  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 Small white matter hyperintensities have developed since the prior study consistent with very mild chronic microvascular ischemia. Small chronic infarcts left inferior cerebellum unchanged from the prior study.  Negative for hemorrhage or mass lesion.  IMPRESSION: Atrophy and mild chronic ischemic change.  No acute abnormality.   Electronically Signed   By: Franchot Gallo M.D.   On: 10/16/2014 21:10   Dg Knee Complete 4 Views Right  10/16/2014   CLINICAL DATA:  81 year old female with acute right knee injury and pain. Initial encounter.  EXAM: RIGHT KNEE - COMPLETE 4+ VIEW  COMPARISON:  None.  FINDINGS: There is no evidence of acute fracture, subluxation or dislocation.  There is no evidence of joint effusion.  Tricompartmental degenerative changes are identified, severe in the lateral compartment and moderate in the medial and patellofemoral compartments. Chondrocalcinosis is identified. No suspicious focal bony lesions are present.  IMPRESSION: No evidence of acute abnormality.  Tricompartmental degenerative changes, severe in the lateral compartment.   Electronically Signed   By: Margarette Canada M.D.   On: 10/16/2014 15:25     CBC  Recent Labs Lab 10/16/14 1541 10/16/14 1946 10/17/14 0710  WBC 6.7 8.0 4.3  HGB 11.2* 10.7* 9.1*    HCT 33.2* 32.5* 27.8*  PLT 181 183 141*  MCV 93.5 93.4 93.6  MCH 31.5 30.7 30.6  MCHC 33.7 32.9 32.7  RDW 13.3 13.6 13.4  LYMPHSABS 1.2  --   --   MONOABS 0.5  --   --   EOSABS 0.1  --   --   BASOSABS 0.0  --   --     Chemistries   Recent Labs Lab 10/16/14 1541 10/16/14 1946 10/17/14 0710  NA 139  --  141  K 4.4  --  4.3  CL 110  --  114*  CO2 23  --  21*  GLUCOSE 97  --  88  BUN 30*  --  29*  CREATININE 1.71* 1.55* 1.81*  CALCIUM 9.3  --  8.6*  AST  --   --  14*  ALT  --   --  8*  ALKPHOS  --   --  37*  BILITOT  --   --  0.4   ------------------------------------------------------------------------------------------------------------------ estimated creatinine clearance is 21.6 mL/min (by C-G formula based on Cr of 1.81). ------------------------------------------------------------------------------------------------------------------ No results for input(s): HGBA1C in the last 72 hours. ------------------------------------------------------------------------------------------------------------------ No results for input(s): CHOL, HDL, LDLCALC, TRIG, CHOLHDL, LDLDIRECT in the last 72 hours. ------------------------------------------------------------------------------------------------------------------  Recent Labs  10/16/14 1946  TSH 0.646   ------------------------------------------------------------------------------------------------------------------ No results for input(s): VITAMINB12, FOLATE, FERRITIN, TIBC, IRON, RETICCTPCT in the last 72 hours.  Coagulation profile No results for input(s): INR, PROTIME in the last 168 hours.  No results for input(s): DDIMER in the last 72 hours.  Cardiac Enzymes  Recent Labs Lab 10/16/14 1946 10/17/14 0034 10/17/14 0710  TROPONINI <0.03 <0.03 <0.03   ------------------------------------------------------------------------------------------------------------------ Invalid input(s): POCBNP   Time Spent in  minutes  30   SINGH,PRASHANT K M.D on 10/17/2014 at 10:39 AM  Between 7am to 7pm - Pager - [phone]  After 7pm go to [email or web address] - password [facility]  [facility]   Office  [phone]

## 2014-10-18 ENCOUNTER — Other Ambulatory Visit: Payer: Self-pay | Admitting: Physician Assistant

## 2014-10-18 ENCOUNTER — Other Ambulatory Visit (HOSPITAL_COMMUNITY): Payer: Medicare Other

## 2014-10-18 DIAGNOSIS — N179 Acute kidney failure, unspecified: Secondary | ICD-10-CM

## 2014-10-18 DIAGNOSIS — I10 Essential (primary) hypertension: Secondary | ICD-10-CM | POA: Diagnosis not present

## 2014-10-18 DIAGNOSIS — R001 Bradycardia, unspecified: Secondary | ICD-10-CM | POA: Diagnosis present

## 2014-10-18 DIAGNOSIS — R55 Syncope and collapse: Secondary | ICD-10-CM

## 2014-10-18 LAB — BASIC METABOLIC PANEL
Anion gap: 3 — ABNORMAL LOW (ref 5–15)
BUN: 23 mg/dL — ABNORMAL HIGH (ref 6–20)
CO2: 22 mmol/L (ref 22–32)
Calcium: 8.7 mg/dL — ABNORMAL LOW (ref 8.9–10.3)
Chloride: 115 mmol/L — ABNORMAL HIGH (ref 101–111)
Creatinine, Ser: 1.26 mg/dL — ABNORMAL HIGH (ref 0.44–1.00)
GFR, EST AFRICAN AMERICAN: 46 mL/min — AB (ref >60)
GFR, EST NON AFRICAN AMERICAN: 39 mL/min — AB (ref >60)
GLUCOSE: 82 mg/dL (ref 65–99)
POTASSIUM: 4.1 mmol/L (ref 3.5–5.1)
SODIUM: 140 mmol/L (ref 135–145)

## 2014-10-18 LAB — CBC
HCT: 27.2 % — ABNORMAL LOW (ref 36.0–46.0)
HEMOGLOBIN: 9.1 g/dL — AB (ref 12.0–15.0)
MCH: 31.6 pg (ref 26.0–34.0)
MCHC: 33.5 g/dL (ref 30.0–36.0)
MCV: 94.4 fL (ref 78.0–100.0)
Platelets: 135 10*3/uL — ABNORMAL LOW (ref 150–400)
RBC: 2.88 MIL/uL — AB (ref 3.87–5.11)
RDW: 13.6 % (ref 11.5–15.5)
WBC: 4.3 10*3/uL (ref 4.0–10.5)

## 2014-10-18 NOTE — Discharge Summary (Signed)
Discharge Summary  [name] [id number] DOB: [birth year]  PCP: [name], MD  Admit date: 10/16/2014 Discharge date: 10/18/2014  Time spent: 25 minutes  Recommendations for Outpatient Follow-up:  1. Patient will stop Cozaar upon discharge. This can be resumed as per her cardiologist 2. Patient's cardiologist will follow-up with patient this week admit that time 30 day event monitor and echocardiogram can be obtained   Discharge Diagnoses:  Active Hospital Problems   Diagnosis Date Noted  . Syncope and collapse 10/16/2014  . ARF (acute renal failure) 10/17/2014  . Essential hypertension 10/17/2014  . Constipation, chronic 06/13/2011    Resolved Hospital Problems   Diagnosis Date Noted Date Resolved  No resolved problems to display.    Discharge Condition: Improved, being discharged home. Patient will stay with her sister for the next few days  Diet recommendation: Heart healthy  Filed Weights   10/16/14 1437 10/16/14 1906 10/17/14 2055  Weight: 59.512 kg (131 lb 3.2 oz) 64 kg (141 lb 1.5 oz) 65 kg (143 lb 4.8 oz)    History of present illness:  Patient is a 81 year old female with past oral history of hypertension and endometrial cancer status post hysterectomy who was admitted on 7/8 after a syncopal event. Patient was in her usual state of health and all sure members was getting up to stand and then sudden onset passing out. She had no warning. No previous history of this. When she woke up, with what appeared to be a few minutes later, she had some bruising and a small laceration on the left side of her face. Brought into the emergency room and she was noted to be mildly orthostatic along with some mild acute kidney injury. CT scan of the head, EKG and troponins were unremarkable. Laceration treated. Patient brought in for observation.  Hospital Course:  Principal Problem:   Syncope and collapse: While on telemetry, heart rate got as low as sustained 30s while  sleeping. Although at other times, in the 80s. See my cardiology, they recommended 30 day event monitor. TSH normal. By 7/10, patient was hydrated and feeling better.  Cardiology followed up and recommended outpatient follow-up with Dr. Wynonia Lawman, patient's cardiologist this week and at that time event monitor and echocardiogram can be done in his office. Active Problems:   Constipation, chronic: Stable    Essential hypertension: Given mild plus with IV fluids, borderline blood pressures, at this time recommending holding patient's Cozaar once she is discharged. Her cardiologist can resume this this week if indicated   bradycardia: As mentioned above. Patient not on any rate limiting agents. Needs 30 day event monitor.  Acute kidney injury: Secondary to dehydration. Unclear if this was a contracting factor to her fall or discolored afterwards. Active baseline phone IV fluids.  Procedures:  None  Consultations:  Cardiology  Discharge Exam: BP 97/67 mmHg  Pulse 58  Temp(Src) 97.1 F (36.2 C) (Oral)  Resp 16  Ht 5\' 1"  (1.549 m)  Wt 65 kg (143 lb 4.8 oz)  BMI 27.09 kg/m2  SpO2 97%  General: Alert and oriented 3, no acute distress Cardiovascular: Regular rhythm, borderline tachycardia Respiratory: Clear to auscultation bilaterally  Discharge Instructions You were cared for by a hospitalist during your hospital stay. If you have any questions about your discharge medications or the care you received while you were in the hospital after you are discharged, you can call the unit and asked to speak with the hospitalist on call if the hospitalist that took care of  you is not available. Once you are discharged, your primary care physician will handle any further medical issues. Please note that NO REFILLS for any discharge medications will be authorized once you are discharged, as it is imperative that you return to your primary care physician (or establish a relationship with a primary care  physician if you do not have one) for your aftercare needs so that they can reassess your need for medications and monitor your lab values.  Discharge Instructions    Diet - low sodium heart healthy    Complete by:  As directed      Increase activity slowly    Complete by:  As directed             Medication List    STOP taking these medications        losartan 50 MG tablet  Commonly known as:  COZAAR      TAKE these medications        acetaminophen 325 MG tablet  Commonly known as:  TYLENOL  Take 325-650 mg by mouth every 6 (six) hours as needed for mild pain or headache.       Allergies  Allergen Reactions  . Benadryl [Diphenhydramine Hcl (Sleep)] Other (See Comments)    Light headed, heart racing.   . Ciprofloxacin Other (See Comments)    Leg pains  . Pravastatin Other (See Comments)    Leg cramps  . Vioxx [Rofecoxib] Nausea Only  . Cephalosporins Rash       Follow-up Information    Follow up with Ezzard Standing, MD. Call in 1 day.   Specialty:  Cardiology   Why:  To get event monitor put on and arrange a followup appointment.   Contact information:   [address] [place] [address] [phone]        The results of significant diagnostics from this hospitalization (including imaging, microbiology, ancillary and laboratory) are listed below for reference.    Significant Diagnostic Studies: Ct Head Wo Contrast  10/16/2014   CLINICAL DATA:  Pain following fall hitting head  EXAM: CT HEAD WITHOUT CONTRAST  TECHNIQUE: Contiguous axial images were obtained from the base of the skull through the vertex without intravenous contrast.  COMPARISON:  November 07, 2013  FINDINGS: There is stable age related volume loss. There is no intracranial mass, hemorrhage, extra-axial fluid collection, or midline shift. Gray-white compartments are normal. No acute infarct evident. The bony calvarium appears intact. The mastoid air cells are clear.  IMPRESSION:  Study within normal limits for age. No intracranial mass, hemorrhage, or extra-axial fluid collection. The gray-white compartments are within normal limits.   Electronically Signed   By: Lowella Grip III M.D.   On: 10/16/2014 15:35   Mr Brain Wo Contrast  10/16/2014   CLINICAL DATA:  Headache  EXAM: MRI HEAD WITHOUT CONTRAST  TECHNIQUE: Multiplanar, multiecho pulse sequences of the brain and surrounding structures were obtained without intravenous contrast.  COMPARISON:  CT head 10/16/2014, MRI 04/19/2006  FINDINGS: Mild atrophy.  Negative for hydrocephalus.  Pituitary normal in size. Bilateral lens replacements. No orbital mass. Paranasal sinuses clear. Cervical medullary junction normal. Cervical spondylosis noted.  Negative for acute infarct.  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 Small white matter hyperintensities have developed since the prior study consistent with very mild chronic microvascular ischemia. Small chronic infarcts left inferior cerebellum unchanged from the prior study.  Negative for hemorrhage or mass lesion.  IMPRESSION: Atrophy and mild  chronic ischemic change.  No acute abnormality.   Electronically Signed   By: Franchot Gallo M.D.   On: 10/16/2014 21:10   US Renal  10/17/2014   CLINICAL DATA:  Acute renal failure.  History of endometrial cancer.  EXAM: RENAL / URINARY TRACT ULTRASOUND COMPLETE  COMPARISON:  CT scan 01/06/2014  FINDINGS: Right Kidney:  Length: 8.8 cm. Renal cortical thinning and diffuse increased echogenicity consistent with medical renal disease. There are numerous small renal cysts as noted on the prior CT scan. No hydronephrosis.  Left Kidney:  Length: 8.5 cm. Renal cortical thinning and increased echogenicity consistent with medical renal disease. Numerous small cysts has noted on the prior CT scan. No hydronephrosis.  Bladder:  Appears normal for degree of bladder distention.  IMPRESSION: Small echogenic kidneys with renal cortical thinning  consistent with medical renal disease.  Numerous bilateral renal cysts but no worrisome renal lesions or hydronephrosis.   Electronically Signed   By: Marijo Sanes M.D.   On: 10/17/2014 15:59   Dg Knee Complete 4 Views Right  10/16/2014   CLINICAL DATA:  81 year old female with acute right knee injury and pain. Initial encounter.  EXAM: RIGHT KNEE - COMPLETE 4+ VIEW  COMPARISON:  None.  FINDINGS: There is no evidence of acute fracture, subluxation or dislocation.  There is no evidence of joint effusion.  Tricompartmental degenerative changes are identified, severe in the lateral compartment and moderate in the medial and patellofemoral compartments. Chondrocalcinosis is identified. No suspicious focal bony lesions are present.  IMPRESSION: No evidence of acute abnormality.  Tricompartmental degenerative changes, severe in the lateral compartment.   Electronically Signed   By: Margarette Canada M.D.   On: 10/16/2014 15:25    Microbiology: No results found for this or any previous visit (from the past 240 hour(s)).   Labs: Basic Metabolic Panel:  Recent Labs Lab 10/16/14 1541 10/16/14 1946 10/17/14 0710 10/18/14 0345  NA 139  --  141 140  K 4.4  --  4.3 4.1  CL 110  --  114* 115*  CO2 23  --  21* 22  GLUCOSE 97  --  88 82  BUN 30*  --  29* 23*  CREATININE 1.71* 1.55* 1.81* 1.26*  CALCIUM 9.3  --  8.6* 8.7*   Liver Function Tests:  Recent Labs Lab 10/17/14 0710  AST 14*  ALT 8*  ALKPHOS 37*  BILITOT 0.4  PROT 4.9*  ALBUMIN 2.7*   No results for input(s): LIPASE, AMYLASE in the last 168 hours. No results for input(s): AMMONIA in the last 168 hours. CBC:  Recent Labs Lab 10/16/14 1541 10/16/14 1946 10/17/14 0710 10/18/14 0345  WBC 6.7 8.0 4.3 4.3  NEUTROABS 4.9  --   --   --   HGB 11.2* 10.7* 9.1* 9.1*  HCT 33.2* 32.5* 27.8* 27.2*  MCV 93.5 93.4 93.6 94.4  PLT 181 183 141* 135*   Cardiac Enzymes:  Recent Labs Lab 10/16/14 1541 10/16/14 1946 10/17/14 0034  10/17/14 0710  TROPONINI <0.03 <0.03 <0.03 <0.03   BNP: BNP (last 3 results) No results for input(s): BNP in the last 8760 hours.  ProBNP (last 3 results) No results for input(s): PROBNP in the last 8760 hours.  CBG: No results for input(s): GLUCAP in the last 168 hours.     Signed:  Annita Brod  [facility] 10/18/2014, 2:52 PM

## 2014-10-18 NOTE — Progress Notes (Signed)
10/18/2014 12:00 PM  Curtice to be D/C'd Home per MD order.  Discussed prescriptions and follow up appointments with the patient. Prescriptions given to patient, medication list explained in detail. Pt verbalized understanding.    Medication List    STOP taking these medications        losartan 50 MG tablet  Commonly known as:  COZAAR      TAKE these medications        acetaminophen 325 MG tablet  Commonly known as:  TYLENOL  Take 325-650 mg by mouth every 6 (six) hours as needed for mild pain or headache.        Filed Vitals:   10/18/14 0805  BP: 97/67  Pulse: 58  Temp: 97.1 F (36.2 C)  Resp: 16    Skin clean, dry and intact without evidence of skin break down, no evidence of skin tears noted. IV catheter discontinued intact. Site without signs and symptoms of complications. Dressing and pressure applied. Pt denies pain at this time. No complaints noted.  An After Visit Summary was printed and given to the patient. Patient escorted via [place], and D/C home via private auto.  [employer], RN-BC, [employer] [facility] [address] Phone [phone]

## 2014-10-18 NOTE — Progress Notes (Signed)
10/18/2014 11:27 AM  Per MD verbal order patient does need an ECHO and will been outpatient. Patient can still be discharged. Informed patient. Stated that she is looking for a ride due to Family being in church. Will continue to assess and monitor the patient.   [employer], RN-BC, [employer] [facility] [address] Phone [phone]

## 2014-10-18 NOTE — Progress Notes (Addendum)
HR mid to high 30s while asleep. Up to 80 at other times. SR. Have sent message to Dr Wynonia Lawman to arrange event monitor and f/u appt.   Lenoard Aden 10/18/2014 9:38 AM Beeper [phone]   EP Attending  Agree with above. I saw patient yesterday. Plan is as above.   Mikle Bosworth.D.

## 2014-10-18 NOTE — Discharge Instructions (Addendum)
Syncope °Syncope is a medical term for fainting or passing out. This means you lose consciousness and drop to the ground. People are generally unconscious for less than 5 minutes. You may have some muscle twitches for up to 15 seconds before waking up and returning to normal. Syncope occurs more often in older adults, but it can happen to anyone. While most causes of syncope are not dangerous, syncope can be a sign of a serious medical problem. It is important to seek medical care.  °CAUSES  °Syncope is caused by a sudden drop in blood flow to the brain. The specific cause is often not determined. Factors that can bring on syncope include: °· Taking medicines that lower blood pressure. °· Sudden changes in posture, such as standing up quickly. °· Taking more medicine than prescribed. °· Standing in one place for too long. °· Seizure disorders. °· Dehydration and excessive exposure to heat. °· Low blood sugar (hypoglycemia). °· Straining to have a bowel movement. °· Heart disease, irregular heartbeat, or other circulatory problems. °· Fear, emotional distress, seeing blood, or severe pain. °SYMPTOMS  °Right before fainting, you may: °· Feel dizzy or light-headed. °· Feel nauseous. °· See all white or all black in your field of vision. °· Have cold, clammy skin. °DIAGNOSIS  °Your health care provider will ask about your symptoms, perform a physical exam, and perform an electrocardiogram (ECG) to record the electrical activity of your heart. Your health care provider may also perform other heart or blood tests to determine the cause of your syncope which may include: °· Transthoracic echocardiogram (TTE). During echocardiography, sound waves are used to evaluate how blood flows through your heart. °· Transesophageal echocardiogram (TEE). °· Cardiac monitoring. This allows your health care provider to monitor your heart rate and rhythm in real time. °· Holter monitor. This is a portable device that records your  heartbeat and can help diagnose heart arrhythmias. It allows your health care provider to track your heart activity for several days, if needed. °· Stress tests by exercise or by giving medicine that makes the heart beat faster. °TREATMENT  °In most cases, no treatment is needed. Depending on the cause of your syncope, your health care provider may recommend changing or stopping some of your medicines. °HOME CARE INSTRUCTIONS °· Have someone stay with you until you feel stable. °· Do not drive, use machinery, or play sports until your health care provider says it is okay. °· Keep all follow-up appointments as directed by your health care provider. °· Lie down right away if you start feeling like you might faint. Breathe deeply and steadily. Wait until all the symptoms have passed. °· Drink enough fluids to keep your urine clear or pale yellow. °· If you are taking blood pressure or heart medicine, get up slowly and take several minutes to sit and then stand. This can reduce dizziness. °SEEK IMMEDIATE MEDICAL CARE IF:  °· You have a severe headache. °· You have unusual pain in the chest, abdomen, or back. °· You are bleeding from your mouth or rectum, or you have black or tarry stool. °· You have an irregular or very fast heartbeat. °· You have pain with breathing. °· You have repeated fainting or seizure-like jerking during an episode. °· You faint when sitting or lying down. °· You have confusion. °· You have trouble walking. °· You have severe weakness. °· You have vision problems. °If you fainted, call your local emergency services (911 in [place] Do not drive   yourself to the hospital.  °MAKE SURE YOU: °· Understand these instructions. °· Will watch your condition. °· Will get help right away if you are not doing well or get worse. °Document Released: 03/27/2005 Document Revised: 04/01/2013 Document Reviewed: 05/26/2011 °ExitCare® Patient Information ©2015 [employer]. This information is not intended to replace  advice given to you by your health care provider. Make sure you discuss any questions you have with your health care provider. ° °

## 2015-05-05 ENCOUNTER — Ambulatory Visit: Payer: Medicare Other | Attending: Gynecologic Oncology | Admitting: Gynecologic Oncology

## 2015-05-05 ENCOUNTER — Other Ambulatory Visit (HOSPITAL_COMMUNITY)
Admission: RE | Admit: 2015-05-05 | Discharge: 2015-05-05 | Disposition: A | Discharge status: Home / Self Care | Payer: Medicare Other | Source: Ambulatory Visit | Attending: Gynecologic Oncology | Admitting: Gynecologic Oncology

## 2015-05-05 ENCOUNTER — Encounter: Payer: Self-pay | Admitting: Gynecologic Oncology

## 2015-05-05 VITALS — BP 124/63 | HR 59 | Temp 97.5°F | Resp 18 | Ht 61.0 in | Wt 130.4 lb

## 2015-05-05 DIAGNOSIS — Z01411 Encounter for gynecological examination (general) (routine) with abnormal findings: Secondary | ICD-10-CM | POA: Insufficient documentation

## 2015-05-05 DIAGNOSIS — Z1504 Genetic susceptibility to malignant neoplasm of endometrium: Secondary | ICD-10-CM | POA: Insufficient documentation

## 2015-05-05 DIAGNOSIS — C541 Malignant neoplasm of endometrium: Secondary | ICD-10-CM | POA: Diagnosis not present

## 2015-05-05 NOTE — Progress Notes (Signed)
Consult Note: Gyn-Onc  CC: Dr Harle Battiest Chief Complaint  Patient presents with  . Endometrial cancer    MD Follow up visit    Assessment/Plan:  [name]  is a 82 y.o. 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  Her final pathology is a stage IA grade 2 endometrioid adenocarcinoma with 66% myometrial invasion. 0 out of 12 lymph nodes were involved and there was no lymphovascular space involvement. Per GOG 99 she would benefit from vaginal cuff brachytherapy. She ultimately declined radiation therapy after seeing Dr. Sondra Come.   HPI: Ms [name] is a 82 year old very healthy woman with a 4-6 month history of abnormal bloody vaginal discharge. She is nulliparous and virginal. She lives alone. She was seen by her gynecologist Dr Gus Height who performed a transvaginal US on 12/11/13 which showed a 6.6x3.8x3x7cm uterus with an endometrial thickness of 34mm. The ovaries were not visualized.  Dr Harrington Challenger performed a D&C on 12/18/13 which revealed moderately differentiated (FIGO grade II) endometrioid endometrial adenocarcinoma.  She has a strong family history of colon caner (sister, aunt and uncle). Her last colonoscopy revealed benign polyps.   She is virginal and nulliparous.  Interval History:  Her CT scan reveals: EXAM: CT ABDOMEN AND PELVIS WITH CONTRAST  TECHNIQUE: Multidetector CT imaging of the abdomen and pelvis was performed using the standard protocol following bolus administration of intravenous contrast.  CONTRAST: 77mL OMNIPAQUE IOHEXOL 300 MG/ML SOLN  COMPARISON: 06/02/2013  FINDINGS: Lower chest: Clear lung bases. Mild cardiomegaly, accentuated by a pectus excavatum deformity. Right coronary artery atherosclerosis. No pericardial or pleural effusion.  Hepatobiliary: Normal liver. Cholecystectomy, without biliary ductal dilatation.  Pancreas: Normal, without mass or pancreatic ductal  dilatation.  Spleen: Normal  Adrenals/Urinary Tract: Normal adrenal glands. Mild bilateral renal cortical atrophy. Bilateral multiple renal cysts and too small to characterize lesions. An interpolar left renal lesion measures fluid density centrally but demonstrates minimal increased density in its dependent portion on image 36 of series 2. 1.5 cm. No hydronephrosis. Normal urinary bladder.  Stomach/Bowel: Normal stomach, without wall thickening. A moderate size descending duodenal diverticulum. Extensive colonic diverticulosis with muscular hypertrophy involving the sigmoid. Normal terminal ileum. Otherwise normal appearance of the small bowel.  Vascular/Lymphatic: Atherosclerosis, within the aorta and at the origin of both renal arteries. No retroperitoneal or retrocrural adenopathy. No pelvic adenopathy.  Reproductive: Moderate pelvic floor laxity. Retroverted uterus. Central hypoattenuation with surrounding hyper enhancement involving the uterine fundus. This is retroverted, including on sagittal image 53. No gross extension outside of the uterus.  Other: No evidence of omental or peritoneal disease. No abdominal pelvic fluid.  Musculoskeletal: Degenerative sclerosis involving the symphysis pubis. Right hip osteoarthritis. Lumbar spondylosis.  IMPRESSION: 1. Uterine fundal hyper enhancement and central hypoattenuation, possibly representing the primary endometrial carcinoma. No gross evidence of extrauterine spread and no evidence of nodal metastasis. 2. Pelvic floor laxity. 3. Bilateral renal cortical thinning with presumed cysts and a minimally complex interpolar left renal cyst. 4. Atherosclerosis, including within the coronary arteries.  02/17/14: Surgery: Total robotic hysterectomy bilateral salpingo-oophorectomy, bilateral pelvic and para-aortic lymph node dissection.  Operative findings: Narrow vaginal introitus. Normal appearing uterus, cervix and adnexa. No obvious  evidence of metastatic disease. Normal appearing appendix. Adhesive disease of the omentum to the anterior abdominal wall.  Diagnosis 1. Lymph node, biopsy, para-aortic, right - THREE LYMPH NODES, NEGATIVE FOR METASTATIC CARCINOMA (0/3). 2. Lymph node, biopsy, para-aortic, left - ONE LYMPH NODE, NEGATIVE FOR  METASTATIC CARCINOMA (0/1). 3. Uterus +/- tubes/ovaries, neoplastic, with cervix - INVASIVE ENDOMETRIOID CARCINOMA, FIGO GRADE II, 2.6 CM INVADING INTO THE OUTER HALF OF THE MYOMETRIUM. - NO EVIDENCE OF ANGIOLYMPHATIC INVASION IDENTIFIED. - BILATERAL OVARIES: BENIGN OVARIAN TISSUE WITH ENDOSALPINGIOSIS, NO ATYPIA OR MALIGNANCY. - BILATERAL FALLOPIAN TUBES: BENIGN FALLOPIAN TUBAL TISSUE, NO EVIDENCE OF ATYPIA OR MALIGNANCY. - CERVIX: BENIGN ENDOCERVICAL MUCOSA, NO DYSPLASIA OR MALIGNANCY. 4. Lymph node, biopsy, right pelvic - SIX LYMPH NODES, NEGATIVE FOR METASTATIC CARCINOMA (0/6). 5. Lymph node, biopsy, left pelvic - TWO LYMPH NODES, NEGATIVE FOR METASTATIC CARCINOMA (0/2).  Interval History: I last saw her July 2016.  At that time she was complaining of some occasional episodes of shortness of breath and chest pain. She had a near syncopal episode a few days after I saw her. She was admitted through the emergency room workup is essentially unremarkable. Salt that she was a little dehydrated and had a slight acute renal insufficiency which resolved with fluids. She states that since we saw her in since that episode she's not had any further problems. She's overall feeling and doing quite well. She celebrated her 82th birthday had a wonderful time. She states that she is more comfortable getting all of her follow-up here with [place]. She states is occasionally on the left-sided the vagina she'll feel some "movement". There is no pain associated with it. She primarily feels this when she is lying down. She denies any change in her bowel or bladder habits that she feels that she is emptying her  bladder more often than she used to. There is no dysuria. There are no new medical problems and her family. She continues to do her walking outside or in her house. She states the only other complaint she has that she gets lonely living by herself.  Review of Systems  Constitutional: Denies fever. Skin: No rash Cardiovascular: no chest pain, no shortness of breath, no edema  Pulmonary: No cough  Gastro Intestinal: No nausea, vomiting, constipation, or diarrhea reported. No bright red blood per rectum or change in bowel movement.  Genitourinary: No frequency, urgency, or dysuria.  Denies vaginal bleeding and discharge.  Musculoskeletal: Occ right hip pain Neurologic: No weakness Psychology: Short term memory changes  Current Meds:  Outpatient Encounter Prescriptions as of 05/05/2015  Medication Sig  . ramipril (ALTACE) 5 MG capsule TAKE 1 TABLET BY MOUTH EVERY DAY FOR BLOOD PRESSURE  . acetaminophen (TYLENOL) 325 MG tablet Take 325-650 mg by mouth every 6 (six) hours as needed for mild pain or headache. Reported on 05/05/2015   No facility-administered encounter medications on file as of 05/05/2015.    Allergy:  Allergies  Allergen Reactions  . Benadryl [Diphenhydramine Hcl (Sleep)] Other (See Comments)    Light headed, heart racing.   . Ciprofloxacin Other (See Comments)    Leg pains  . Pravastatin Other (See Comments)    Leg cramps  . Vioxx [Rofecoxib] Nausea Only  . Cephalosporins Rash    Social Hx:   Social History   Social History  . Marital Status: Single    Spouse Name: N/A  . Number of Children: 0  . Years of Education: N/A   Occupational History  . Retired    Social History Main Topics  . Smoking status: Never Smoker   . Smokeless tobacco: Never Used  . Alcohol Use: No  . Drug Use: No  . Sexual Activity: No   Other Topics Concern  . Not on file   Social History  Narrative    Past Surgical Hx:  Past Surgical History  Procedure Laterality Date  .  Tonsillectomy and adenoidectomy  1958  . Lumbar disc surgery Right 1998    L1-L2  . Cholecystectomy, laparoscopic  2002  . Rotator cuff repair Bilateral 2003  . Tendon graft  2003    Bone graft  . Skin biopsy      Four times last 2005 - nose cancer  . Cataract extraction Bilateral 2005  . Cataract extraction w/ intraocular lens  implant, bilateral    . Cholecystectomy    . Wisdom tooth extraction    . Eye surgery    . Colonoscopy    . Hysteroscopy w/d&c N/A 12/18/2013    Procedure: DILATATION AND CURETTAGE /HYSTEROSCOPY;  Surgeon: Gus Height, MD;  Location: [facility];  Service: Gynecology;  Laterality: N/A;  . Robotic assisted total hysterectomy with bilateral salpingo oopherectomy N/A 02/17/2014    Procedure: ROBOTIC ASSISTED TOTAL HYSTERECTOMY WITH BILATERAL SALPINGO OOPHORECTOMY ;  Surgeon: Imagene Gurney A. Alycia Rossetti, MD;  Location: [facility];  Service: Gynecology;  Laterality: N/A;  . Lymph node dissection N/A 02/17/2014    Procedure: LYMPH NODE DISSECTION;  Surgeon: Imagene Gurney A. Alycia Rossetti, MD;  Location: [facility];  Service: Gynecology;  Laterality: N/A;    Past Medical Hx:  Past Medical History  Diagnosis Date  . Diverticulosis of colon   . Gastritis and duodenitis   . Essential hypertension   . Hypercholesterolemia   . TIA (transient ischemic attack)   . Colitis, ischemic ([place])   . CAD (coronary artery disease)     Based on CT imaging 12/2013  . Esophageal stricture   . History of gallstones   . History of kidney stones   . Obesity   . Familial tremor   . Varicose veins   . Arthritis   . Basal cell carcinoma of nose   . Endometrial cancer ([place]) 12/2013  . Baker cyst     a. 04/2014 on right.    Past Gynecological History:  Virginal, nulliparous, menopause in her 75's  No LMP recorded. Patient is postmenopausal.  Family Hx:  Family History  Problem Relation Age of Onset  . Heart disease Sister   . Diabetes Brother     x 4  . Colon cancer Sister   . Crohn's disease      Nephew  . Colon  cancer Maternal Aunt   . Colon cancer Maternal Uncle   . Heart disease Brother   . Diabetes Sister     x 2  . Prostate cancer Brother   . Colon polyps      Nephew  . Leukemia Sister      Vitals:  Blood pressure 124/63, pulse 59, temperature 97.5 F (36.4 C), temperature source Oral, resp. rate 18, height 5\' 1"  (1.549 m), weight 130 lb 6.4 oz (59.149 kg), SpO2 100 %.  Physical Exam:  Well-nourished well-developed female in no acute distress.    Neck: Supple, no lymphadenopathy, no thyromegaly.  Lungs: Clear to auscultation bilateral.  Cardiovascular: Regular rate and rhythm  Abdomen: She has well-healed surgical incisions. Abdomen is soft and nontender.  Extremities: No edema.   Pelvic: Normal female genitalia for age. TSpeculum examination was poorly tolerated by patient. Pap smear submitted.  Unidigital pelvic examination reveals the vaginal cuff to have no lesions. There is no fluctuance, tenderness or nodularity. Rectal confirms  Raevin Wierenga A., MD   05/05/2015, 1:03 PM

## 2015-05-05 NOTE — Patient Instructions (Signed)
We will call you with the results of your pap smear from today.  Plan to follow up in six months or sooner if needed.  Please call in April or May to schedule an appointment for July 2017.

## 2015-05-10 LAB — CYTOLOGY - PAP

## 2015-05-12 ENCOUNTER — Telehealth: Payer: Self-pay

## 2015-05-12 NOTE — Telephone Encounter (Signed)
Orders received from [facility], APNP to contcat the patient and update with PAP results are "normal " from specimen collected during MD visit with Dr Alycia Rossetti on 05/05/2015 . Patient contacted , patient states understanding , denies further questions at this time.

## 2016-08-04 DIAGNOSIS — R55 Syncope and collapse: Secondary | ICD-10-CM | POA: Diagnosis not present

## 2016-08-04 DIAGNOSIS — I129 Hypertensive chronic kidney disease with stage 1 through stage 4 chronic kidney disease, or unspecified chronic kidney disease: Secondary | ICD-10-CM | POA: Diagnosis not present

## 2016-08-04 DIAGNOSIS — N183 Chronic kidney disease, stage 3 (moderate): Secondary | ICD-10-CM | POA: Diagnosis not present

## 2016-08-04 DIAGNOSIS — I10 Essential (primary) hypertension: Secondary | ICD-10-CM | POA: Diagnosis not present

## 2016-08-04 DIAGNOSIS — E784 Other hyperlipidemia: Secondary | ICD-10-CM | POA: Diagnosis not present

## 2016-08-04 DIAGNOSIS — Z6822 Body mass index (BMI) 22.0-22.9, adult: Secondary | ICD-10-CM | POA: Diagnosis not present

## 2016-08-04 DIAGNOSIS — F0390 Unspecified dementia without behavioral disturbance: Secondary | ICD-10-CM | POA: Diagnosis not present

## 2016-08-04 DIAGNOSIS — D692 Other nonthrombocytopenic purpura: Secondary | ICD-10-CM | POA: Diagnosis not present

## 2016-08-04 DIAGNOSIS — I6529 Occlusion and stenosis of unspecified carotid artery: Secondary | ICD-10-CM | POA: Diagnosis not present

## 2016-08-04 DIAGNOSIS — R002 Palpitations: Secondary | ICD-10-CM | POA: Diagnosis not present

## 2016-08-04 DIAGNOSIS — G4700 Insomnia, unspecified: Secondary | ICD-10-CM | POA: Diagnosis not present

## 2016-08-04 DIAGNOSIS — R079 Chest pain, unspecified: Secondary | ICD-10-CM | POA: Diagnosis not present

## 2016-08-08 DIAGNOSIS — R002 Palpitations: Secondary | ICD-10-CM | POA: Diagnosis not present

## 2016-08-08 DIAGNOSIS — I10 Essential (primary) hypertension: Secondary | ICD-10-CM | POA: Diagnosis not present

## 2016-08-08 DIAGNOSIS — R55 Syncope and collapse: Secondary | ICD-10-CM | POA: Diagnosis not present

## 2016-08-08 DIAGNOSIS — I2510 Atherosclerotic heart disease of native coronary artery without angina pectoris: Secondary | ICD-10-CM | POA: Diagnosis not present

## 2016-08-08 DIAGNOSIS — R079 Chest pain, unspecified: Secondary | ICD-10-CM | POA: Diagnosis not present

## 2016-08-08 DIAGNOSIS — N183 Chronic kidney disease, stage 3 (moderate): Secondary | ICD-10-CM | POA: Diagnosis not present

## 2016-08-08 DIAGNOSIS — E785 Hyperlipidemia, unspecified: Secondary | ICD-10-CM | POA: Diagnosis not present

## 2016-08-23 ENCOUNTER — Ambulatory Visit: Payer: PPO | Attending: Gynecologic Oncology | Admitting: Gynecologic Oncology

## 2016-08-23 ENCOUNTER — Encounter: Payer: Self-pay | Admitting: Gynecologic Oncology

## 2016-08-23 VITALS — BP 129/59 | HR 58 | Temp 98.4°F | Resp 20 | Wt 124.0 lb

## 2016-08-23 DIAGNOSIS — Z85828 Personal history of other malignant neoplasm of skin: Secondary | ICD-10-CM | POA: Diagnosis not present

## 2016-08-23 DIAGNOSIS — I10 Essential (primary) hypertension: Secondary | ICD-10-CM | POA: Diagnosis not present

## 2016-08-23 DIAGNOSIS — Z90710 Acquired absence of both cervix and uterus: Secondary | ICD-10-CM | POA: Insufficient documentation

## 2016-08-23 DIAGNOSIS — Z9049 Acquired absence of other specified parts of digestive tract: Secondary | ICD-10-CM | POA: Diagnosis not present

## 2016-08-23 DIAGNOSIS — I2510 Atherosclerotic heart disease of native coronary artery without angina pectoris: Secondary | ICD-10-CM | POA: Diagnosis not present

## 2016-08-23 DIAGNOSIS — C541 Malignant neoplasm of endometrium: Secondary | ICD-10-CM

## 2016-08-23 DIAGNOSIS — Z9889 Other specified postprocedural states: Secondary | ICD-10-CM | POA: Diagnosis not present

## 2016-08-23 DIAGNOSIS — Z808 Family history of malignant neoplasm of other organs or systems: Secondary | ICD-10-CM | POA: Insufficient documentation

## 2016-08-23 DIAGNOSIS — E669 Obesity, unspecified: Secondary | ICD-10-CM | POA: Insufficient documentation

## 2016-08-23 DIAGNOSIS — Z8249 Family history of ischemic heart disease and other diseases of the circulatory system: Secondary | ICD-10-CM | POA: Diagnosis not present

## 2016-08-23 DIAGNOSIS — Z833 Family history of diabetes mellitus: Secondary | ICD-10-CM | POA: Insufficient documentation

## 2016-08-23 DIAGNOSIS — Z8542 Personal history of malignant neoplasm of other parts of uterus: Secondary | ICD-10-CM | POA: Diagnosis not present

## 2016-08-23 DIAGNOSIS — Z87442 Personal history of urinary calculi: Secondary | ICD-10-CM | POA: Diagnosis not present

## 2016-08-23 NOTE — Progress Notes (Signed)
Consult Note: Gyn-Onc  CC: Dr Harle Battiest Chief Complaint  Patient presents with  . Follow-up    Assessment/Plan:  [name]  is a 83 y.o. year old Nulliparous, virginal woman with grade 2 endometrioid endometrial adenocarcinoma on D&C.  Her final pathology is a stage IA grade 2 endometrioid adenocarcinoma with 66% myometrial invasion. 0 out of 12 lymph nodes were involved and there was no lymphovascular space involvement. Per GOG 99 she would benefit from vaginal cuff brachytherapy. She ultimately declined radiation therapy after seeing Dr. Sondra Come. She is status post definitive surgery and November 2015 and has no evidence recurrent disease. She'll return to see [place] in 6 months.   HPI: [name] is a 83 year old very healthy woman with a 4-6 month history of abnormal bloody vaginal discharge. She is nulliparous and virginal. She lives alone. She was seen by her gynecologist Dr Gus Height who performed a transvaginal US on 12/11/13 which showed a 6.6x3.8x3x7cm uterus with an endometrial thickness of 7mm. The ovaries were not visualized.  Dr Harrington Challenger performed a D&C on 12/18/13 which revealed moderately differentiated (FIGO grade II) endometrioid endometrial adenocarcinoma.  She has a strong family history of colon caner (sister, aunt and uncle). Her last colonoscopy revealed benign polyps.   She is virginal and nulliparous.   Her CT scan reveals: EXAM: CT ABDOMEN AND PELVIS WITH CONTRAST  TECHNIQUE: Multidetector CT imaging of the abdomen and pelvis was performed using the standard protocol following bolus administration of intravenous contrast.  CONTRAST: 64mL OMNIPAQUE IOHEXOL 300 MG/ML SOLN  COMPARISON: 06/02/2013  FINDINGS: Lower chest: Clear lung bases. Mild cardiomegaly, accentuated by a pectus excavatum deformity. Right coronary artery atherosclerosis. No pericardial or pleural effusion.  Hepatobiliary: Normal liver. Cholecystectomy, without biliary ductal  dilatation.  Pancreas: Normal, without mass or pancreatic ductal dilatation.  Spleen: Normal  Adrenals/Urinary Tract: Normal adrenal glands. Mild bilateral renal cortical atrophy. Bilateral multiple renal cysts and too small to characterize lesions. An interpolar left renal lesion measures fluid density centrally but demonstrates minimal increased density in its dependent portion on image 36 of series 2. 1.5 cm. No hydronephrosis. Normal urinary bladder.  Stomach/Bowel: Normal stomach, without wall thickening. A moderate size descending duodenal diverticulum. Extensive colonic diverticulosis with muscular hypertrophy involving the sigmoid. Normal terminal ileum. Otherwise normal appearance of the small bowel.  Vascular/Lymphatic: Atherosclerosis, within the aorta and at the origin of both renal arteries. No retroperitoneal or retrocrural adenopathy. No pelvic adenopathy.  Reproductive: Moderate pelvic floor laxity. Retroverted uterus. Central hypoattenuation with surrounding hyper enhancement involving the uterine fundus. This is retroverted, including on sagittal image 53. No gross extension outside of the uterus.  Other: No evidence of omental or peritoneal disease. No abdominal pelvic fluid.  Musculoskeletal: Degenerative sclerosis involving the symphysis pubis. Right hip osteoarthritis. Lumbar spondylosis.  IMPRESSION: 1. Uterine fundal hyper enhancement and central hypoattenuation, possibly representing the primary endometrial carcinoma. No gross evidence of extrauterine spread and no evidence of nodal metastasis. 2. Pelvic floor laxity. 3. Bilateral renal cortical thinning with presumed cysts and a minimally complex interpolar left renal cyst. 4. Atherosclerosis, including within the coronary arteries.  02/17/14: Surgery: Total robotic hysterectomy bilateral salpingo-oophorectomy, bilateral pelvic and para-aortic lymph node dissection.  Operative findings: Narrow vaginal  introitus. Normal appearing uterus, cervix and adnexa. No obvious evidence of metastatic disease. Normal appearing appendix. Adhesive disease of the omentum to the anterior abdominal wall.  Diagnosis 1. Lymph node, biopsy, para-aortic, right - THREE LYMPH NODES, NEGATIVE FOR METASTATIC CARCINOMA (  0/3). 2. Lymph node, biopsy, para-aortic, left - ONE LYMPH NODE, NEGATIVE FOR METASTATIC CARCINOMA (0/1). 3. Uterus +/- tubes/ovaries, neoplastic, with cervix - INVASIVE ENDOMETRIOID CARCINOMA, FIGO GRADE II, 2.6 CM INVADING INTO THE OUTER HALF OF THE MYOMETRIUM. - NO EVIDENCE OF ANGIOLYMPHATIC INVASION IDENTIFIED. - BILATERAL OVARIES: BENIGN OVARIAN TISSUE WITH ENDOSALPINGIOSIS, NO ATYPIA OR MALIGNANCY. - BILATERAL FALLOPIAN TUBES: BENIGN FALLOPIAN TUBAL TISSUE, NO EVIDENCE OF ATYPIA OR MALIGNANCY. - CERVIX: BENIGN ENDOCERVICAL MUCOSA, NO DYSPLASIA OR MALIGNANCY. 4. Lymph node, biopsy, right pelvic - SIX LYMPH NODES, NEGATIVE FOR METASTATIC CARCINOMA (0/6). 5. Lymph node, biopsy, left pelvic - TWO LYMPH NODES, NEGATIVE FOR METASTATIC CARCINOMA (0/2).  Interval History: I last saw her January 2017.  Her Pap smear at that time was negative. She comes in today for follow-up. She comes in today for follow-up. She has not seen anybody since we last saw her. She's overall doing quite well. Her niece is worried about her weight loss. Her weight is 124 pounds today. It was 130 in January 2016 she's not losing weight at unremarkable speak. When she eats she's well she just forgets [name] she lives by herself. She typically goes out to dinner with her brother every evening. She does have some bruises on her arms she's not exactly sure how she gets those but does bump into things. She continues to walk every day down her driveway 8-18 times depending on the weather. She did see a cardiologist that she was somewhat dizzy and would occasionally have the smell of some almonds. She almost fell once. They did an EKG that  was normal but she refused well Holter monitor. She states she's better now that she is no longer eating snacks at night.  Review of Systems  Constitutional: Denies fever. Skin: No rash Cardiovascular: No chest pain, no shortness of breath, no edema  Pulmonary: No cough  Gastro Intestinal: No nausea, vomiting, constipation, or diarrhea reported.  Genitourinary: Denies vaginal bleeding and discharge.  Musculoskeletal: Occ right hip pain Neurologic: No weakness Psychology: Short term memory changes, getting worse per her family  Current Meds:  Outpatient Encounter Prescriptions as of 08/23/2016  Medication Sig  . acetaminophen (TYLENOL) 325 MG tablet Take 325-650 mg by mouth every 6 (six) hours as needed for mild pain or headache. Reported on 05/05/2015  . ramipril (ALTACE) 5 MG capsule TAKE 1 TABLET BY MOUTH EVERY DAY FOR BLOOD PRESSURE   No facility-administered encounter medications on file as of 08/23/2016.     Allergy:  Allergies  Allergen Reactions  . Benadryl [Diphenhydramine Hcl (Sleep)] Other (See Comments)    Light headed, heart racing.   . Ciprofloxacin Other (See Comments)    Leg pains  . Pravastatin Other (See Comments)    Leg cramps  . Vioxx [Rofecoxib] Nausea Only  . Cephalosporins Rash    Social Hx:   Social History   Social History  . Marital status: Single    Spouse name: N/A  . Number of children: 0  . Years of education: N/A   Occupational History  . Retired    Social History Main Topics  . Smoking status: Never Smoker  . Smokeless tobacco: Never Used  . Alcohol use No  . Drug use: No  . Sexual activity: No   Other Topics Concern  . Not on file   Social History Narrative  . No narrative on file    Past Surgical Hx:  Past Surgical History:  Procedure Laterality Date  . CATARACT EXTRACTION Bilateral 2005  .  CATARACT EXTRACTION W/ INTRAOCULAR LENS  IMPLANT, BILATERAL    . CHOLECYSTECTOMY    . CHOLECYSTECTOMY, LAPAROSCOPIC  2002  .  COLONOSCOPY    . EYE SURGERY    . HYSTEROSCOPY W/D&C N/A 12/18/2013   Procedure: DILATATION AND CURETTAGE /HYSTEROSCOPY;  Surgeon: Gus Height, MD;  Location: [facility];  Service: Gynecology;  Laterality: N/A;  . LUMBAR [place] Right 1998   L1-L2  . LYMPH NODE DISSECTION N/A 02/17/2014   Procedure: LYMPH NODE DISSECTION;  Surgeon: Imagene Gurney A. Alycia Rossetti, MD;  Location: [facility];  Service: Gynecology;  Laterality: N/A;  . ROBOTIC ASSISTED TOTAL HYSTERECTOMY WITH BILATERAL SALPINGO OOPHERECTOMY N/A 02/17/2014   Procedure: ROBOTIC ASSISTED TOTAL HYSTERECTOMY WITH BILATERAL SALPINGO OOPHORECTOMY ;  Surgeon: Imagene Gurney A. Alycia Rossetti, MD;  Location: [facility];  Service: Gynecology;  Laterality: N/A;  . ROTATOR CUFF REPAIR Bilateral 2003  . SKIN BIOPSY     Four times last 2005 - nose cancer  . TENDON GRAFT  2003   Bone graft  . TONSILLECTOMY AND ADENOIDECTOMY  1958  . WISDOM TOOTH EXTRACTION      Past Medical Hx:  Past Medical History:  Diagnosis Date  . Arthritis   . Baker cyst    a. 04/2014 on right.  . Basal cell carcinoma of nose   . CAD (coronary artery disease)    Based on CT imaging 12/2013  . Colitis, ischemic ([place])   . Diverticulosis of colon   . Endometrial cancer ([place]) 12/2013  . Esophageal stricture   . Essential hypertension   . Familial tremor   . Gastritis and duodenitis   . History of gallstones   . History of kidney stones   . Hypercholesterolemia   . Obesity   . TIA (transient ischemic attack)   . Varicose veins     Past Gynecological History:  Virginal, nulliparous, menopause in her 67's  No LMP recorded. Patient is postmenopausal.  Family Hx:  Family History  Problem Relation Age of Onset  . Heart disease Sister   . Diabetes Brother        x 4  . Colon cancer Sister   . Crohn's disease Unknown        Nephew  . Colon cancer Maternal Aunt   . Colon cancer Maternal Uncle   . Heart disease Brother   . Diabetes Sister        x 2  . Prostate cancer Brother   . Colon polyps  Unknown        Nephew  . Leukemia Sister      Vitals:  Blood pressure (!) 129/59, pulse (!) 58, temperature 98.4 F (36.9 C), resp. rate 20, weight 124 lb (56.2 kg).  Physical Exam:  Well-nourished well-developed female in no acute distress.    Neck: Supple, no lymphadenopathy, no thyromegaly.  Lungs: Clear to auscultation bilateral.  Cardiovascular: Regular rate and rhythm  Abdomen: She has well-healed surgical incisions. Abdomen is soft and nontender. There are no palpable masses. There is no fluid wave. There are no incisional hernias.  Extremities: No edema.   Pelvic: Normal female genitalia for age. Speculum examination was poorly tolerated by patient. Unidigital pelvic examination reveals the vaginal cuff to have no lesions. There is no fluctuance, tenderness or nodularity. Rectal confirms  [name] A., MD   08/23/2016, 9:33 AM

## 2016-08-23 NOTE — Patient Instructions (Signed)
Plan to follow up in six months or sooner if needed.  Please call for any needs.

## 2017-01-31 ENCOUNTER — Ambulatory Visit: Payer: PPO | Attending: Gynecologic Oncology | Admitting: Gynecologic Oncology

## 2017-01-31 ENCOUNTER — Encounter: Payer: Self-pay | Admitting: Gynecologic Oncology

## 2017-01-31 VITALS — BP 90/56 | HR 63 | Temp 98.0°F | Resp 18 | Ht 60.55 in | Wt 118.4 lb

## 2017-01-31 DIAGNOSIS — Z79899 Other long term (current) drug therapy: Secondary | ICD-10-CM | POA: Diagnosis not present

## 2017-01-31 DIAGNOSIS — I2510 Atherosclerotic heart disease of native coronary artery without angina pectoris: Secondary | ICD-10-CM | POA: Insufficient documentation

## 2017-01-31 DIAGNOSIS — Z8249 Family history of ischemic heart disease and other diseases of the circulatory system: Secondary | ICD-10-CM | POA: Insufficient documentation

## 2017-01-31 DIAGNOSIS — Z8542 Personal history of malignant neoplasm of other parts of uterus: Secondary | ICD-10-CM

## 2017-01-31 DIAGNOSIS — Z85828 Personal history of other malignant neoplasm of skin: Secondary | ICD-10-CM | POA: Diagnosis not present

## 2017-01-31 DIAGNOSIS — I951 Orthostatic hypotension: Secondary | ICD-10-CM | POA: Diagnosis not present

## 2017-01-31 DIAGNOSIS — Z800 Family history of malignant neoplasm of digestive organs: Secondary | ICD-10-CM | POA: Insufficient documentation

## 2017-01-31 DIAGNOSIS — I10 Essential (primary) hypertension: Secondary | ICD-10-CM | POA: Insufficient documentation

## 2017-01-31 DIAGNOSIS — Z9181 History of falling: Secondary | ICD-10-CM | POA: Diagnosis not present

## 2017-01-31 DIAGNOSIS — M25552 Pain in left hip: Secondary | ICD-10-CM | POA: Insufficient documentation

## 2017-01-31 DIAGNOSIS — Z9841 Cataract extraction status, right eye: Secondary | ICD-10-CM | POA: Diagnosis not present

## 2017-01-31 DIAGNOSIS — N898 Other specified noninflammatory disorders of vagina: Secondary | ICD-10-CM | POA: Insufficient documentation

## 2017-01-31 DIAGNOSIS — C541 Malignant neoplasm of endometrium: Secondary | ICD-10-CM

## 2017-01-31 DIAGNOSIS — E7800 Pure hypercholesterolemia, unspecified: Secondary | ICD-10-CM | POA: Insufficient documentation

## 2017-01-31 DIAGNOSIS — Z8673 Personal history of transient ischemic attack (TIA), and cerebral infarction without residual deficits: Secondary | ICD-10-CM | POA: Diagnosis not present

## 2017-01-31 DIAGNOSIS — Z8371 Family history of colonic polyps: Secondary | ICD-10-CM | POA: Diagnosis not present

## 2017-01-31 DIAGNOSIS — Z9049 Acquired absence of other specified parts of digestive tract: Secondary | ICD-10-CM | POA: Insufficient documentation

## 2017-01-31 DIAGNOSIS — Z9889 Other specified postprocedural states: Secondary | ICD-10-CM | POA: Diagnosis not present

## 2017-01-31 DIAGNOSIS — E669 Obesity, unspecified: Secondary | ICD-10-CM | POA: Insufficient documentation

## 2017-01-31 DIAGNOSIS — Z833 Family history of diabetes mellitus: Secondary | ICD-10-CM | POA: Diagnosis not present

## 2017-01-31 DIAGNOSIS — G250 Essential tremor: Secondary | ICD-10-CM | POA: Insufficient documentation

## 2017-01-31 DIAGNOSIS — Z7982 Long term (current) use of aspirin: Secondary | ICD-10-CM | POA: Insufficient documentation

## 2017-01-31 DIAGNOSIS — Z90710 Acquired absence of both cervix and uterus: Secondary | ICD-10-CM | POA: Insufficient documentation

## 2017-01-31 DIAGNOSIS — Z806 Family history of leukemia: Secondary | ICD-10-CM | POA: Diagnosis not present

## 2017-01-31 DIAGNOSIS — W19XXXA Unspecified fall, initial encounter: Secondary | ICD-10-CM | POA: Diagnosis not present

## 2017-01-31 DIAGNOSIS — Z87442 Personal history of urinary calculi: Secondary | ICD-10-CM | POA: Diagnosis not present

## 2017-01-31 DIAGNOSIS — Z888 Allergy status to other drugs, medicaments and biological substances status: Secondary | ICD-10-CM | POA: Diagnosis not present

## 2017-01-31 DIAGNOSIS — Z6822 Body mass index (BMI) 22.0-22.9, adult: Secondary | ICD-10-CM | POA: Diagnosis not present

## 2017-01-31 DIAGNOSIS — Z8042 Family history of malignant neoplasm of prostate: Secondary | ICD-10-CM | POA: Insufficient documentation

## 2017-01-31 DIAGNOSIS — R42 Dizziness and giddiness: Secondary | ICD-10-CM | POA: Diagnosis not present

## 2017-01-31 NOTE — Patient Instructions (Signed)
Plan to follow up in six months or sooner if needed.  Please call our office at [phone] to schedule in Jan or Feb 2019

## 2017-01-31 NOTE — Progress Notes (Signed)
Consult Note: Gyn-Onc  CC: Dr Harle Battiest Chief Complaint  Patient presents with  . Endometrial ca [facility])    Assessment/Plan:  [name]  is a 83 y.o. year old Nulliparous, virginal woman with grade 2 endometrioid endometrial adenocarcinoma on D&C.  Her final pathology is a stage IA grade 2 endometrioid adenocarcinoma with 66% myometrial invasion. 0 out of 12 lymph nodes were involved and there was no lymphovascular space involvement. Per GOG 99 she would benefit from vaginal cuff brachytherapy. She ultimately declined radiation therapy after seeing Dr. Sondra Come. She is status post definitive surgery and November 2015 and has no evidence recurrent disease. She'll return to see [place] in 6 months.  I discussed with the patient and her niece that they may want to consider discussing the recent fall with her primary care physician to see if she might benefit from a physical therapy evaluation for balance. It seems that there is a slippery floor into the kitchen and they may need to think about getting that for replace should she suffer another fall.   HPI: [name] is a 83 year old very healthy woman with a 4-6 month history of abnormal bloody vaginal discharge. She is nulliparous and virginal. She lives alone. She was seen by her gynecologist Dr Gus Height who performed a transvaginal US on 12/11/13 which showed a 6.6x3.8x3x7cm uterus with an endometrial thickness of 18mm. The ovaries were not visualized.  Dr Harrington Challenger performed a D&C on 12/18/13 which revealed moderately differentiated (FIGO grade II) endometrioid endometrial adenocarcinoma.  She has a strong family history of colon caner (sister, aunt and uncle). Her last colonoscopy revealed benign polyps.   She is virginal and nulliparous.   Her CT scan reveals: EXAM: CT ABDOMEN AND PELVIS WITH CONTRAST  TECHNIQUE: Multidetector CT imaging of the abdomen and pelvis was performed using the standard protocol following bolus administration of  intravenous contrast.  CONTRAST: 68mL OMNIPAQUE IOHEXOL 300 MG/ML SOLN  COMPARISON: 06/02/2013  FINDINGS: Lower chest: Clear lung bases. Mild cardiomegaly, accentuated by a pectus excavatum deformity. Right coronary artery atherosclerosis. No pericardial or pleural effusion.  Hepatobiliary: Normal liver. Cholecystectomy, without biliary ductal dilatation.  Pancreas: Normal, without mass or pancreatic ductal dilatation.  Spleen: Normal  Adrenals/Urinary Tract: Normal adrenal glands. Mild bilateral renal cortical atrophy. Bilateral multiple renal cysts and too small to characterize lesions. An interpolar left renal lesion measures fluid density centrally but demonstrates minimal increased density in its dependent portion on image 36 of series 2. 1.5 cm. No hydronephrosis. Normal urinary bladder.  Stomach/Bowel: Normal stomach, without wall thickening. A moderate size descending duodenal diverticulum. Extensive colonic diverticulosis with muscular hypertrophy involving the sigmoid. Normal terminal ileum. Otherwise normal appearance of the small bowel.  Vascular/Lymphatic: Atherosclerosis, within the aorta and at the origin of both renal arteries. No retroperitoneal or retrocrural adenopathy. No pelvic adenopathy.  Reproductive: Moderate pelvic floor laxity. Retroverted uterus. Central hypoattenuation with surrounding hyper enhancement involving the uterine fundus. This is retroverted, including on sagittal image 53. No gross extension outside of the uterus.  Other: No evidence of omental or peritoneal disease. No abdominal pelvic fluid.  Musculoskeletal: Degenerative sclerosis involving the symphysis pubis. Right hip osteoarthritis. Lumbar spondylosis.  IMPRESSION: 1. Uterine fundal hyper enhancement and central hypoattenuation, possibly representing the primary endometrial carcinoma. No gross evidence of extrauterine spread and no evidence of nodal metastasis. 2. Pelvic  floor laxity. 3. Bilateral renal cortical thinning with presumed cysts and a minimally complex interpolar left renal cyst. 4. Atherosclerosis, including  within the coronary arteries.  02/17/14: Surgery: Total robotic hysterectomy bilateral salpingo-oophorectomy, bilateral pelvic and para-aortic lymph node dissection.  Operative findings: Narrow vaginal introitus. Normal appearing uterus, cervix and adnexa. No obvious evidence of metastatic disease. Normal appearing appendix. Adhesive disease of the omentum to the anterior abdominal wall.  Diagnosis 1. Lymph node, biopsy, para-aortic, right - THREE LYMPH NODES, NEGATIVE FOR METASTATIC CARCINOMA (0/3). 2. Lymph node, biopsy, para-aortic, left - ONE LYMPH NODE, NEGATIVE FOR METASTATIC CARCINOMA (0/1). 3. Uterus +/- tubes/ovaries, neoplastic, with cervix - INVASIVE ENDOMETRIOID CARCINOMA, FIGO GRADE II, 2.6 CM INVADING INTO THE OUTER HALF OF THE MYOMETRIUM. - NO EVIDENCE OF ANGIOLYMPHATIC INVASION IDENTIFIED. - BILATERAL OVARIES: BENIGN OVARIAN TISSUE WITH ENDOSALPINGIOSIS, NO ATYPIA OR MALIGNANCY. - BILATERAL FALLOPIAN TUBES: BENIGN FALLOPIAN TUBAL TISSUE, NO EVIDENCE OF ATYPIA OR MALIGNANCY. - CERVIX: BENIGN ENDOCERVICAL MUCOSA, NO DYSPLASIA OR MALIGNANCY. 4. Lymph node, biopsy, right pelvic - SIX LYMPH NODES, NEGATIVE FOR METASTATIC CARCINOMA (0/6). 5. Lymph node, biopsy, left pelvic - TWO LYMPH NODES, NEGATIVE FOR METASTATIC CARCINOMA (0/2).  Interval History: I last saw her May 2018.  Her Pap smear 04/2015 was negative. She comes in today for follow-up. She's overall doing quite well. Her blood pressure was low on manual repeat was 90/56. She did not eat or drink anything this morning she did have dinner occasionally murmur which she ate. She does go out to dinner every evening with her brother. She is suffering from progressive memory loss and comes in with her niece today. She did suffer a fall she believes on Sunday. She slipped on the  floor to the kitchen. They did not feel she needed to go to the emergency room. There is no head trauma. She had a little bit of left hip pain at the time but that is better today. She denies any other complaints. She states that occasionally when she feels that excited she feels a little shortness in her chest but no chest pain or shortness of breath. She continues to live alone and lives independently.  Review of Systems  Constitutional: Denies fever. Skin: No rash Cardiovascular: No chest pain, no shortness of breath, no edema  Pulmonary: No cough  Gastro Intestinal: No nausea, vomiting, constipation, or diarrhea reported.  Genitourinary: Denies vaginal bleeding and discharge.  Musculoskeletal: Left hip pain s/p fall Neurologic: No weakness Psychology: Short term memory changes, getting worse per her family and patient agrees  Current Meds:  Outpatient Encounter Prescriptions as of 01/31/2017  Medication Sig  . aspirin EC 81 MG tablet Take 81 mg by mouth daily.  . ramipril (ALTACE) 5 MG capsule TAKE 1 TABLET BY MOUTH EVERY DAY FOR BLOOD PRESSURE  . [DISCONTINUED] acetaminophen (TYLENOL) 325 MG tablet Take 325-650 mg by mouth every 6 (six) hours as needed for mild pain or headache. Reported on 05/05/2015   No facility-administered encounter medications on file as of 01/31/2017.     Allergy:  Allergies  Allergen Reactions  . Benadryl [Diphenhydramine Hcl (Sleep)] Other (See Comments)    Light headed, heart racing.   . Ciprofloxacin Other (See Comments)    Leg pains  . Pravastatin Other (See Comments)    Leg cramps  . Vioxx [Rofecoxib] Nausea Only  . Cephalosporins Rash    Social Hx:   Social History   Social History  . Marital status: Single    Spouse name: N/A  . Number of children: 0  . Years of education: N/A   Occupational History  . Retired    Science writer  History Main Topics  . Smoking status: Never Smoker  . Smokeless tobacco: Never Used  . Alcohol use No  . Drug  use: No  . Sexual activity: No   Other Topics Concern  . Not on file   Social History Narrative  . No narrative on file    Past Surgical Hx:  Past Surgical History:  Procedure Laterality Date  . CATARACT EXTRACTION Bilateral 2005  . CATARACT EXTRACTION W/ INTRAOCULAR LENS  IMPLANT, BILATERAL    . CHOLECYSTECTOMY    . CHOLECYSTECTOMY, LAPAROSCOPIC  2002  . COLONOSCOPY    . EYE SURGERY    . HYSTEROSCOPY W/D&C N/A 12/18/2013   Procedure: DILATATION AND CURETTAGE /HYSTEROSCOPY;  Surgeon: Gus Height, MD;  Location: [facility];  Service: Gynecology;  Laterality: N/A;  . LUMBAR [place] Right 1998   L1-L2  . LYMPH NODE DISSECTION N/A 02/17/2014   Procedure: LYMPH NODE DISSECTION;  Surgeon: Imagene Gurney A. Alycia Rossetti, MD;  Location: [facility];  Service: Gynecology;  Laterality: N/A;  . ROBOTIC ASSISTED TOTAL HYSTERECTOMY WITH BILATERAL SALPINGO OOPHERECTOMY N/A 02/17/2014   Procedure: ROBOTIC ASSISTED TOTAL HYSTERECTOMY WITH BILATERAL SALPINGO OOPHORECTOMY ;  Surgeon: Imagene Gurney A. Alycia Rossetti, MD;  Location: [facility];  Service: Gynecology;  Laterality: N/A;  . ROTATOR CUFF REPAIR Bilateral 2003  . SKIN BIOPSY     Four times last 2005 - nose cancer  . TENDON GRAFT  2003   Bone graft  . TONSILLECTOMY AND ADENOIDECTOMY  1958  . WISDOM TOOTH EXTRACTION      Past Medical Hx:  Past Medical History:  Diagnosis Date  . Arthritis   . Baker cyst    a. 04/2014 on right.  . Basal cell carcinoma of nose   . CAD (coronary artery disease)    Based on CT imaging 12/2013  . Colitis, ischemic ([place])   . Diverticulosis of colon   . Endometrial cancer ([place]) 12/2013  . Esophageal stricture   . Essential hypertension   . Familial tremor   . Gastritis and duodenitis   . History of gallstones   . History of kidney stones   . Hypercholesterolemia   . Obesity   . TIA (transient ischemic attack)   . Varicose veins     Past Gynecological History:  Virginal, nulliparous, menopause in her 34's  No LMP recorded. Patient is  postmenopausal.  Family Hx:  Family History  Problem Relation Age of Onset  . Heart disease Sister   . Diabetes Brother        x 4  . Colon cancer Sister   . Crohn's disease Unknown        Nephew  . Colon cancer Maternal Aunt   . Colon cancer Maternal Uncle   . Heart disease Brother   . Diabetes Sister        x 2  . Prostate cancer Brother   . Colon polyps Unknown        Nephew  . Leukemia Sister      Vitals:  Blood pressure (!) 97/35, pulse 63, temperature 98 F (36.7 C), temperature source Oral, resp. rate 18, height 5' 0.55" (1.538 m), weight 118 lb 6.4 oz (53.7 kg), SpO2 99 %. repeat blood pressure 90/56  Physical Exam:  Well-nourished well-developed female in no acute distress.    Neck: Supple, no lymphadenopathy, no thyromegaly.  Lungs: Clear to auscultation bilateral.  Cardiovascular: Regular rate and rhythm  Abdomen: She has well-healed surgical incisions. Abdomen is soft and nontender. There are no palpable masses.  There is no fluid wave. There are no incisional hernias.  Extremities: No edema. No bruising, hips atraumatic  Pelvic: Normal female genitalia for age. Speculum examination was poorly tolerated by patient. Unidigital pelvic examination reveals the vaginal cuff to have no lesions. There is no fluctuance, tenderness or nodularity. Rectal confirms  Elasia Furnish A., MD   01/31/2017, 8:37 AM

## 2017-06-12 ENCOUNTER — Telehealth: Payer: Self-pay | Admitting: *Deleted

## 2017-06-12 NOTE — Telephone Encounter (Signed)
Called and spoke with Ivin Booty regarding the follow up appt. [name] that Dr. Alycia Rossetti is no longer seeing patient's in the [facility] office. Advised that they can either follow Dr. Alycia Rossetti to [facility] or stay here and see one of her partners. They will stay in [facility], appt scheduled with Dr. Gerarda Fraction.

## 2017-07-25 ENCOUNTER — Emergency Department (HOSPITAL_COMMUNITY)
Admission: EM | Admit: 2017-07-25 | Discharge: 2017-07-25 | Disposition: A | Discharge status: Home / Self Care | Payer: PPO | Attending: Emergency Medicine | Admitting: Emergency Medicine

## 2017-07-25 ENCOUNTER — Other Ambulatory Visit: Payer: Self-pay

## 2017-07-25 ENCOUNTER — Encounter (HOSPITAL_COMMUNITY): Payer: Self-pay | Admitting: Emergency Medicine

## 2017-07-25 ENCOUNTER — Emergency Department (HOSPITAL_COMMUNITY): Payer: PPO

## 2017-07-25 DIAGNOSIS — I10 Essential (primary) hypertension: Secondary | ICD-10-CM | POA: Diagnosis not present

## 2017-07-25 DIAGNOSIS — Y929 Unspecified place or not applicable: Secondary | ICD-10-CM | POA: Insufficient documentation

## 2017-07-25 DIAGNOSIS — Z79899 Other long term (current) drug therapy: Secondary | ICD-10-CM | POA: Insufficient documentation

## 2017-07-25 DIAGNOSIS — S8001XA Contusion of right knee, initial encounter: Secondary | ICD-10-CM

## 2017-07-25 DIAGNOSIS — Y999 Unspecified external cause status: Secondary | ICD-10-CM | POA: Insufficient documentation

## 2017-07-25 DIAGNOSIS — Y9301 Activity, walking, marching and hiking: Secondary | ICD-10-CM | POA: Diagnosis not present

## 2017-07-25 DIAGNOSIS — I2510 Atherosclerotic heart disease of native coronary artery without angina pectoris: Secondary | ICD-10-CM | POA: Diagnosis not present

## 2017-07-25 DIAGNOSIS — S8991XA Unspecified injury of right lower leg, initial encounter: Secondary | ICD-10-CM | POA: Diagnosis not present

## 2017-07-25 DIAGNOSIS — X501XXA Overexertion from prolonged static or awkward postures, initial encounter: Secondary | ICD-10-CM | POA: Insufficient documentation

## 2017-07-25 DIAGNOSIS — M7989 Other specified soft tissue disorders: Secondary | ICD-10-CM | POA: Diagnosis not present

## 2017-07-25 DIAGNOSIS — Z7982 Long term (current) use of aspirin: Secondary | ICD-10-CM | POA: Insufficient documentation

## 2017-07-25 DIAGNOSIS — M25561 Pain in right knee: Secondary | ICD-10-CM | POA: Diagnosis not present

## 2017-07-25 NOTE — ED Provider Notes (Signed)
[facility] DEPT Provider Note   CSN: [id number] Arrival date & time: 07/25/17  1314     History   Chief Complaint Chief Complaint  Patient presents with  . Knee Pain    HPI [name] is a 84 y.o. female who appears younger than her stated age, presents to the ED with knee pain s/p fall yesterday. The pain is located in the right knee. Patient states she was walking on her deck and now sure if she tripped or twisted her knee causing her to fall. She reports catching the fall with her hands but did hit her right knee on the floor.   HPI  Past Medical History:  Diagnosis Date  . Arthritis   . Baker cyst    a. 04/2014 on right.  . Basal cell carcinoma of nose   . CAD (coronary artery disease)    Based on CT imaging 12/2013  . Colitis, ischemic ([place])   . Diverticulosis of colon   . Endometrial cancer ([place]) 12/2013  . Esophageal stricture   . Essential hypertension   . Familial tremor   . Gastritis and duodenitis   . History of gallstones   . History of kidney stones   . Hypercholesterolemia   . Obesity   . TIA (transient ischemic attack)   . Varicose veins     Patient Active Problem List   Diagnosis Date Noted  . Bradycardia 10/18/2014  . AKI (acute kidney injury) ([place]) 10/17/2014  . Essential hypertension 10/17/2014  . Syncope and collapse 10/16/2014  . Endometrial cancer determined by uterine biopsy ([place]) 01/02/2014  . Constipation, chronic 06/13/2011  . H/O diverticulitis of colon 06/13/2011  . Gastritis 06/05/2011  . Family history of malignant neoplasm of gastrointestinal tract 05/25/2011  . Esophageal reflux 05/25/2011  . Diverticulosis of colon (without mention of hemorrhage) 04/26/2011  . Abdominal pain 04/26/2011  . Constipation, outlet dysfunction 04/26/2011    Past Surgical History:  Procedure Laterality Date  . CATARACT EXTRACTION Bilateral 2005  . CATARACT EXTRACTION W/ INTRAOCULAR LENS  IMPLANT, BILATERAL    .  CHOLECYSTECTOMY    . CHOLECYSTECTOMY, LAPAROSCOPIC  2002  . COLONOSCOPY    . EYE SURGERY    . HYSTEROSCOPY W/D&C N/A 12/18/2013   Procedure: DILATATION AND CURETTAGE /HYSTEROSCOPY;  Surgeon: Gus Height, MD;  Location: [facility];  Service: Gynecology;  Laterality: N/A;  . LUMBAR [place] Right 1998   L1-L2  . LYMPH NODE DISSECTION N/A 02/17/2014   Procedure: LYMPH NODE DISSECTION;  Surgeon: Imagene Gurney A. Alycia Rossetti, MD;  Location: [facility];  Service: Gynecology;  Laterality: N/A;  . ROBOTIC ASSISTED TOTAL HYSTERECTOMY WITH BILATERAL SALPINGO OOPHERECTOMY N/A 02/17/2014   Procedure: ROBOTIC ASSISTED TOTAL HYSTERECTOMY WITH BILATERAL SALPINGO OOPHORECTOMY ;  Surgeon: Imagene Gurney A. Alycia Rossetti, MD;  Location: [facility];  Service: Gynecology;  Laterality: N/A;  . ROTATOR CUFF REPAIR Bilateral 2003  . SKIN BIOPSY     Four times last 2005 - nose cancer  . TENDON GRAFT  2003   Bone graft  . TONSILLECTOMY AND ADENOIDECTOMY  1958  . WISDOM TOOTH EXTRACTION       OB History   None      Home Medications    Prior to Admission medications   Medication Sig Start Date End Date Taking? Authorizing Provider  aspirin EC 81 MG tablet Take 81 mg by mouth daily.    [provider]  ramipril (ALTACE) 5 MG capsule TAKE 1 TABLET BY MOUTH EVERY DAY FOR  BLOOD PRESSURE 04/12/15   [provider]  esomeprazole (NEXIUM) 40 MG capsule Take 1 capsule (40 mg total) by mouth daily. 06/13/11 06/23/11  Sable Feil, MD  Linaclotide Rolan Lipa) 145 MCG CAPS Take 1 capsule by mouth daily. 06/13/11 06/23/11  Sable Feil, MD    Family History Family History  Problem Relation Age of Onset  . Heart disease Sister   . Diabetes Brother        x 4  . Colon cancer Sister   . Crohn's disease Unknown        Nephew  . Colon cancer Maternal Aunt   . Colon cancer Maternal Uncle   . Heart disease Brother   . Diabetes Sister        x 2  . Prostate cancer Brother   . Colon polyps Unknown        Nephew  . Leukemia  Sister     Social History Social History   Tobacco Use  . Smoking status: Never Smoker  . Smokeless tobacco: Never Used  Substance Use Topics  . Alcohol use: No  . Drug use: No     Allergies   Benadryl [diphenhydramine hcl (sleep)]; Ciprofloxacin; Pravastatin; Vioxx [rofecoxib]; and Cephalosporins   Review of Systems Review of Systems   Physical Exam Updated Vital Signs BP 122/78 (BP Location: Right Arm)   Pulse 62   Temp 99.2 F (37.3 C) (Oral)   Resp 20   SpO2 100%   Physical Exam  Constitutional: She appears well-developed and well-nourished. No distress.  HENT:  Head: Normocephalic and atraumatic.  Right Ear: Tympanic membrane normal.  Left Ear: Tympanic membrane normal.  Nose: Nose normal.  Mouth/Throat: Uvula is midline, oropharynx is clear and moist and mucous membranes are normal.  Eyes: Pupils are equal, round, and reactive to light. Conjunctivae and EOM are normal.  Neck: Normal range of motion. Neck supple.  Cardiovascular: Normal rate and regular rhythm.  Pulmonary/Chest: Effort normal and breath sounds normal.  Abdominal: Soft. There is no tenderness.  Musculoskeletal:       Right knee: She exhibits swelling. She exhibits no ecchymosis, no deformity, no laceration, no erythema, normal alignment and normal patellar mobility. Decreased range of motion: due to pain. Tenderness found. MCL tenderness noted.  Pedal pulses 2+, adequate circulation.   Neurological: She is alert.  Skin: Skin is warm and dry.  Psychiatric: She has a normal mood and affect. Her behavior is normal.  Nursing note and vitals reviewed.    ED Treatments / Results  Labs (all labs ordered are listed, but only abnormal results are displayed) Labs Reviewed - No data to display Radiology Dg Knee Complete 4 Views Right  Result Date: 07/25/2017 CLINICAL DATA:  [place] yesterday landing on the knees with swelling and pain EXAM: RIGHT KNEE - COMPLETE 4+ VIEW COMPARISON:  Right knee  films of 10/16/2014 FINDINGS: There is tricompartmental degenerative joint disease of the right knee primarily involving the lateral compartment where there is more loss of joint space and spurring present. Some loss of medial compartment and patellofemoral compartment is seen with spurring. There may be a tiny joint effusion present. No fracture is seen. IMPRESSION: Tricompartmental degenerative joint disease of the right knee primarily involving the lateral compartment. Question of a tiny joint effusion. Electronically Signed   By: Ivar Drape M.D.   On: 07/25/2017 14:51    Procedures Procedures (including critical care time)  Medications Ordered in ED Medications - No data to display  Initial Impression / Assessment and Plan / ED Course  I have reviewed the triage vital signs and the nursing notes. 84 y.o. female with right knee pain s/p fall yesterday stable for d/c without neuro deficits and no fracture or dislocation noted on x-ray. Patient given a knee sleeve for comfort and she will take tylenol as needed for pain. She will apply ice and elevate the area as often as possible. Patient to f/u with PCP. Patient agrees with plan.   Final Clinical Impressions(s) / ED Diagnoses   Final diagnoses:  Contusion of right knee, initial encounter    ED Discharge Orders    None       Debroah Baller [place], NP 07/25/17 2103    Deno Etienne, DO 07/26/17 [phone]

## 2017-07-25 NOTE — Discharge Instructions (Addendum)
Take tylenol or ibuprofen as needed for pain. Follow up with your doctor. Return here for worsening symptoms. Wear the knee sleeve for comfort.

## 2017-07-25 NOTE — ED Notes (Signed)
Pt is alert and oriented x 4 and is verbally responsive. Pt reports falling yesterday and landing on her knees. Pt is c/o rt knee pain area appears swollen and is tender with touch. Pt denies LOC or any other injuries. Pt reports she is able to ambulate but is painful. Pt reports that she felt as though her feet sliped out underneath her.

## 2017-07-25 NOTE — ED Triage Notes (Signed)
Pt complaint of right knee pain post fall yesterday.

## 2017-08-01 ENCOUNTER — Encounter: Payer: Self-pay | Admitting: Obstetrics

## 2017-08-01 ENCOUNTER — Other Ambulatory Visit (HOSPITAL_COMMUNITY)
Admission: RE | Admit: 2017-08-01 | Discharge: 2017-08-01 | Disposition: A | Discharge status: Home / Self Care | Payer: PPO | Source: Ambulatory Visit | Attending: Obstetrics | Admitting: Obstetrics

## 2017-08-01 ENCOUNTER — Inpatient Hospital Stay: Payer: PPO | Attending: Obstetrics | Admitting: Obstetrics

## 2017-08-01 ENCOUNTER — Encounter: Payer: Self-pay | Admitting: Gynecologic Oncology

## 2017-08-01 VITALS — BP 127/62 | HR 53 | Temp 97.8°F | Resp 18 | Ht 63.5 in | Wt 121.3 lb

## 2017-08-01 DIAGNOSIS — Z800 Family history of malignant neoplasm of digestive organs: Secondary | ICD-10-CM | POA: Diagnosis not present

## 2017-08-01 DIAGNOSIS — C541 Malignant neoplasm of endometrium: Secondary | ICD-10-CM | POA: Insufficient documentation

## 2017-08-01 DIAGNOSIS — Z90722 Acquired absence of ovaries, bilateral: Secondary | ICD-10-CM | POA: Insufficient documentation

## 2017-08-01 DIAGNOSIS — Z90710 Acquired absence of both cervix and uterus: Secondary | ICD-10-CM

## 2017-08-01 NOTE — Patient Instructions (Signed)
1. Return to clinic in 6 months for a pelvic exam 2. Genetics referral for personal / family history of cancer to see if there is a hereditary component.

## 2017-08-01 NOTE — Progress Notes (Signed)
MSI testing ordered on [id number] with Maudie Mercury at [facility].

## 2017-08-01 NOTE — Progress Notes (Addendum)
Progress Note: Gyn-Onc  Originally referred by Dr. Gus Height  CC:  Chief Complaint  Patient presents with  . endometrial cancer    HPI: [name]  is a very nice 84 y.o.  P0  . Interval History:  Recall - she has a strong family history of colon caner (sister, aunt and uncle). Her last colonoscopy revealed benign polyps.   She is virginal and nulliparous.   Today she complains of vaginal versus other drainage but states it is clear.   . Initial Presentation: She noted a 4-6 month history of abnormal bloody vaginal discharge. She was seen by her gynecologist Dr Gus Height who performed a transvaginal US on 12/11/13 which showed a 6.6x3.8x3x7cm uterus with an endometrial thickness of 72m. The ovaries were not visualized. 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  02/17/14: Surgery: Total robotic hysterectomy bilateral salpingo-oophorectomy, bilateral pelvic and para-aortic lymph node dissection.  Operative findings: Narrow vaginal introitus. Normal appearing uterus, cervix and adnexa. No obvious evidence of metastatic disease. Normal appearing appendix. Adhesive disease of the omentum to the anterior abdominal wall.  Her final pathology : 1. Lymph node, biopsy, para-aortic, right - THREE LYMPH NODES, NEGATIVE FOR METASTATIC CARCINOMA (0/3). 2. Lymph node, biopsy, para-aortic, left - ONE LYMPH NODE, NEGATIVE FOR METASTATIC CARCINOMA (0/1). 3. Uterus +/- tubes/ovaries, neoplastic, with cervix - INVASIVE ENDOMETRIOID CARCINOMA, FIGO GRADE II, 2.6 CM INVADING INTO THE OUTER HALF OF THE MYOMETRIUM. - NO EVIDENCE OF ANGIOLYMPHATIC INVASION IDENTIFIED. - BILATERAL OVARIES: BENIGN OVARIAN TISSUE WITH ENDOSALPINGIOSIS, NO ATYPIA OR MALIGNANCY. - BILATERAL FALLOPIAN TUBES: BENIGN FALLOPIAN TUBAL TISSUE, NO EVIDENCE OF ATYPIA OR MALIGNANCY. - CERVIX: BENIGN ENDOCERVICAL MUCOSA, NO DYSPLASIA OR  MALIGNANCY. 4. Lymph node, biopsy, right pelvic - SIX LYMPH NODES, NEGATIVE FOR METASTATIC CARCINOMA (0/6). 5. Lymph node, biopsy, left pelvic - TWO LYMPH NODES, NEGATIVE FOR METASTATIC CARCINOMA (0/2).   Thus she was Stage IB grade 2 endometrioid adenocarcinoma  - 66% myometrial invasion.  - 0 out of 12 lymph nodes were involved  - no lymphovascular space involvement.   Per GOG 99 she would benefit from vaginal cuff brachytherapy. She ultimately declined radiation therapy after seeing Dr. KSondra Come    Measurement of disease: Clinical exam and Pap 04/2015 - Pap negative  Radiology:  01/06/2014 - CT AP - [facility] - Lower chest: Clear lung bases. Mild cardiomegaly, accentuated by a pectus excavatum deformity. Right coronary artery atherosclerosis. Cholecystectomy, without biliary ductal dilatation. Mild bilateral renal cortical atrophy. Bilateral multiple renal cysts and too small to characterize lesions. An interpolar left renal lesion measures fluid density centrally but demonstrates minimal increased density in its dependent portion on image 36 of series 2. 1.5 cm.  Extensive colonic diverticulosis with muscular hypertrophy involving the sigmoid. Atherosclerosis, within the aorta and at the origin of both renal arteries. No retroperitoneal or retrocrural adenopathy. No pelvic adenopathy. Moderate pelvic floor laxity. Retroverted uterus. Central hypoattenuation with surrounding hyper enhancement involving the uterine fundus. This is retroverted, including on sagittal image 53. No gross extension outside of the uterus. Other: No evidence of omental or peritoneal disease. No abdominal pelvic fluid. Musculoskeletal: Degenerative sclerosis involving the symphysis pubis. Right hip osteoarthritis. Lumbar spondylosis.  02/2014 - CXR - negative .   Oncologic History:      [place]   01/02/2014 Initial Diagnosis    Endometrial cancer determined by  uterine biopsy      02/17/2014  Surgery    IA Grade 2, Vaginal cuff brachytherapy per GOG 99 for depth of invasion and grade 2, no LVSI      03/30/2014 -  Radiation Therapy    declined by patient.       Current Meds:  Outpatient Encounter Medications as of 08/01/2017  Medication Sig  . [DISCONTINUED] aspirin EC 81 MG tablet Take 81 mg by mouth daily.  . [DISCONTINUED] esomeprazole (NEXIUM) 40 MG capsule Take 1 capsule (40 mg total) by mouth daily.  . [DISCONTINUED] Linaclotide (LINZESS) 145 MCG CAPS Take 1 capsule by mouth daily.  . [DISCONTINUED] ramipril (ALTACE) 5 MG capsule TAKE 1 TABLET BY MOUTH EVERY DAY FOR BLOOD PRESSURE   No facility-administered encounter medications on file as of 08/01/2017.     Allergy:  Allergies  Allergen Reactions  . Benadryl [Diphenhydramine Hcl (Sleep)] Other (See Comments)    Light headed, heart racing.   . Ciprofloxacin Other (See Comments)    Leg pains  . Pravastatin Other (See Comments)    Leg cramps  . Vioxx [Rofecoxib] Nausea Only  . Cephalosporins Rash    Social Hx:   Social History   Socioeconomic History  . Marital status: Single    Spouse name: Not on file  . Number of children: 0  . Years of education: Not on file  . Highest education level: Not on file  Occupational History  . Occupation: Retired  Scientific laboratory technician  . Financial resource strain: Not on file  . Food insecurity:    Worry: Not on file    Inability: Not on file  . Transportation needs:    Medical: Not on file    Non-medical: Not on file  Tobacco Use  . Smoking status: Never Smoker  . Smokeless tobacco: Never Used  Substance and Sexual Activity  . Alcohol use: No  . Drug use: No  . Sexual activity: Never    Birth control/protection: Post-menopausal  Lifestyle  . Physical activity:    Days per week: Not on file    Minutes per session: Not on file  . Stress: Not on file  Relationships  . Social connections:    Talks on phone: Not on file    Gets  together: Not on file    Attends religious service: Not on file    Active member of club or organization: Not on file    Attends meetings of clubs or organizations: Not on file    Relationship status: Not on file  . Intimate partner violence:    Fear of current or ex partner: Not on file    Emotionally abused: Not on file    Physically abused: Not on file    Forced sexual activity: Not on file  Other Topics Concern  . Not on file  Social History Narrative  . Not on file    Past Surgical Hx:  Past Surgical History:  Procedure Laterality Date  . CATARACT EXTRACTION Bilateral 2005  . CATARACT EXTRACTION W/ INTRAOCULAR LENS  IMPLANT, BILATERAL    . CHOLECYSTECTOMY    . CHOLECYSTECTOMY, LAPAROSCOPIC  2002  . COLONOSCOPY    . EYE SURGERY    . HYSTEROSCOPY W/D&C N/A 12/18/2013   Procedure: DILATATION AND CURETTAGE /HYSTEROSCOPY;  Surgeon: Gus Height, MD;  Location: [facility];  Service: Gynecology;  Laterality: N/A;  . LUMBAR [place] Right 1998   L1-L2  . LYMPH NODE DISSECTION N/A 02/17/2014   Procedure: LYMPH NODE DISSECTION;  Surgeon: Imagene Gurney A. Alycia Rossetti, MD;  Location:  [facility];  Service: Gynecology;  Laterality: N/A;  . ROBOTIC ASSISTED TOTAL HYSTERECTOMY WITH BILATERAL SALPINGO OOPHERECTOMY N/A 02/17/2014   with Lymph node dissection ; Surgeon: Imagene Gurney A. Alycia Rossetti, MD;  Location: [facility];  Service: Gynecology;  Laterality: N/A;  . ROTATOR CUFF REPAIR Bilateral 2003  . SKIN BIOPSY     Four times last 2005 - nose cancer  . TENDON GRAFT  2003   Bone graft  . TONSILLECTOMY AND ADENOIDECTOMY  1958  . WISDOM TOOTH EXTRACTION      Past Medical Hx:  Past Medical History:  Diagnosis Date  . Arthritis   . Baker cyst    a. 04/2014 on right.  . Basal cell carcinoma of nose   . CAD (coronary artery disease)    Based on CT imaging 12/2013  . Colitis, ischemic ([place])   . Diverticulosis of colon   . Endometrial cancer ([place]) 12/2013  . Esophageal stricture   . Essential hypertension   .  Familial tremor   . Family history of cancer   . Gastritis and duodenitis   . 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 History of gallstones   . History of kidney stones   . Hypercholesterolemia   . Obesity   . TIA (transient ischemic attack)   . Varicose veins     Past Gynecological History:   GYNECOLOGIC HISTORY:  No LMP recorded. Patient is postmenopausal. Virginal, nulliparous, menopause in her 65's     Family Hx:  Family History  Problem Relation Age of Onset  . Heart disease Sister   . Diabetes Brother        x 4  . Colon cancer Sister        dx in her 8s  . Diabetes Sister   . Crohn's disease Other        Nephew  . Colon cancer Maternal Aunt   . Colon cancer Maternal Uncle   . Heart disease Brother   . Colon polyps Other        Nephew  . Leukemia Sister   . Diabetes Sister   . Melanoma Other 34       niece  . Lung cancer Other   . Leukemia Other        great niece    Review of Systems:  Review of Systems  HENT:   Positive for tinnitus.   Genitourinary: Positive for vaginal discharge.   Musculoskeletal: Positive for arthralgias.  All other systems reviewed and are negative.    Vitals:  Blood pressure 127/62, pulse (!) 53, temperature 97.8 F (36.6 C), temperature source Oral, resp. rate 18, height 5' 3.5" (1.613 m), weight 121 lb 4.8 oz (55 kg), SpO2 99 %. Body mass index is 21.15 kg/m.  Physical Exam: ECOG PERFORMANCE STATUS: 1 - Symptomatic but completely ambulatory   General :  Well developed, 84 y.o., female in no apparent distress HEENT:  Normocephalic/atraumatic, symmetric, EOMI, eyelids normal Neck:   Supple, no masses.  Lymphatics:  No cervical/ submandibular/ supraclavicular/ infraclavicular/ inguinal adenopathy Respiratory:  Respirations unlabored, no use of accessory muscles CV:   Deferred Breast:  Deferred Musculoskeletal: No CVA tenderness, normal muscle strength. Abdomen:  Soft, non-tender  and nondistended. No evidence of hernia. No masses. Extremities:  No lymphedema, no erythema, non-tender. Skin:   Normal inspection Neuro/Psych:  No focal motor deficit, no abnormal mental status. Normal gait. Normal affect. Alert and oriented to person, place, and time  Genito Urinary: Vulva: Normal external  female genitalia.  Bladder/urethra: Urethral meatus normal in size and location. No lesions or   masses, well supported bladder Speculum exam: Vagina: Notable atrophy.  No lesion, no discharge, no bleeding. Cervix: Surgically absent. Bimanual exam:  Uterus: Surgically absent.  Adnexa: No masses. Rectovaginal:  Good tone, no masses, no cul de sac nodularity, no parametrial involvement or nodularity.   Oncologic Summary: 1. HIR Stage IB, Grade 2 Endometrioid Endometrial Adenocarcinoma 02/2014  Declined adjuvant brachy   Assessment/Plan: 1. Given her family and personal history I recommended a genetics counseling visit and she agreed. 2. I do not see any explanation for her complaints of discharge. She does have some atrophy but given her difficulty with pelvic exams/virginal status I am not going to recommend vaginal estrogen. 3. Surveillance plan o Q 6 months until 5 years (02/2019) o Pap annually (Q April) given her higher risk for vaginal cuff recurrence (declined vaginal brachy with HIR disease)    Isabel Caprice, MD  08/20/2017, 4:39 PM   Cc: Gus Height, MD (referring Ob/Gyn) Burnard Bunting, MD (PCP)

## 2017-08-02 ENCOUNTER — Telehealth: Payer: Self-pay | Admitting: Genetic Counselor

## 2017-08-02 NOTE — Telephone Encounter (Signed)
Scheduled appt per 4/24 sch message - pts niece is aware of appt date and time

## 2017-08-06 ENCOUNTER — Other Ambulatory Visit (HOSPITAL_COMMUNITY)
Admission: RE | Admit: 2017-08-06 | Discharge: 2017-08-06 | Disposition: A | Discharge status: Home / Self Care | Payer: PPO | Source: Ambulatory Visit | Attending: Obstetrics | Admitting: Obstetrics

## 2017-08-06 DIAGNOSIS — Z808 Family history of malignant neoplasm of other organs or systems: Secondary | ICD-10-CM | POA: Diagnosis not present

## 2017-08-06 DIAGNOSIS — Z08 Encounter for follow-up examination after completed treatment for malignant neoplasm: Secondary | ICD-10-CM | POA: Insufficient documentation

## 2017-08-06 DIAGNOSIS — C541 Malignant neoplasm of endometrium: Secondary | ICD-10-CM | POA: Diagnosis present

## 2017-08-06 DIAGNOSIS — Z8542 Personal history of malignant neoplasm of other parts of uterus: Secondary | ICD-10-CM | POA: Insufficient documentation

## 2017-08-06 LAB — CYTOLOGY - PAP: Diagnosis: NEGATIVE

## 2017-08-07 ENCOUNTER — Telehealth: Payer: Self-pay

## 2017-08-07 NOTE — Telephone Encounter (Signed)
Pt notified about pap results: negative.  No questions or concerns voiced. 

## 2017-08-08 NOTE — Progress Notes (Signed)
Let her know normal pap

## 2017-08-20 ENCOUNTER — Encounter: Payer: Self-pay | Admitting: Genetic Counselor

## 2017-08-20 ENCOUNTER — Inpatient Hospital Stay: Payer: PPO | Attending: Obstetrics | Admitting: Genetic Counselor

## 2017-08-20 ENCOUNTER — Inpatient Hospital Stay: Payer: PPO

## 2017-08-20 ENCOUNTER — Encounter: Payer: Self-pay | Admitting: Obstetrics

## 2017-08-20 DIAGNOSIS — R897 Abnormal histological findings in specimens from other organs, systems and tissues: Secondary | ICD-10-CM | POA: Diagnosis not present

## 2017-08-20 DIAGNOSIS — Z808 Family history of malignant neoplasm of other organs or systems: Secondary | ICD-10-CM | POA: Diagnosis not present

## 2017-08-20 DIAGNOSIS — Z801 Family history of malignant neoplasm of trachea, bronchus and lung: Secondary | ICD-10-CM | POA: Diagnosis not present

## 2017-08-20 DIAGNOSIS — Z806 Family history of leukemia: Secondary | ICD-10-CM | POA: Diagnosis not present

## 2017-08-20 DIAGNOSIS — Z800 Family history of malignant neoplasm of digestive organs: Secondary | ICD-10-CM | POA: Insufficient documentation

## 2017-08-20 DIAGNOSIS — C541 Malignant neoplasm of endometrium: Secondary | ICD-10-CM

## 2017-08-20 NOTE — Progress Notes (Signed)
REFERRING PROVIDER: Isabel Caprice, MD [address] [place], [place] [address]  PRIMARY PROVIDER:  Burnard Bunting, MD  PRIMARY REASON FOR VISIT:  1. Endometrial cancer determined by uterine biopsy ([place])   2. Family history of colon cancer   3. Family history of melanoma   4. Family history of leukemia   5. 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 female, was seen for a [place] cancer genetics consultation at the request of Dr. Gerarda Fraction due to a personal and family history of cancer.  [name] presents to clinic today to discuss the possibility of a hereditary predisposition to cancer, genetic testing, and to further clarify her future cancer risks, as well as potential cancer risks for family members.   In 2015, at the age of 65, [name] was diagnosed with endometrial cancer.  This was treated with surgery.  Tumor testing was performed and found to be MSI-H.  IHC was not performed, and no testing for MLH1 hypermethylation was performed. The patient has memory loss and was not a good historian. Her niece was with her and helped with the family history.    CANCER HISTORY:    Endometrial cancer determined by uterine biopsy ([place])   01/02/2014 Initial Diagnosis    Endometrial cancer determined by uterine biopsy      02/17/2014 Surgery    IA Grade 2, Vaginal cuff brachytherapy per GOG 99 for depth of invasion and grade 2, no LVSI      03/30/2014 -  Radiation Therapy    declined by patient.        HORMONAL RISK FACTORS:  Menarche was at age 14-13.  First live birth at age N/A.  OCP use for approximately 0 years.  Ovaries intact: no.  Hysterectomy: yes.  Menopausal status: postmenopausal.  HRT use: 0 years. Colonoscopy: yes; normal. Mammogram within the last year: no. Number of breast biopsies: 0. Up to date with pelvic exams:  yes. Any excessive radiation exposure in the past:   no  Past Medical History:  Diagnosis Date  . Arthritis   . Baker cyst    a. 04/2014 on right.  . Basal cell carcinoma of nose   . CAD (coronary artery disease)    Based on CT imaging 12/2013  . Colitis, ischemic ([place])   . Diverticulosis of colon   . Endometrial cancer ([place]) 12/2013  . Esophageal stricture   . Essential hypertension   . Familial tremor   . Family history of colon cancer   . Family history of leukemia   . Family history of melanoma   . Gastritis and duodenitis   . 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 History of gallstones   . History of kidney stones   . Hypercholesterolemia   . Obesity   . TIA (transient ischemic attack)   . Varicose veins     Past Surgical History:  Procedure Laterality Date  . CATARACT EXTRACTION Bilateral 2005  . CATARACT EXTRACTION W/ INTRAOCULAR LENS  IMPLANT, BILATERAL    . CHOLECYSTECTOMY    . CHOLECYSTECTOMY, LAPAROSCOPIC  2002  . COLONOSCOPY    . EYE SURGERY    . HYSTEROSCOPY W/D&C N/A 12/18/2013   Procedure: DILATATION AND CURETTAGE /HYSTEROSCOPY;  Surgeon: Gus Height, MD;  Location: [facility];  Service: Gynecology;  Laterality: N/A;  . LUMBAR [place] Right 1998   L1-L2  .  LYMPH NODE DISSECTION N/A 02/17/2014   Procedure: LYMPH NODE DISSECTION;  Surgeon: Imagene Gurney A. Alycia Rossetti, MD;  Location: [facility];  Service: Gynecology;  Laterality: N/A;  . ROBOTIC ASSISTED TOTAL HYSTERECTOMY WITH BILATERAL SALPINGO OOPHERECTOMY N/A 02/17/2014   Procedure: ROBOTIC ASSISTED TOTAL HYSTERECTOMY WITH BILATERAL SALPINGO OOPHORECTOMY ;  Surgeon: Imagene Gurney A. Alycia Rossetti, MD;  Location: [facility];  Service: Gynecology;  Laterality: N/A;  . ROTATOR CUFF REPAIR Bilateral 2003  . SKIN BIOPSY     Four times last 2005 - nose cancer  . TENDON GRAFT  2003   Bone graft  . TONSILLECTOMY AND ADENOIDECTOMY  1958  . WISDOM TOOTH EXTRACTION      Social History   Socioeconomic History  . Marital status: Single    Spouse name: Not on  file  . Number of children: 0  . Years of education: Not on file  . Highest education level: Not on file  Occupational History  . Occupation: Retired  Scientific laboratory technician  . Financial resource strain: Not on file  . Food insecurity:    Worry: Not on file    Inability: Not on file  . Transportation needs:    Medical: Not on file    Non-medical: Not on file  Tobacco Use  . Smoking status: Never Smoker  . Smokeless tobacco: Never Used  Substance and Sexual Activity  . Alcohol use: No  . Drug use: No  . Sexual activity: Never    Birth control/protection: Post-menopausal  Lifestyle  . Physical activity:    Days per week: Not on file    Minutes per session: Not on file  . Stress: Not on file  Relationships  . Social connections:    Talks on phone: Not on file    Gets together: Not on file    Attends religious service: Not on file    Active member of club or organization: Not on file    Attends meetings of clubs or organizations: Not on file    Relationship status: Not on file  Other Topics Concern  . Not on file  Social History Narrative  . Not on file     FAMILY HISTORY:  We obtained a detailed, 4-generation family history.  Significant diagnoses are listed below: Family History  Problem Relation Age of Onset  . Heart disease Sister   . Diabetes Brother        x 4  . Colon cancer Sister        dx in her 45s  . Diabetes Sister   . Crohn's disease Other        Nephew  . Colon cancer Maternal Aunt   . Colon cancer Maternal Uncle   . Heart disease Brother   . Colon polyps Other        Nephew  . Leukemia Sister   . Diabetes Sister   . Melanoma Other 32       niece  . Lung cancer Other   . Leukemia Other        great niece    The patient does not have children.  She has four sisters and four brothers.  One sister had colon cancer in her 78's, another sister had leukemia,.  A sister who did not have cancer has a daughter who had colon polyps and another daughter with  melanoma at 32 and a son with lung cancer.  This son has a daughter with leukemia.  The patient's parents are both deceased.  The patient's mother died at  34 from old age.  She may have had an oral cancer at this time.  Her mother had 9 siblings, one sister and one brother had colon cancer.  There is no other reported family history of cancer on the maternal side.  The patient's father died of a heart attack.  He had 9-10 siblings, none who had cancer.  [name] is unaware of previous family history of genetic testing for hereditary cancer risks. Patient's maternal ancestors are of [place] and native American descent, and paternal ancestors are of English descent. There is no reported Ashkenazi Jewish ancestry. There is no known consanguinity.  GENETIC COUNSELING ASSESSMENT: [name] is a 84 y.o. female with a personal and family history of cancer which is somewhat suggestive of a hereditary cancer syndrome and predisposition to cancer. We, therefore, discussed and recommended the following at today's visit.   DISCUSSION: We disucssed that up to 5% of uterine cancer is due to hereditary causes, most commonly Lynch syndrome.  We disucssed that cancers that are associated with Lynch syndrome and described the MSI testing.  MSI testing that has high instability is more commonly associated with Lynch syndrome.  Other testing can sometimes be performed to determined if Lynch syndrome is more likely.  This testing was not performed.  We reviewed the characteristics, features and inheritance patterns of hereditary cancer syndromes. We also discussed genetic testing, including the appropriate family members to test, the process of testing, insurance coverage and turn-around-time for results. We discussed the implications of a negative, positive and/or variant of uncertain significant result. We recommended [name] pursue genetic testing for the Multi cancer gene panel. The Multi-Gene Panel offered by  [facility] includes sequencing and/or deletion duplication testing of the following 83 genes: ALK, APC, ATM, AXIN2,BAP1,  BARD1, BLM, BMPR1A, BRCA1, BRCA2, BRIP1, CASR, CDC73, CDH1, CDK4, CDKN1B, CDKN1C, CDKN2A (p14ARF), CDKN2A (p16INK4a), CEBPA, CHEK2, CTNNA1, DICER1, DIS3L2, EGFR (c.2369C>T, p.Thr790Met variant only), EPCAM (Deletion/duplication testing only), FH, FLCN, GATA2, GPC3, GREM1 (Promoter region deletion/duplication testing only), HOXB13 (c.251G>A, p.Gly84Glu), HRAS, KIT, MAX, MEN1, MET, MITF (c.952G>A, p.Glu318Lys variant only), MLH1, MSH2, MSH3, MSH6, MUTYH, NBN, NF1, NF2, NTHL1, PALB2, PDGFRA, PHOX2B, PMS2, POLD1, POLE, POT1, PRKAR1A, PTCH1, PTEN, RAD50, RAD51C, RAD51D, RB1, RECQL4, RET, RUNX1, SDHAF2, SDHA (sequence changes only), SDHB, SDHC, SDHD, SMAD4, SMARCA4, SMARCB1, SMARCE1, STK11, SUFU, TERT, TERT, TMEM127, TP53, TSC1, TSC2, VHL, WRN and WT1.    Based on [name]'s personal and family history of cancer, she meets medical criteria for genetic testing. Despite that she meets criteria, she may still have an out of pocket cost. We discussed that if her out of pocket cost for testing is over $100, the laboratory will call and confirm whether she wants to proceed with testing.  If the out of pocket cost of testing is less than $100 she will be billed by the genetic testing laboratory.   PLAN: After considering the risks, benefits, and limitations, [name]  provided informed consent to pursue genetic testing and the blood sample was sent to [facility] for analysis of the Multi cancer panel. Results should be available within approximately 2-3 weeks' time, at which point they will be disclosed by telephone to [name], as will any additional recommendations warranted by these results. [name] will receive a summary of her genetic counseling visit and a copy of her results once available. This information will also be available in Epic. We encouraged [name] to remain in contact with  cancer genetics annually so that  we can continuously update the family history and inform her of any changes in cancer genetics and testing that may be of benefit for her family. [name] questions were answered to her satisfaction today. Our contact information was provided should additional questions or concerns arise.  Lastly, we encouraged [name] to remain in contact with cancer genetics annually so that we can continuously update the family history and inform her of any changes in cancer genetics and testing that may be of benefit for this family.   Ms.  [name] questions were answered to her satisfaction today. Our contact information was provided should additional questions or concerns arise. Thank you for the referral and allowing [place] to share in the care of your patient.   Karen P. Florene Glen, [place], [facility],The Certified Genetic Counselor [email or web address] phone: [phone]  The patient was seen for a total of 55 minutes in face-to-face genetic counseling.  This patient was discussed with Drs. Magrinat, Lindi Adie and/or Burr Medico who agrees with the above.    _______________________________________________________________________ For Office Staff:  Number of people involved in session: 2 Was an Intern/ student involved with case: no

## 2017-08-31 ENCOUNTER — Encounter: Payer: Self-pay | Admitting: Genetic Counselor

## 2017-08-31 NOTE — Progress Notes (Signed)
I received a call from [facility], about patient genetic test results.  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  RAD50 and CTNNA1 are associated with 5q deletions, which are indicative of myeloid disease, specifically AML or MDS.  They recommended that patient be worked up for AML or MDS.  IF patient is asymptomatic, it could be that she has early stage disease.  If workup does not find disease, we could confirm RAD50 pathogenic mutation through another tissue such as skin fibroblasts.  I called GYN office and left message.  Eventually spoke with Barbaraann Share, who will leave a message for [employer].

## 2017-09-04 ENCOUNTER — Other Ambulatory Visit: Payer: Self-pay | Admitting: Gynecologic Oncology

## 2017-09-04 ENCOUNTER — Telehealth: Payer: Self-pay | Admitting: Genetic Counselor

## 2017-09-04 DIAGNOSIS — R898 Other abnormal findings in specimens from other organs, systems and tissues: Secondary | ICD-10-CM

## 2017-09-04 NOTE — Telephone Encounter (Signed)
Revealed genetic test results to [facility].  Discussed that Emorie was found to have genetic changes in two genes - RAD50 and CTNNA1 that were mosaic.  The RAD50 is pathogenic, and CTNNA1 is a VUS.  Additionally, there was a VUS in TP53.  Typically VUS results are considered normal results, however, based on the mosaic pattern there is a 5q loss which is suggestive of a myeloid condition such as AML or MDS.  The TP53 VUS is also suggestive of this as well, as this could be due to CHIP.  I let Ivin Booty know that we discussed with the GYN office, they wanted me to call and let her know the results and see how the family would like to proceed.  Ivin Booty indicated that they want to get her worked up for the Myeloid condition.  If her work-up shows that she does not have AML/MDS, then we can work up the genetic testing results.

## 2017-09-05 ENCOUNTER — Ambulatory Visit: Payer: Self-pay | Admitting: Genetic Counselor

## 2017-09-05 DIAGNOSIS — Z806 Family history of leukemia: Secondary | ICD-10-CM

## 2017-09-05 DIAGNOSIS — C541 Malignant neoplasm of endometrium: Secondary | ICD-10-CM

## 2017-09-05 DIAGNOSIS — Z808 Family history of malignant neoplasm of other organs or systems: Secondary | ICD-10-CM

## 2017-09-05 DIAGNOSIS — Z1379 Encounter for other screening for genetic and chromosomal anomalies: Secondary | ICD-10-CM

## 2017-09-05 DIAGNOSIS — Z800 Family history of malignant neoplasm of digestive organs: Secondary | ICD-10-CM

## 2017-09-05 NOTE — Progress Notes (Signed)
HPI:  [name]. [name] was previously seen in the [facility] clinic due to a personal and family history of cancer and concerns regarding a hereditary predisposition to cancer. Please refer to our prior cancer genetics clinic note for more information regarding [name]. [name]'s medical, social and family histories, and our assessment and recommendations, at the time. [name]. [name] recent genetic test results were disclosed to her, as were recommendations warranted by these results. These results and recommendations are discussed in more detail below.  CANCER HISTORY:    Endometrial cancer determined by uterine biopsy ([place])   01/02/2014 Initial Diagnosis    Endometrial cancer determined by uterine biopsy      02/17/2014 Surgery    IA Grade 2, Vaginal cuff brachytherapy per GOG 99 for depth of invasion and grade 2, no LVSI      03/30/2014 -  Radiation Therapy    declined by patient.      08/31/2017 Genetic Testing    TP53 c.707A>G (p.Tyr236Cys), and Possibly mosaic copy number losses identified for the RAD50 deletion (entire coding sequence) and CTNNA1 Deletion (entire coding sequence) found on the multicancer panel.  The Multi-Gene Panel offered by [facility] includes sequencing and/or deletion duplication testing of the following 83 genes: ALK, APC, ATM, AXIN2,BAP1,  BARD1, BLM, BMPR1A, BRCA1, BRCA2, BRIP1, CASR, CDC73, CDH1, CDK4, CDKN1B, CDKN1C, CDKN2A (p14ARF), CDKN2A (p16INK4a), CEBPA, CHEK2, CTNNA1, DICER1, DIS3L2, EGFR (c.2369C>T, p.Thr790Met variant only), EPCAM (Deletion/duplication testing only), FH, FLCN, GATA2, GPC3, GREM1 (Promoter region deletion/duplication testing only), HOXB13 (c.251G>A, p.Gly84Glu), HRAS, KIT, MAX, MEN1, MET, MITF (c.952G>A, p.Glu318Lys variant only), MLH1, MSH2, MSH3, MSH6, MUTYH, NBN, NF1, NF2, NTHL1, PALB2, PDGFRA, PHOX2B, PMS2, POLD1, POLE, POT1, PRKAR1A, PTCH1, PTEN, RAD50, RAD51C, RAD51D, RB1, RECQL4, RET, RUNX1, SDHAF2, SDHA (sequence changes only), SDHB, SDHC,  SDHD, SMAD4, SMARCA4, SMARCB1, SMARCE1, STK11, SUFU, TERT, TERT, TMEM127, TP53, TSC1, TSC2, VHL, WRN and WT1.   The results reported out on Aug 31, 2017.  This result is indicative of a 5q deletion, which can be seen in myeloid hematolgical conditions.  Recommend that patient be worked up for AML or MDS.       FAMILY HISTORY:  We obtained a detailed, 4-generation family history.  Significant diagnoses are listed below: Family History  Problem Relation Age of Onset  . Heart disease Sister   . Diabetes Brother        x 4  . Colon cancer Sister        dx in her 82s  . Diabetes Sister   . Crohn's disease Other        Nephew  . Colon cancer Maternal Aunt   . Colon cancer Maternal Uncle   . Heart disease Brother   . Colon polyps Other        Nephew  . Leukemia Sister   . Diabetes Sister   . Melanoma Other 52       niece  . Lung cancer Other   . Leukemia Other        great niece    The patient does not have children.  She has four sisters and four brothers.  One sister had colon cancer in her 65's, another sister had leukemia,.  A sister who did not have cancer has a daughter who had colon polyps and another daughter with melanoma at 29 and a son with lung cancer.  This son has a daughter with leukemia.  The patient's parents are both deceased.  The patient's mother died at 33 from old  age.  She may have had an oral cancer at this time.  Her mother had 9 siblings, one sister and one brother had colon cancer.  There is no other reported family history of cancer on the maternal side.  The patient's father died of a heart attack.  He had 9-10 siblings, none who had cancer.  [name]. [name] is unaware of previous family history of genetic testing for hereditary cancer risks. Patient's maternal ancestors are of [place] and native American descent, and paternal ancestors are of English descent. There is no reported Ashkenazi Jewish ancestry. There is no known consanguinity.  GENETIC TEST  RESULTS: Genetic testing reported out on Aug 31, 2017 through the multi-cancer panel found a deleterious mutations in RAD50 called Deletion (Entire coding sequence) (possibly mosaic).  The Multi-Gene Panel offered by [facility] includes sequencing and/or deletion duplication testing of the following 83 genes: ALK, APC, ATM, AXIN2,BAP1,  BARD1, BLM, BMPR1A, BRCA1, BRCA2, BRIP1, CASR, CDC73, CDH1, CDK4, CDKN1B, CDKN1C, CDKN2A (p14ARF), CDKN2A (p16INK4a), CEBPA, CHEK2, CTNNA1, DICER1, DIS3L2, EGFR (c.2369C>T, p.Thr790Met variant only), EPCAM (Deletion/duplication testing only), FH, FLCN, GATA2, GPC3, GREM1 (Promoter region deletion/duplication testing only), HOXB13 (c.251G>A, p.Gly84Glu), HRAS, KIT, MAX, MEN1, MET, MITF (c.952G>A, p.Glu318Lys variant only), MLH1, MSH2, MSH3, MSH6, MUTYH, NBN, NF1, NF2, NTHL1, PALB2, PDGFRA, PHOX2B, PMS2, POLD1, POLE, POT1, PRKAR1A, PTCH1, PTEN, RAD50, RAD51C, RAD51D, RB1, RECQL4, RET, RUNX1, SDHAF2, SDHA (sequence changes only), SDHB, SDHC, SDHD, SMAD4, SMARCA4, SMARCB1, SMARCE1, STK11, SUFU, TERT, TERT, TMEM127, TP53, TSC1, TSC2, VHL, WRN and WT1.  The test report has been scanned into EPIC and is located under the Molecular Pathology section of the Results Review tab.    Genetic testing also detected two Variants of Unknown Significance -one  in the TP53 gene called c.707A>G (p.Tyr236Cys) and another in the CTNNA1 gene called Deletion (Entire coding sequence) (possibly mosaic). At this time, it is unknown if these variants are associated with increased cancer risk or if this is a normal finding, but most variants such as this get reclassified to being inconsequential. They should not be used to make medical management decisions. With time, we suspect the lab will determine the significance of this variant, if any. If we do learn more about it, we will try to contact [name]. [name] to discuss it further. However, it is important to stay in touch with [place] periodically and keep the address and  phone number up to date.  Copy number losses were identified in the RAD50 and CTNNA1 gene(s), both of which are located on chromosome 5.  The specifics of the even, including the size and whether they are germline or somatic in nature cannot be determined using this assay.  Clinical correlation is warranted and cytogenetic analysis may be considered to determine the underlying etiology of this result.   ADDITIONAL TESTING: We discussed with [name]. [place] that she may need to be assessed for myeloid conditions such as AML or MDS.  Blood can be drawn to determine whether there is a chromosome abnormality involving chromosome 5 that could explain this result.  Should testing indicate that she does not have a myeloid condition, we can consider performing a skin biopsy and test for these genetic changes in a different tissue.  Lastly, we could work the family up for the pathogenic change in RAD50.  CANCER SCREENING RECOMMENDATIONS:  Given [name]. Fell's personal and family histories, as well as the mosaic pathogenic RAD50 result, we must interpret these results with some caution.  Families with features suggestive of hereditary risk  for cancer tend to have multiple family members with cancer, diagnoses in multiple generations and diagnoses before the age of 25. [name]. [place] family exhibits some of these features. We will re-review [name]. Zeek's results within the context of additional laboratory results, her family history and clinical findings.   An individual's cancer risk and medical management are not determined by genetic test results alone. Overall cancer risk assessment incorporates additional factors, including personal medical history, family history, and any available genetic information that may result in a personalized plan for cancer prevention and surveillance.  FOLLOW-UP: Lastly, we discussed with [name]. [place] that cancer genetics is a rapidly advancing field and it is possible that new genetic tests will be  appropriate for her and/or her family members in the future. We encouraged her to remain in contact with cancer genetics on an annual basis so we can update her personal and family histories and let her know of advances in cancer genetics that may benefit this family.   Our contact number was provided. [name]. [place] questions were answered to her satisfaction, and she knows she is welcome to call us at anytime with additional questions or concerns.   Kaitlyn Kayser, [name], [name] Certified Genetic Counselor [email or web address]

## 2017-09-14 NOTE — Progress Notes (Signed)
HEMATOLOGY/ONCOLOGY CONSULTATION NOTE  Date of Service: 09/17/2017  Patient Care Team: Burnard Bunting, MD as PCP - General (Internal Medicine)  CHIEF COMPLAINTS/PURPOSE OF CONSULTATION:  Variants of unknown significance on genetic testing  HISTORY OF PRESENTING ILLNESS:   [name] is a wonderful 84 y.o. female who has been referred to [place] by Dr Burnard Bunting for evaluation and management of Concerns for AML. She is accompanied today by her niece. The pt reports that she is doing well overall.   The pt reports that she didn't have a specific new clinical reason for undergoing genetic testing other than a fhx of certain cancers,  She notes no current concerns. She notes a family history of acute leukemia in her sister at 71 years old. She notes her sister's son has also had acute leukemia. Her mother's sister had aplastic anemia as well.   She notes that she developed significant short-term memory issues several years ago. She also notes tinnitus.   She notes that she doesn't feel any differently today than she did a year ago. The pt notes that she is not interested in any further work up including blood tests.   Of note prior to the patient's visit today, pt has had a molecular pathology completed on 08/20/17 with results revealing Variant of Uncertain Significance identified in TP53. Possibly mosaic copy number losses identified for the RAD50 and CTNNA1 genes.  On review of systems, pt reports urinary incontinence, short-term memory issues, tinnitus, good energy levels, and denies balance problems, and any other symptoms.    MEDICAL HISTORY:  Past Medical History:  Diagnosis Date  . Arthritis   . Baker cyst    a. 04/2014 on right.  . Basal cell carcinoma of nose   . CAD (coronary artery disease)    Based on CT imaging 12/2013  . Colitis, ischemic ([place])   . Diverticulosis of colon   . Endometrial cancer ([place]) 12/2013  . Esophageal stricture   . Essential hypertension     . Familial tremor   . Family history of cancer   . Gastritis and duodenitis   . 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 History of gallstones   . History of kidney stones   . Hypercholesterolemia   . Obesity   . TIA (transient ischemic attack)   . Varicose veins     SURGICAL HISTORY: Past Surgical History:  Procedure Laterality Date  . CATARACT EXTRACTION Bilateral 2005  . CATARACT EXTRACTION W/ INTRAOCULAR LENS  IMPLANT, BILATERAL    . CHOLECYSTECTOMY    . CHOLECYSTECTOMY, LAPAROSCOPIC  2002  . COLONOSCOPY    . EYE SURGERY    . HYSTEROSCOPY W/D&C N/A 12/18/2013   Procedure: DILATATION AND CURETTAGE /HYSTEROSCOPY;  Surgeon: Gus Height, MD;  Location: [facility];  Service: Gynecology;  Laterality: N/A;  . LUMBAR [place] Right 1998   L1-L2  . LYMPH NODE DISSECTION N/A 02/17/2014   Procedure: LYMPH NODE DISSECTION;  Surgeon: Imagene Gurney A. Alycia Rossetti, MD;  Location: [facility];  Service: Gynecology;  Laterality: N/A;  . ROBOTIC ASSISTED TOTAL HYSTERECTOMY WITH BILATERAL SALPINGO OOPHERECTOMY N/A 02/17/2014   with Lymph node dissection ; Surgeon: Imagene Gurney A. Alycia Rossetti, MD;  Location: [facility];  Service: Gynecology;  Laterality: N/A;  . ROTATOR CUFF REPAIR Bilateral 2003  . SKIN BIOPSY     Four times last 2005 - nose cancer  . TENDON GRAFT  2003   Bone graft  . TONSILLECTOMY AND ADENOIDECTOMY  1958  .  WISDOM TOOTH EXTRACTION      SOCIAL HISTORY: Social History   Socioeconomic History  . Marital status: Single    Spouse name: Not on file  . Number of children: 0  . Years of education: Not on file  . Highest education level: Not on file  Occupational History  . Occupation: Retired  Scientific laboratory technician  . Financial resource strain: Not on file  . Food insecurity:    Worry: Not on file    Inability: Not on file  . Transportation needs:    Medical: Not on file    Non-medical: Not on file  Tobacco Use  . Smoking status: Never Smoker  . Smokeless tobacco: Never  Used  Substance and Sexual Activity  . Alcohol use: No  . Drug use: No  . Sexual activity: Never    Birth control/protection: Post-menopausal  Lifestyle  . Physical activity:    Days per week: Not on file    Minutes per session: Not on file  . Stress: Not on file  Relationships  . Social connections:    Talks on phone: Not on file    Gets together: Not on file    Attends religious service: Not on file    Active member of club or organization: Not on file    Attends meetings of clubs or organizations: Not on file    Relationship status: Not on file  . Intimate partner violence:    Fear of current or ex partner: Not on file    Emotionally abused: Not on file    Physically abused: Not on file    Forced sexual activity: Not on file  Other Topics Concern  . Not on file  Social History Narrative  . Not on file    FAMILY HISTORY: Family History  Problem Relation Age of Onset  . Heart disease Sister   . Diabetes Brother        x 4  . Colon cancer Sister        dx in her 41s  . Diabetes Sister   . Crohn's disease Other        Nephew  . Colon cancer Maternal Aunt   . Colon cancer Maternal Uncle   . Heart disease Brother   . Colon polyps Other        Nephew  . Leukemia Sister   . Diabetes Sister   . Melanoma Other 57       niece  . Lung cancer Other   . Leukemia Other        great niece    ALLERGIES:  is allergic to benadryl [diphenhydramine hcl (sleep)]; ciprofloxacin; pravastatin; vioxx [rofecoxib]; and cephalosporins.  MEDICATIONS:  No current outpatient medications on file.   No current facility-administered medications for this visit.     REVIEW OF SYSTEMS:    10 Point review of Systems was done is negative except as noted above.  PHYSICAL EXAMINATION:  . Vitals:   09/17/17 1131  BP: 131/61  Pulse: 68  Resp: 18  Temp: 98.2 F (36.8 C)  SpO2: 100%   Filed Weights   09/17/17 1131  Weight: 124 lb 8 oz (56.5 kg)   .Body mass index is 21.71  kg/m.  GENERAL:alert, in no acute distress and comfortable SKIN: no acute rashes, no significant lesions EYES: conjunctiva are pink and non-injected, sclera anicteric OROPHARYNX: MMM, no exudates, no oropharyngeal erythema or ulceration NECK: supple, no JVD LYMPH:  no palpable lymphadenopathy in the cervical, axillary or inguinal  regions LUNGS: clear to auscultation b/l with normal respiratory effort HEART: regular rate & rhythm ABDOMEN:  normoactive bowel sounds , non tender, not distended. Extremity: no pedal edema PSYCH: alert & oriented x 3 with fluent speech NEURO: no focal motor/sensory deficits  LABORATORY DATA:  I have reviewed the data as listed  . CBC Latest Ref Rng & Units 10/18/2014 10/17/2014 10/16/2014  WBC 4.0 - 10.5 K/uL 4.3 4.3 8.0  Hemoglobin 12.0 - 15.0 g/dL 9.1(L) 9.1(L) 10.7(L)  Hematocrit 36.0 - 46.0 % 27.2(L) 27.8(L) 32.5(L)  Platelets 150 - 400 K/uL 135(L) 141(L) 183    . CMP Latest Ref Rng & Units 10/18/2014 10/17/2014 10/16/2014  Glucose 65 - 99 mg/dL 82 88 -  BUN 6 - 20 mg/dL 23(H) 29(H) -  Creatinine 0.44 - 1.00 mg/dL 1.26(H) 1.81(H) 1.55(H)  Sodium 135 - 145 mmol/L 140 141 -  Potassium 3.5 - 5.1 mmol/L 4.1 4.3 -  Chloride 101 - 111 mmol/L 115(H) 114(H) -  CO2 22 - 32 mmol/L 22 21(L) -  Calcium 8.9 - 10.3 mg/dL 8.7(L) 8.6(L) -  Total Protein 6.5 - 8.1 g/dL - 4.9(L) -  Total Bilirubin 0.3 - 1.2 mg/dL - 0.4 -  Alkaline Phos 38 - 126 U/L - 37(L) -  AST 15 - 41 U/L - 14(L) -  ALT 14 - 54 U/L - 8(L) -   08/20/17 Molecular Pathology:    RADIOGRAPHIC STUDIES: I have personally reviewed the radiological images as listed and agreed with the findings in the report. No results found.  ASSESSMENT & PLAN:  84 y.o. female with  1. Concerns for AML Patient has no new clinical concerns or labs findings suggestive of AML at this time. Patient with her dementia and memory issues is appropriate not keen on any further evaluation on this front.  2. TP53 Variant  of undetermined significance. PLAN -Discussed patient's most recent molecular pathology from 08/20/17, which revealed Variant of Uncertain Significance identified in TP53. Possibly mosaic copy number losses identified for the RAD50 and CTNNA1 genes (see clinical summary below).  -Discussed that the recent genetics study could not confirm heritability and cannot confirm that she has AML -The pt has no clinical symptoms and no recent lab tests available, and is resistant to further work up and doesn't want to have any blood tests collected. -Will be happy to see pt again as needed -If PCP Dr Reynaldo Minium sees any cause for concern in blood tests, I would be happy to see pt again -no data available from other family members with cancer regarding findings of similar mutation.  3. H/o endometrial carcinoma MSI positive.  -following with Gyn Oncology for followup  All of the patients questions were answered with apparent satisfaction. The patient knows to call the clinic with any problems, questions or concerns.  The toal time spent in the appt was 35 minutes and more than 50% was on counseling and direct patient cares.    Sullivan Lone MD MS AAHIVMS [facility] Hematology/Oncology Physician [facility]  (Office):       [phone] (Work cell):  [phone] (Fax):           [phone]  09/17/2017 12:18 PM  I, Baldwin [place], am acting as a Education administrator for Dr Irene Limbo.   .I have reviewed the above documentation for accuracy and completeness, and I agree with the above. Brunetta Genera MD

## 2017-09-17 ENCOUNTER — Inpatient Hospital Stay: Payer: PPO | Attending: Obstetrics | Admitting: Hematology

## 2017-09-17 VITALS — BP 131/61 | HR 68 | Temp 98.2°F | Resp 18 | Ht 63.5 in | Wt 124.5 lb

## 2017-09-17 DIAGNOSIS — I10 Essential (primary) hypertension: Secondary | ICD-10-CM | POA: Diagnosis not present

## 2017-09-17 DIAGNOSIS — Z1509 Genetic susceptibility to other malignant neoplasm: Secondary | ICD-10-CM | POA: Diagnosis not present

## 2017-09-17 DIAGNOSIS — F0390 Unspecified dementia without behavioral disturbance: Secondary | ICD-10-CM | POA: Diagnosis not present

## 2017-09-17 DIAGNOSIS — C541 Malignant neoplasm of endometrium: Secondary | ICD-10-CM

## 2017-09-17 DIAGNOSIS — Z1379 Encounter for other screening for genetic and chromosomal anomalies: Secondary | ICD-10-CM

## 2017-09-17 DIAGNOSIS — Z806 Family history of leukemia: Secondary | ICD-10-CM

## 2017-09-17 DIAGNOSIS — Z8542 Personal history of malignant neoplasm of other parts of uterus: Secondary | ICD-10-CM | POA: Diagnosis not present

## 2017-09-24 DIAGNOSIS — E7849 Other hyperlipidemia: Secondary | ICD-10-CM | POA: Diagnosis not present

## 2017-09-24 DIAGNOSIS — I10 Essential (primary) hypertension: Secondary | ICD-10-CM | POA: Diagnosis not present

## 2017-09-24 DIAGNOSIS — R82998 Other abnormal findings in urine: Secondary | ICD-10-CM | POA: Diagnosis not present

## 2017-09-26 DIAGNOSIS — Z6822 Body mass index (BMI) 22.0-22.9, adult: Secondary | ICD-10-CM | POA: Diagnosis not present

## 2017-09-26 DIAGNOSIS — Z1389 Encounter for screening for other disorder: Secondary | ICD-10-CM | POA: Diagnosis not present

## 2017-09-26 DIAGNOSIS — D692 Other nonthrombocytopenic purpura: Secondary | ICD-10-CM | POA: Diagnosis not present

## 2017-09-26 DIAGNOSIS — G4709 Other insomnia: Secondary | ICD-10-CM | POA: Diagnosis not present

## 2017-09-26 DIAGNOSIS — I6529 Occlusion and stenosis of unspecified carotid artery: Secondary | ICD-10-CM | POA: Diagnosis not present

## 2017-09-26 DIAGNOSIS — R002 Palpitations: Secondary | ICD-10-CM | POA: Diagnosis not present

## 2017-09-26 DIAGNOSIS — N183 Chronic kidney disease, stage 3 (moderate): Secondary | ICD-10-CM | POA: Diagnosis not present

## 2017-09-26 DIAGNOSIS — Z0000 Encounter for general adult medical examination without abnormal findings: Secondary | ICD-10-CM | POA: Diagnosis not present

## 2017-09-26 DIAGNOSIS — I129 Hypertensive chronic kidney disease with stage 1 through stage 4 chronic kidney disease, or unspecified chronic kidney disease: Secondary | ICD-10-CM | POA: Diagnosis not present

## 2017-09-26 DIAGNOSIS — R0789 Other chest pain: Secondary | ICD-10-CM | POA: Diagnosis not present

## 2017-09-26 DIAGNOSIS — F0390 Unspecified dementia without behavioral disturbance: Secondary | ICD-10-CM | POA: Diagnosis not present

## 2017-09-26 DIAGNOSIS — I10 Essential (primary) hypertension: Secondary | ICD-10-CM | POA: Diagnosis not present

## 2017-09-27 DIAGNOSIS — Z1212 Encounter for screening for malignant neoplasm of rectum: Secondary | ICD-10-CM | POA: Diagnosis not present

## 2018-01-29 NOTE — Progress Notes (Signed)
[place] at [facility]    Progress Note : Established Patient FOLLOW-UP  Originally referred by Dr. Gus Height  CC:  Chief Complaint  Patient presents with  . Endometrial cancer [facility])   GYN Oncologic Summary 1. 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 Genetics -  TP53 c.707A>G (p.Tyr236Cys), and Possibly mosaic copy number losses identified for the RAD50 deletion (entire coding sequence) and CTNNA1 Deletion (entire coding sequence) found on the multicancer panel. This result is indicative of a 5q deletion, which can be seen in myeloid hematolgical conditions.  Recommend that patient be worked up for AML or MDS.   HPI: [name]  is a very nice 84 y.o.  P0  . Interval History:  Recall - She is virginal and nulliparous.   She returns for surveillance, no complaints. No bleeding, no pelvic pain.  Has been shown with genetics to have a ?5qdeletion and should be worked up for leukemia. She has met with Dr. Irene Limbo in Heme-Onc about these findings. No further workup planned.  . Oncologic Course She noted a 4-6 month history of abnormal bloody vaginal discharge. She was seen by her gynecologist Dr Gus Height who performed a transvaginal US on 12/11/13 which showed a 6.6x3.8x3x7cm uterus with an endometrial thickness of 34m. The ovaries were not visualized. 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  02/17/14: Surgery: Total robotic hysterectomy bilateral salpingo-oophorectomy, bilateral pelvic and para-aortic lymph node dissection.  Operative findings: Narrow vaginal introitus. Normal appearing uterus, cervix and adnexa. No obvious evidence of metastatic disease. Normal appearing appendix. Adhesive disease of the omentum to the anterior abdominal wall.  Her final  pathology : 1. Lymph node, biopsy, para-aortic, right - THREE LYMPH NODES, NEGATIVE FOR METASTATIC CARCINOMA (0/3). 2. Lymph node, biopsy, para-aortic, left - ONE LYMPH NODE, NEGATIVE FOR METASTATIC CARCINOMA (0/1). 3. Uterus +/- tubes/ovaries, neoplastic, with cervix - INVASIVE ENDOMETRIOID CARCINOMA, FIGO GRADE II, 2.6 CM INVADING INTO THE OUTER HALF OF THE MYOMETRIUM. - NO EVIDENCE OF ANGIOLYMPHATIC INVASION IDENTIFIED. - BILATERAL OVARIES: BENIGN OVARIAN TISSUE WITH ENDOSALPINGIOSIS, NO ATYPIA OR MALIGNANCY. - BILATERAL FALLOPIAN TUBES: BENIGN FALLOPIAN TUBAL TISSUE, NO EVIDENCE OF ATYPIA OR MALIGNANCY. - CERVIX: BENIGN ENDOCERVICAL MUCOSA, NO DYSPLASIA OR MALIGNANCY. 4. Lymph node, biopsy, right pelvic - SIX LYMPH NODES, NEGATIVE FOR METASTATIC CARCINOMA (0/6). 5. Lymph node, biopsy, left pelvic - TWO LYMPH NODES, NEGATIVE FOR METASTATIC CARCINOMA (0/2).   Thus she was Stage IB grade 2 endometrioid adenocarcinoma  - 66% myometrial invasion.  - 0 out of 12 lymph nodes were involved  - no lymphovascular space involvement.   Per GOG 99 she would benefit from vaginal cuff brachytherapy. She ultimately declined radiation therapy after seeing Dr. KSondra Come    Measurement of disease: Clinical exam and Pap 04/2015 - Pap negative  Radiology:  01/06/2014 - CT AP - [facility] - Lower chest: Clear lung bases. Mild cardiomegaly, accentuated by a pectus excavatum deformity. Right coronary artery atherosclerosis. Cholecystectomy, without biliary ductal dilatation. Mild bilateral renal cortical atrophy. Bilateral multiple renal cysts and too small to characterize lesions. An interpolar left renal lesion measures fluid density centrally but demonstrates minimal increased density in its dependent portion on image 36 of series 2. 1.5 cm.  Extensive colonic diverticulosis with muscular hypertrophy involving the sigmoid. Atherosclerosis, within the aorta and at the origin of both renal arteries. No  retroperitoneal or retrocrural adenopathy. No pelvic adenopathy. Moderate pelvic floor laxity. Retroverted uterus. Central hypoattenuation with surrounding hyper enhancement involving the uterine fundus. This is retroverted, including on sagittal image 53. No gross extension outside of the uterus. Other: No evidence of omental or peritoneal disease. No abdominal pelvic fluid. Musculoskeletal: Degenerative sclerosis involving the symphysis pubis. Right hip osteoarthritis. Lumbar spondylosis.  02/2014 - CXR - negative .   Oncologic History:      Endometrial cancer determined by uterine biopsy ([place])   01/02/2014 Initial Diagnosis    Endometrial cancer determined by uterine biopsy    02/17/2014 Surgery    IA Grade 2, Vaginal cuff brachytherapy per GOG 99 for depth of invasion and grade 2, no LVSI    03/30/2014 -  Radiation Therapy    declined by patient.    08/31/2017 Genetic Testing    TP53 c.707A>G (p.Tyr236Cys), and Possibly mosaic copy number losses identified for the RAD50 deletion (entire coding sequence) and CTNNA1 Deletion (entire coding sequence) found on the multicancer panel.  The Multi-Gene Panel offered by [facility] includes sequencing and/or deletion duplication testing of the following 83 genes: ALK, APC, ATM, AXIN2,BAP1,  BARD1, BLM, BMPR1A, BRCA1, BRCA2, BRIP1, CASR, CDC73, CDH1, CDK4, CDKN1B, CDKN1C, CDKN2A (p14ARF), CDKN2A (p16INK4a), CEBPA, CHEK2, CTNNA1, DICER1, DIS3L2, EGFR (c.2369C>T, p.Thr790Met variant only), EPCAM (Deletion/duplication testing only), FH, FLCN, GATA2, GPC3, GREM1 (Promoter region deletion/duplication testing only), HOXB13 (c.251G>A, p.Gly84Glu), HRAS, KIT, MAX, MEN1, MET, MITF (c.952G>A, p.Glu318Lys variant only), MLH1, MSH2, MSH3, MSH6, MUTYH, NBN, NF1, NF2, NTHL1, PALB2, PDGFRA, PHOX2B, PMS2, POLD1, POLE, POT1, PRKAR1A, PTCH1, PTEN, RAD50, RAD51C, RAD51D, RB1, RECQL4, RET, RUNX1, SDHAF2, SDHA (sequence changes only), SDHB, SDHC, SDHD, SMAD4, SMARCA4,  SMARCB1, SMARCE1, STK11, SUFU, TERT, TERT, TMEM127, TP53, TSC1, TSC2, VHL, WRN and WT1.   The results reported out on Aug 31, 2017.  This result is indicative of a 5q deletion, which can be seen in myeloid hematolgical conditions.  Recommend that patient be worked up for AML or MDS.     Current Meds:  Outpatient Encounter Medications as of 02/01/2018  Medication Sig  . [DISCONTINUED] esomeprazole (NEXIUM) 40 MG capsule Take 1 capsule (40 mg total) by mouth daily.  . [DISCONTINUED] Linaclotide (LINZESS) 145 MCG CAPS Take 1 capsule by mouth daily.   No facility-administered encounter medications on file as of 02/01/2018.     Allergy:  Allergies  Allergen Reactions  . Benadryl [Diphenhydramine Hcl (Sleep)] Other (See Comments)    Light headed, heart racing.   . Ciprofloxacin Other (See Comments)    Leg pains  . Pravastatin Other (See Comments)    Leg cramps  . Vioxx [Rofecoxib] Nausea Only  . Cephalosporins Rash    Social Hx:   Social History   Socioeconomic History  . Marital status: Single    Spouse name: Not on file  . Number of children: 0  . Years of education: Not on file  . Highest education level: Not on file  Occupational History  . Occupation: Retired  Scientific laboratory technician  . Financial resource strain: Not on file  . Food insecurity:    Worry: Not on file    Inability: Not on file  . Transportation needs:    Medical: Not on file    Non-medical: Not on file  Tobacco Use  . Smoking status: Never Smoker  . Smokeless tobacco: Never Used  Substance and Sexual Activity  . Alcohol use: No  . Drug use: No  . Sexual activity: Never    Birth control/protection:  Post-menopausal  Lifestyle  . Physical activity:    Days per week: Not on file    Minutes per session: Not on file  . Stress: Not on file  Relationships  . Social connections:    Talks on phone: Not on file    Gets together: Not on file    Attends religious service: Not on file    Active member of club or  organization: Not on file    Attends meetings of clubs or organizations: Not on file    Relationship status: Not on file  . Intimate partner violence:    Fear of current or ex partner: Not on file    Emotionally abused: Not on file    Physically abused: Not on file    Forced sexual activity: Not on file  Other Topics Concern  . Not on file  Social History Narrative  . Not on file    Past Surgical Hx:  Past Surgical History:  Procedure Laterality Date  . CATARACT EXTRACTION Bilateral 2005  . CATARACT EXTRACTION W/ INTRAOCULAR LENS  IMPLANT, BILATERAL    . CHOLECYSTECTOMY    . CHOLECYSTECTOMY, LAPAROSCOPIC  2002  . COLONOSCOPY    . EYE SURGERY    . HYSTEROSCOPY W/D&C N/A 12/18/2013   Procedure: DILATATION AND CURETTAGE /HYSTEROSCOPY;  Surgeon: Gus Height, MD;  Location: [facility];  Service: Gynecology;  Laterality: N/A;  . LUMBAR [place] Right 1998   L1-L2  . LYMPH NODE DISSECTION N/A 02/17/2014   Procedure: LYMPH NODE DISSECTION;  Surgeon: Imagene Gurney A. Alycia Rossetti, MD;  Location: [facility];  Service: Gynecology;  Laterality: N/A;  . ROBOTIC ASSISTED TOTAL HYSTERECTOMY WITH BILATERAL SALPINGO OOPHERECTOMY N/A 02/17/2014   with Lymph node dissection ; Surgeon: Imagene Gurney A. Alycia Rossetti, MD;  Location: [facility];  Service: Gynecology;  Laterality: N/A;  . ROTATOR CUFF REPAIR Bilateral 2003  . SKIN BIOPSY     Four times last 2005 - nose cancer  . TENDON GRAFT  2003   Bone graft  . TONSILLECTOMY AND ADENOIDECTOMY  1958  . WISDOM TOOTH EXTRACTION      Past Medical Hx:  Past Medical History:  Diagnosis Date  . Arthritis   . Baker cyst    a. 04/2014 on right.  . Basal cell carcinoma of nose   . CAD (coronary artery disease)    Based on CT imaging 12/2013  . Colitis, ischemic ([place])   . Diverticulosis of colon   . Endometrial cancer ([place]) 12/2013  . Esophageal stricture   . Essential hypertension   . Familial tremor   . Gastritis and duodenitis   . Genetic defect    see genetic counselor notes  .  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 History of gallstones   . History of kidney stones   . Hypercholesterolemia   . Obesity   . TIA (transient ischemic attack)   . Varicose veins     Past Gynecological History:   GYNECOLOGIC HISTORY:  No LMP recorded. Patient is postmenopausal. Virginal, nulliparous, menopause in her 65's     Family Hx:  Family History  Problem Relation Age of Onset  . Heart disease Sister   . Diabetes Brother        x 4  . Colon cancer Sister        dx in her 78s  . Diabetes Sister   . Crohn's disease Other        Nephew  . Colon cancer Maternal Aunt   .  Colon cancer Maternal Uncle   . Heart disease Brother   . Colon polyps Other        Nephew  . Leukemia Sister   . Diabetes Sister   . Melanoma Other 58       niece  . Lung cancer Other   . Leukemia Other        great niece    Review of Systems:  Review of Systems  HENT:   Positive for hearing loss and tinnitus.   Psychiatric/Behavioral: The patient is nervous/anxious.   All other systems reviewed and are negative.    Vitals:  Blood pressure 123/70, pulse 65, temperature 97.8 F (36.6 C), temperature source Oral, resp. rate 20, height 5' 3.5" (1.613 m), weight 122 lb (55.3 kg), SpO2 100 %. Body mass index is 21.27 kg/m.  Physical Exam: ECOG PERFORMANCE STATUS: 1 - Symptomatic but completely ambulatory   General :  Well developed, 84 y.o., female in no apparent distress HEENT:  Normocephalic/atraumatic, symmetric, EOMI, eyelids normal Neck:   Supple, no masses.  Lymphatics:  No cervical/ submandibular/ supraclavicular/ infraclavicular/ inguinal adenopathy Respiratory:  Respirations unlabored, no use of accessory muscles CV:   Deferred Breast:  Deferred Musculoskeletal: No CVA tenderness, normal muscle strength. Abdomen:  Soft, non-tender and nondistended. No evidence of hernia. No masses. Extremities:  No lymphedema, no erythema,  non-tender. Skin:   Normal inspection Neuro/Psych:  No focal motor deficit, no abnormal mental status. Normal gait. Normal affect. Alert and oriented to person, place, and time  Genito Urinary: Vulva: Normal external female genitalia.  Bladder/urethra: Urethral meatus normal in size and location. No lesions or   masses, well supported bladder Speculum exam: Vagina: Notable atrophy.  No lesion, no discharge, no bleeding. Cervix: Surgically absent. Bimanual exam:  Uterus: Surgically absent.  Adnexa: No masses. Rectovaginal:  Good tone, no masses, no cul de sac nodularity, no parametrial involvement or nodularity.   Assessment/Plan: 1. Surveillance plan o Q 6 months until 5 years (02/2019) o Pap annually (Q April) given her higher risk for vaginal cuff recurrence (declined vaginal brachy with HIR disease) 2. Genetic results per genetic counseling, PCP and now Dr. Irene Limbo has met with them.    Isabel Caprice, MD  02/01/2018, 1:20 PM   Cc: Gus Height, MD (referring Ob/Gyn) Burnard Bunting, MD (PCP)

## 2018-02-01 ENCOUNTER — Encounter: Payer: Self-pay | Admitting: Obstetrics

## 2018-02-01 ENCOUNTER — Inpatient Hospital Stay: Payer: PPO | Attending: Obstetrics | Admitting: Obstetrics

## 2018-02-01 VITALS — BP 123/70 | HR 65 | Temp 97.8°F | Resp 20 | Ht 63.5 in | Wt 122.0 lb

## 2018-02-01 DIAGNOSIS — Z90710 Acquired absence of both cervix and uterus: Secondary | ICD-10-CM | POA: Insufficient documentation

## 2018-02-01 DIAGNOSIS — C541 Malignant neoplasm of endometrium: Secondary | ICD-10-CM | POA: Diagnosis not present

## 2018-02-01 DIAGNOSIS — Z90722 Acquired absence of ovaries, bilateral: Secondary | ICD-10-CM | POA: Insufficient documentation

## 2018-02-01 NOTE — Patient Instructions (Addendum)
1. RTC 6 months for pap and pelvic or sooner if needed. 2. Please call for any questions or concerns.

## 2018-02-13 ENCOUNTER — Telehealth: Payer: Self-pay | Admitting: *Deleted

## 2018-02-13 NOTE — Telephone Encounter (Signed)
Called and spoke with Ivin Booty, moved the patient form 4/22 to 4/24

## 2018-03-11 DIAGNOSIS — H9319 Tinnitus, unspecified ear: Secondary | ICD-10-CM | POA: Diagnosis not present

## 2018-03-11 DIAGNOSIS — R413 Other amnesia: Secondary | ICD-10-CM | POA: Diagnosis not present

## 2018-03-11 DIAGNOSIS — I10 Essential (primary) hypertension: Secondary | ICD-10-CM | POA: Diagnosis not present

## 2018-03-11 DIAGNOSIS — E7849 Other hyperlipidemia: Secondary | ICD-10-CM | POA: Diagnosis not present

## 2018-03-11 DIAGNOSIS — M1990 Unspecified osteoarthritis, unspecified site: Secondary | ICD-10-CM | POA: Diagnosis not present

## 2018-03-11 DIAGNOSIS — F0390 Unspecified dementia without behavioral disturbance: Secondary | ICD-10-CM | POA: Diagnosis not present

## 2018-03-13 ENCOUNTER — Other Ambulatory Visit (HOSPITAL_COMMUNITY): Payer: Self-pay | Admitting: Internal Medicine

## 2018-03-13 DIAGNOSIS — R413 Other amnesia: Secondary | ICD-10-CM

## 2018-03-22 ENCOUNTER — Ambulatory Visit (HOSPITAL_COMMUNITY)
Admission: RE | Admit: 2018-03-22 | Discharge: 2018-03-22 | Disposition: A | Discharge status: Home / Self Care | Payer: PPO | Source: Ambulatory Visit | Attending: Internal Medicine | Admitting: Internal Medicine

## 2018-03-22 DIAGNOSIS — R413 Other amnesia: Secondary | ICD-10-CM | POA: Insufficient documentation

## 2018-03-22 DIAGNOSIS — I639 Cerebral infarction, unspecified: Secondary | ICD-10-CM | POA: Diagnosis not present

## 2018-03-22 DIAGNOSIS — G319 Degenerative disease of nervous system, unspecified: Secondary | ICD-10-CM | POA: Diagnosis not present

## 2018-03-22 DIAGNOSIS — I6782 Cerebral ischemia: Secondary | ICD-10-CM | POA: Diagnosis not present

## 2018-06-10 ENCOUNTER — Telehealth: Payer: Self-pay | Admitting: *Deleted

## 2018-06-10 NOTE — Telephone Encounter (Signed)
Called and spoke with the patient's daughter, moved the appt. Appt moved from Dr. Gerarda Fraction on 4/24 to Dr. Skeet Latch on 5/28. Patient's daughter stated that the patient would like a female provider. They were offered 4/23 and 5/14, but no appts that worked with they schedule.

## 2018-07-18 ENCOUNTER — Telehealth: Payer: Self-pay | Admitting: *Deleted

## 2018-07-18 NOTE — Telephone Encounter (Signed)
Returned the daughters call and left message with appointment information

## 2018-07-31 ENCOUNTER — Ambulatory Visit: Payer: PPO | Admitting: Obstetrics

## 2018-08-02 ENCOUNTER — Ambulatory Visit: Payer: PPO | Admitting: Obstetrics

## 2018-08-27 ENCOUNTER — Telehealth: Payer: Self-pay | Admitting: *Deleted

## 2018-08-27 NOTE — Telephone Encounter (Addendum)
Patient called and canceled appt for 5/28. "Patient doesn't want to come to the visits anymore", per family. Explained to the family that she would be released this November, to have her PCP keep an eye on the patient and to call us with an issues.

## 2018-09-05 ENCOUNTER — Ambulatory Visit: Payer: PPO | Admitting: Gynecologic Oncology

## 2018-09-30 DIAGNOSIS — I10 Essential (primary) hypertension: Secondary | ICD-10-CM | POA: Diagnosis not present

## 2018-10-01 DIAGNOSIS — I10 Essential (primary) hypertension: Secondary | ICD-10-CM | POA: Diagnosis not present

## 2018-10-01 DIAGNOSIS — R82998 Other abnormal findings in urine: Secondary | ICD-10-CM | POA: Diagnosis not present

## 2018-10-07 DIAGNOSIS — N184 Chronic kidney disease, stage 4 (severe): Secondary | ICD-10-CM | POA: Diagnosis not present

## 2018-10-07 DIAGNOSIS — Z0000 Encounter for general adult medical examination without abnormal findings: Secondary | ICD-10-CM | POA: Diagnosis not present

## 2018-10-07 DIAGNOSIS — D638 Anemia in other chronic diseases classified elsewhere: Secondary | ICD-10-CM | POA: Diagnosis not present

## 2018-10-07 DIAGNOSIS — F0390 Unspecified dementia without behavioral disturbance: Secondary | ICD-10-CM | POA: Diagnosis not present

## 2018-10-07 DIAGNOSIS — I6529 Occlusion and stenosis of unspecified carotid artery: Secondary | ICD-10-CM | POA: Diagnosis not present

## 2018-10-07 DIAGNOSIS — H9319 Tinnitus, unspecified ear: Secondary | ICD-10-CM | POA: Diagnosis not present

## 2018-10-07 DIAGNOSIS — I10 Essential (primary) hypertension: Secondary | ICD-10-CM | POA: Diagnosis not present

## 2018-10-07 DIAGNOSIS — G4700 Insomnia, unspecified: Secondary | ICD-10-CM | POA: Diagnosis not present

## 2018-10-07 DIAGNOSIS — Z1331 Encounter for screening for depression: Secondary | ICD-10-CM | POA: Diagnosis not present

## 2018-10-07 DIAGNOSIS — I129 Hypertensive chronic kidney disease with stage 1 through stage 4 chronic kidney disease, or unspecified chronic kidney disease: Secondary | ICD-10-CM | POA: Diagnosis not present

## 2018-10-07 DIAGNOSIS — K219 Gastro-esophageal reflux disease without esophagitis: Secondary | ICD-10-CM | POA: Diagnosis not present

## 2018-10-07 DIAGNOSIS — M1990 Unspecified osteoarthritis, unspecified site: Secondary | ICD-10-CM | POA: Diagnosis not present

## 2018-10-07 DIAGNOSIS — R002 Palpitations: Secondary | ICD-10-CM | POA: Diagnosis not present

## 2018-10-07 DIAGNOSIS — Z1339 Encounter for screening examination for other mental health and behavioral disorders: Secondary | ICD-10-CM | POA: Diagnosis not present

## 2018-10-10 ENCOUNTER — Emergency Department (HOSPITAL_COMMUNITY): Payer: PPO

## 2018-10-10 ENCOUNTER — Encounter (HOSPITAL_COMMUNITY): Payer: Self-pay

## 2018-10-10 ENCOUNTER — Other Ambulatory Visit: Payer: Self-pay

## 2018-10-10 ENCOUNTER — Emergency Department (HOSPITAL_COMMUNITY)
Admission: EM | Admit: 2018-10-10 | Discharge: 2018-10-10 | Disposition: A | Discharge status: Home / Self Care | Payer: PPO | Attending: Emergency Medicine | Admitting: Emergency Medicine

## 2018-10-10 DIAGNOSIS — W19XXXA Unspecified fall, initial encounter: Secondary | ICD-10-CM | POA: Diagnosis not present

## 2018-10-10 DIAGNOSIS — S0081XA Abrasion of other part of head, initial encounter: Secondary | ICD-10-CM | POA: Diagnosis not present

## 2018-10-10 DIAGNOSIS — Y9301 Activity, walking, marching and hiking: Secondary | ICD-10-CM | POA: Insufficient documentation

## 2018-10-10 DIAGNOSIS — W010XXA Fall on same level from slipping, tripping and stumbling without subsequent striking against object, initial encounter: Secondary | ICD-10-CM | POA: Diagnosis not present

## 2018-10-10 DIAGNOSIS — S0083XA Contusion of other part of head, initial encounter: Secondary | ICD-10-CM | POA: Diagnosis not present

## 2018-10-10 DIAGNOSIS — I2510 Atherosclerotic heart disease of native coronary artery without angina pectoris: Secondary | ICD-10-CM | POA: Insufficient documentation

## 2018-10-10 DIAGNOSIS — R609 Edema, unspecified: Secondary | ICD-10-CM | POA: Diagnosis not present

## 2018-10-10 DIAGNOSIS — Y92009 Unspecified place in unspecified non-institutional (private) residence as the place of occurrence of the external cause: Secondary | ICD-10-CM | POA: Diagnosis not present

## 2018-10-10 DIAGNOSIS — R51 Headache: Secondary | ICD-10-CM | POA: Diagnosis not present

## 2018-10-10 DIAGNOSIS — Z23 Encounter for immunization: Secondary | ICD-10-CM | POA: Insufficient documentation

## 2018-10-10 DIAGNOSIS — Y999 Unspecified external cause status: Secondary | ICD-10-CM | POA: Diagnosis not present

## 2018-10-10 DIAGNOSIS — Z8542 Personal history of malignant neoplasm of other parts of uterus: Secondary | ICD-10-CM | POA: Insufficient documentation

## 2018-10-10 DIAGNOSIS — S0990XA Unspecified injury of head, initial encounter: Secondary | ICD-10-CM | POA: Diagnosis present

## 2018-10-10 DIAGNOSIS — Z8673 Personal history of transient ischemic attack (TIA), and cerebral infarction without residual deficits: Secondary | ICD-10-CM | POA: Diagnosis not present

## 2018-10-10 DIAGNOSIS — I959 Hypotension, unspecified: Secondary | ICD-10-CM | POA: Diagnosis not present

## 2018-10-10 DIAGNOSIS — S199XXA Unspecified injury of neck, initial encounter: Secondary | ICD-10-CM | POA: Diagnosis not present

## 2018-10-10 MED ORDER — TETANUS-DIPHTH-ACELL PERTUSSIS 5-2.5-18.5 LF-MCG/0.5 IM SUSP
0.5000 mL | Freq: Once | INTRAMUSCULAR | Status: AC
  Administered 2018-10-10: 20:00:00 0.5 mL via INTRAMUSCULAR
  Filled 2018-10-10: qty 0.5

## 2018-10-10 NOTE — ED Notes (Signed)
Bed: WA01 Expected date:  Expected time:  Means of arrival:  Comments: Ems fall

## 2018-10-10 NOTE — ED Provider Notes (Signed)
[facility] DEPT Provider Note   CSN: [id number] Arrival date & time: 10/10/18  1828     History   Chief Complaint Chief Complaint  Patient presents with   Fall    HPI [name] is a 85 y.o. female.     Patient tripped and fell and hit her left side of her face.  Questionable loss of consciousness  The history is provided by the patient.  Fall This is a new problem. The current episode started less than 1 hour ago. The problem occurs rarely. The problem has been resolved. Pertinent negatives include no chest pain, no abdominal pain and no headaches. Nothing aggravates the symptoms. Nothing relieves the symptoms. She has tried nothing for the symptoms.    Past Medical History:  Diagnosis Date   Arthritis    Baker cyst    a. 04/2014 on right.   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    History of gallstones    History of kidney stones    Hypercholesterolemia    Obesity    TIA (transient ischemic attack)    Varicose veins     Patient Active Problem List   Diagnosis Date Noted   Genetic testing 09/05/2017   Family history of colon cancer    Family history of melanoma    Family history of leukemia    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    Bradycardia 10/18/2014   AKI (acute kidney injury) ([place]) 10/17/2014   Essential hypertension 10/17/2014   Syncope and collapse 10/16/2014   Endometrial cancer determined by uterine biopsy ([place]) 01/02/2014   Constipation, chronic 06/13/2011   H/O diverticulitis of colon  06/13/2011   Gastritis 06/05/2011   Family history of malignant neoplasm of gastrointestinal tract 05/25/2011   Esophageal reflux 05/25/2011   Diverticulosis of colon (without mention of hemorrhage) 04/26/2011   Abdominal pain 04/26/2011   Constipation, outlet dysfunction 04/26/2011    Past Surgical History:  Procedure Laterality Date   CATARACT EXTRACTION Bilateral 2005   CATARACT EXTRACTION W/ INTRAOCULAR LENS  IMPLANT, BILATERAL     CHOLECYSTECTOMY     CHOLECYSTECTOMY, LAPAROSCOPIC  2002   COLONOSCOPY     EYE SURGERY     HYSTEROSCOPY W/D&C N/A 12/18/2013   Procedure: DILATATION AND CURETTAGE /HYSTEROSCOPY;  Surgeon: Gus Height, MD;  Location: [facility];  Service: Gynecology;  Laterality: N/A;   LUMBAR DISC SURGERY Right 1998   L1-L2   LYMPH NODE DISSECTION N/A 02/17/2014   Procedure: LYMPH NODE DISSECTION;  Surgeon: Imagene Gurney A. Alycia Rossetti, MD;  Location: [facility];  Service: Gynecology;  Laterality: N/A;   ROBOTIC ASSISTED TOTAL HYSTERECTOMY WITH BILATERAL SALPINGO OOPHERECTOMY N/A 02/17/2014   with Lymph node dissection ; Surgeon: Imagene Gurney A. Alycia Rossetti, MD;  Location: [facility];  Service: Gynecology;  Laterality: N/A;   ROTATOR CUFF REPAIR Bilateral 2003   SKIN BIOPSY     Four times last 2005 - nose cancer   TENDON GRAFT  2003   Bone graft   TONSILLECTOMY AND ADENOIDECTOMY  1958   WISDOM TOOTH  EXTRACTION       OB History   No obstetric history on file.      Home Medications    Prior to Admission medications   Medication Sig Start Date End Date Taking? Authorizing Provider  esomeprazole (NEXIUM) 40 MG capsule Take 1 capsule (40 mg total) by mouth daily. 06/13/11 06/23/11  Sable Feil, MD  Linaclotide Rolan Lipa) 145 MCG CAPS Take 1 capsule by mouth daily. 06/13/11 06/23/11  Sable Feil, MD    Family History Family History  Problem Relation Age of Onset   Heart disease Sister    Diabetes Brother        x 4   Colon cancer Sister        dx in her 54s    Diabetes Sister    Crohn's disease Other        Nephew   Colon cancer Maternal Aunt    Colon cancer Maternal Uncle    Heart disease Brother    Colon polyps Other        Nephew   Leukemia Sister    Diabetes Sister    Melanoma Other 26       niece   Lung cancer Other    Leukemia Other        great niece    Social History Social History   Tobacco Use   Smoking status: Never Smoker   Smokeless tobacco: Never Used  Substance Use Topics   Alcohol use: No   Drug use: No     Allergies   Benadryl [diphenhydramine hcl (sleep)], Ciprofloxacin, Pravastatin, Vioxx [rofecoxib], and Cephalosporins   Review of Systems Review of Systems  Constitutional: Negative for appetite change and fatigue.  HENT: Negative for congestion, ear discharge and sinus pressure.        Facial pain  Eyes: Negative for discharge.  Respiratory: Negative for cough.   Cardiovascular: Negative for chest pain.  Gastrointestinal: Negative for abdominal pain and diarrhea.  Genitourinary: Negative for frequency and hematuria.  Musculoskeletal: Negative for back pain.  Skin: Negative for rash.  Neurological: Negative for seizures and headaches.  Psychiatric/Behavioral: Negative for hallucinations.     Physical Exam Updated Vital Signs BP 113/83    Pulse (!) 56    Temp 98.5 F (36.9 C) (Oral)    Resp 14    Ht 5' 3.5" (1.613 m)    Wt 47.6 kg    SpO2 100%    BMI 18.31 kg/m   Physical Exam Vitals signs and nursing note reviewed.  Constitutional:      Appearance: She is well-developed.  HENT:     Head: Normocephalic.     Comments: Abrasions to left side of face    Nose: Nose normal.  Eyes:     General: No scleral icterus.    Conjunctiva/sclera: Conjunctivae normal.  Neck:     Musculoskeletal: Neck supple.     Thyroid: No thyromegaly.  Cardiovascular:     Rate and Rhythm: Normal rate and regular rhythm.     Heart sounds: No murmur. No friction rub. No gallop.   Pulmonary:     Breath  sounds: No stridor. No wheezing or rales.  Chest:     Chest wall: No tenderness.  Abdominal:     General: There is no distension.     Tenderness: There is no abdominal tenderness. There is no rebound.  Musculoskeletal: Normal range of motion.  Lymphadenopathy:     Cervical: No cervical adenopathy.  Skin:  Findings: No erythema or rash.  Neurological:     Mental Status: She is oriented to person, place, and time.     Motor: No abnormal muscle tone.     Coordination: Coordination normal.  Psychiatric:        Behavior: Behavior normal.      ED Treatments / Results  Labs (all labs ordered are listed, but only abnormal results are displayed) Labs Reviewed - No data to display  EKG None  Radiology Ct Head Wo Contrast  Result Date: 10/10/2018 CLINICAL DATA:  Fall with left facial bruising EXAM: CT HEAD WITHOUT CONTRAST CT MAXILLOFACIAL WITHOUT CONTRAST CT CERVICAL SPINE WITHOUT CONTRAST TECHNIQUE: Multidetector CT imaging of the head, cervical spine, and maxillofacial structures were performed using the standard protocol without intravenous contrast. Multiplanar CT image reconstructions of the cervical spine and maxillofacial structures were also generated. COMPARISON:  MRI 03/22/2018, CT brain 10/16/2014 FINDINGS: CT HEAD FINDINGS Brain: No acute territorial infarction, hemorrhage, or intracranial mass. Atrophy and mild small vessel ischemic changes of the white matter. Stable ventricle size. Vascular: No hyperdense vessels.  Carotid vascular calcification Skull: Normal. Negative for fracture or focal lesion. Other: None CT MAXILLOFACIAL FINDINGS Osseous: No acute nasal bone fracture. No mandibular fracture. Chronic left TMJ disease. Pterygoid plates and zygomatic arches are intact. Mastoid air cells are clear. Orbits: Negative. No traumatic or inflammatory finding. Sinuses: Clear. Soft tissues: Mild left periorbital and premalar soft tissue swelling CT CERVICAL SPINE FINDINGS Alignment:  Trace retrolisthesis C3 on C4 and C4 on C5. Straightening of the cervical spine. Facet alignment within normal limits. Skull base and vertebrae: No acute fracture. No primary bone lesion or focal pathologic process. Soft tissues and spinal canal: No prevertebral fluid or swelling. No visible canal hematoma. Disc levels: Moderate severe degenerative changes C3-C4, C4-C5 and C5-C6 with moderate degenerative changes at C6-C7. Multiple level bilateral facet degenerative change. Prominent degenerative changes at C1-C2 articulation with calcified pannus around the tip of the dens. Upper chest: Negative. Other: Multiple low-density nodules in the right lobe of the thyroid measuring up to 2.2 cm. IMPRESSION: 1. No CT evidence for acute intracranial abnormality. Atrophy and mild small vessel ischemic changes of the white matter 2. Straightening the cervical spine with trace retrolisthesis C3 on C4 and C4 on C5. Diffuse degenerative change without acute fracture 3. No acute facial bone fracture 4. Multiple right thyroid nodules measuring up to 2.2 cm. Suggest correlation with nonemergent thyroid ultrasound if not already performed. Electronically Signed   By: Donavan Foil M.D.   On: 10/10/2018 21:19   Ct Cervical Spine Wo Contrast  Result Date: 10/10/2018 CLINICAL DATA:  Fall with left facial bruising EXAM: CT HEAD WITHOUT CONTRAST CT MAXILLOFACIAL WITHOUT CONTRAST CT CERVICAL SPINE WITHOUT CONTRAST TECHNIQUE: Multidetector CT imaging of the head, cervical spine, and maxillofacial structures were performed using the standard protocol without intravenous contrast. Multiplanar CT image reconstructions of the cervical spine and maxillofacial structures were also generated. COMPARISON:  MRI 03/22/2018, CT brain 10/16/2014 FINDINGS: CT HEAD FINDINGS Brain: No acute territorial infarction, hemorrhage, or intracranial mass. Atrophy and mild small vessel ischemic changes of the white matter. Stable ventricle size. Vascular: No  hyperdense vessels.  Carotid vascular calcification Skull: Normal. Negative for fracture or focal lesion. Other: None CT MAXILLOFACIAL FINDINGS Osseous: No acute nasal bone fracture. No mandibular fracture. Chronic left TMJ disease. Pterygoid plates and zygomatic arches are intact. Mastoid air cells are clear. Orbits: Negative. No traumatic or inflammatory finding. Sinuses: Clear. Soft tissues: Mild left  periorbital and premalar soft tissue swelling CT CERVICAL SPINE FINDINGS Alignment: Trace retrolisthesis C3 on C4 and C4 on C5. Straightening of the cervical spine. Facet alignment within normal limits. Skull base and vertebrae: No acute fracture. No primary bone lesion or focal pathologic process. Soft tissues and spinal canal: No prevertebral fluid or swelling. No visible canal hematoma. Disc levels: Moderate severe degenerative changes C3-C4, C4-C5 and C5-C6 with moderate degenerative changes at C6-C7. Multiple level bilateral facet degenerative change. Prominent degenerative changes at C1-C2 articulation with calcified pannus around the tip of the dens. Upper chest: Negative. Other: Multiple low-density nodules in the right lobe of the thyroid measuring up to 2.2 cm. IMPRESSION: 1. No CT evidence for acute intracranial abnormality. Atrophy and mild small vessel ischemic changes of the white matter 2. Straightening the cervical spine with trace retrolisthesis C3 on C4 and C4 on C5. Diffuse degenerative change without acute fracture 3. No acute facial bone fracture 4. Multiple right thyroid nodules measuring up to 2.2 cm. Suggest correlation with nonemergent thyroid ultrasound if not already performed. Electronically Signed   By: Donavan Foil M.D.   On: 10/10/2018 21:19   Ct Maxillofacial Wo Contrast  Result Date: 10/10/2018 CLINICAL DATA:  Fall with left facial bruising EXAM: CT HEAD WITHOUT CONTRAST CT MAXILLOFACIAL WITHOUT CONTRAST CT CERVICAL SPINE WITHOUT CONTRAST TECHNIQUE: Multidetector CT imaging of  the head, cervical spine, and maxillofacial structures were performed using the standard protocol without intravenous contrast. Multiplanar CT image reconstructions of the cervical spine and maxillofacial structures were also generated. COMPARISON:  MRI 03/22/2018, CT brain 10/16/2014 FINDINGS: CT HEAD FINDINGS Brain: No acute territorial infarction, hemorrhage, or intracranial mass. Atrophy and mild small vessel ischemic changes of the white matter. Stable ventricle size. Vascular: No hyperdense vessels.  Carotid vascular calcification Skull: Normal. Negative for fracture or focal lesion. Other: None CT MAXILLOFACIAL FINDINGS Osseous: No acute nasal bone fracture. No mandibular fracture. Chronic left TMJ disease. Pterygoid plates and zygomatic arches are intact. Mastoid air cells are clear. Orbits: Negative. No traumatic or inflammatory finding. Sinuses: Clear. Soft tissues: Mild left periorbital and premalar soft tissue swelling CT CERVICAL SPINE FINDINGS Alignment: Trace retrolisthesis C3 on C4 and C4 on C5. Straightening of the cervical spine. Facet alignment within normal limits. Skull base and vertebrae: No acute fracture. No primary bone lesion or focal pathologic process. Soft tissues and spinal canal: No prevertebral fluid or swelling. No visible canal hematoma. Disc levels: Moderate severe degenerative changes C3-C4, C4-C5 and C5-C6 with moderate degenerative changes at C6-C7. Multiple level bilateral facet degenerative change. Prominent degenerative changes at C1-C2 articulation with calcified pannus around the tip of the dens. Upper chest: Negative. Other: Multiple low-density nodules in the right lobe of the thyroid measuring up to 2.2 cm. IMPRESSION: 1. No CT evidence for acute intracranial abnormality. Atrophy and mild small vessel ischemic changes of the white matter 2. Straightening the cervical spine with trace retrolisthesis C3 on C4 and C4 on C5. Diffuse degenerative change without acute fracture  3. No acute facial bone fracture 4. Multiple right thyroid nodules measuring up to 2.2 cm. Suggest correlation with nonemergent thyroid ultrasound if not already performed. Electronically Signed   By: Donavan Foil M.D.   On: 10/10/2018 21:19    Procedures Procedures (including critical care time)  Medications Ordered in ED Medications  Tdap (BOOSTRIX) injection 0.5 mL (0.5 mLs Intramuscular Given 10/10/18 1951)     Initial Impression / Assessment and Plan / ED Course  I have reviewed the triage  vital signs and the nursing notes.  Pertinent labs & imaging results that were available during my care of the patient were reviewed by me and considered in my medical decision making (see chart for details).       CT scan unremarkable.  Patient has abrasions to face and contusion to face.  She will follow-up with her PCP Final Clinical Impressions(s) / ED Diagnoses   Final diagnoses:  Fall, initial encounter    ED Discharge Orders    None       Milton Ferguson, MD 10/10/18 2200

## 2018-10-10 NOTE — Discharge Instructions (Addendum)
Clean area on face twice a day gently with soap and water and follow-up with your doctor if any problems.  Take Tylenol for pain

## 2018-10-10 NOTE — ED Triage Notes (Signed)
Pt arrived vis EMS lost her balance and fell on to left side pf body pt has some left side face bruising abrasion. Pt denies LOC, blurred vision, pt at scene was able to ambulated. Pt wanted to be evaluated. Pt is HOH **Pt lives alone. Pt brother is waiting for pt in parking lot    EMS v/s 130/66, HR 60, RR 20, O2 98% RA  CBG 145

## 2018-11-26 DIAGNOSIS — H903 Sensorineural hearing loss, bilateral: Secondary | ICD-10-CM | POA: Diagnosis not present

## 2018-12-11 DIAGNOSIS — H903 Sensorineural hearing loss, bilateral: Secondary | ICD-10-CM | POA: Diagnosis not present

## 2019-02-17 DIAGNOSIS — D638 Anemia in other chronic diseases classified elsewhere: Secondary | ICD-10-CM | POA: Diagnosis not present

## 2019-02-17 DIAGNOSIS — I129 Hypertensive chronic kidney disease with stage 1 through stage 4 chronic kidney disease, or unspecified chronic kidney disease: Secondary | ICD-10-CM | POA: Diagnosis not present

## 2019-02-17 DIAGNOSIS — H9193 Unspecified hearing loss, bilateral: Secondary | ICD-10-CM | POA: Diagnosis not present

## 2019-02-17 DIAGNOSIS — N184 Chronic kidney disease, stage 4 (severe): Secondary | ICD-10-CM | POA: Diagnosis not present

## 2019-02-17 DIAGNOSIS — F0390 Unspecified dementia without behavioral disturbance: Secondary | ICD-10-CM | POA: Diagnosis not present

## 2019-02-17 DIAGNOSIS — I6529 Occlusion and stenosis of unspecified carotid artery: Secondary | ICD-10-CM | POA: Diagnosis not present

## 2019-05-13 DIAGNOSIS — H9113 Presbycusis, bilateral: Secondary | ICD-10-CM | POA: Diagnosis not present

## 2019-05-13 DIAGNOSIS — H6123 Impacted cerumen, bilateral: Secondary | ICD-10-CM | POA: Diagnosis not present

## 2019-05-13 DIAGNOSIS — J342 Deviated nasal septum: Secondary | ICD-10-CM | POA: Diagnosis not present

## 2019-05-13 DIAGNOSIS — F0390 Unspecified dementia without behavioral disturbance: Secondary | ICD-10-CM | POA: Diagnosis not present

## 2019-05-19 ENCOUNTER — Ambulatory Visit: Payer: PPO | Attending: Internal Medicine

## 2019-05-19 DIAGNOSIS — Z23 Encounter for immunization: Secondary | ICD-10-CM | POA: Insufficient documentation

## 2019-05-19 NOTE — Progress Notes (Signed)
   Covid-19 Vaccination Clinic  Name:  [name]    MRN: [id number] DOB: [birth year]  05/19/2019  [name] was observed post Covid-19 immunization for 15 minutes without incidence. She was provided with Vaccine Information Sheet and instruction to access the V-Safe system.   [name] was instructed to call 911 with any severe reactions post vaccine: Marland Kitchen Difficulty breathing  . Swelling of your face and throat  . A fast heartbeat  . A bad rash all over your body  . Dizziness and weakness    Immunizations Administered    Name Date Dose VIS Date Route   Pfizer COVID-19 Vaccine 05/19/2019  2:51 PM 0.3 mL 03/21/2019 Intramuscular   Manufacturer: [place]   Lot: [id number]   [place]: [id number]

## 2019-05-27 DIAGNOSIS — H903 Sensorineural hearing loss, bilateral: Secondary | ICD-10-CM | POA: Diagnosis not present

## 2019-06-09 DIAGNOSIS — K219 Gastro-esophageal reflux disease without esophagitis: Secondary | ICD-10-CM | POA: Diagnosis not present

## 2019-06-09 DIAGNOSIS — E785 Hyperlipidemia, unspecified: Secondary | ICD-10-CM | POA: Diagnosis not present

## 2019-06-09 DIAGNOSIS — I129 Hypertensive chronic kidney disease with stage 1 through stage 4 chronic kidney disease, or unspecified chronic kidney disease: Secondary | ICD-10-CM | POA: Diagnosis not present

## 2019-06-09 DIAGNOSIS — N184 Chronic kidney disease, stage 4 (severe): Secondary | ICD-10-CM | POA: Diagnosis not present

## 2019-06-09 DIAGNOSIS — F0390 Unspecified dementia without behavioral disturbance: Secondary | ICD-10-CM | POA: Diagnosis not present

## 2019-06-09 DIAGNOSIS — D692 Other nonthrombocytopenic purpura: Secondary | ICD-10-CM | POA: Diagnosis not present

## 2019-06-09 DIAGNOSIS — I6529 Occlusion and stenosis of unspecified carotid artery: Secondary | ICD-10-CM | POA: Diagnosis not present

## 2019-06-12 DIAGNOSIS — H903 Sensorineural hearing loss, bilateral: Secondary | ICD-10-CM | POA: Diagnosis not present

## 2019-06-13 ENCOUNTER — Ambulatory Visit: Payer: PPO | Attending: Internal Medicine

## 2019-06-13 DIAGNOSIS — Z23 Encounter for immunization: Secondary | ICD-10-CM | POA: Insufficient documentation

## 2019-06-13 NOTE — Progress Notes (Signed)
   Covid-19 Vaccination Clinic  Name:  RAMANI RIVA    MRN: [id number] DOB: 22-Oct-1932  06/13/2019  Ms. Drapeau was observed post Covid-19 immunization for 15 minutes without incident. She was provided with Vaccine Information Sheet and instruction to access the V-Safe system.   Ms. Sitzmann was instructed to call 911 with any severe reactions post vaccine: Marland Kitchen Difficulty breathing  . Swelling of face and throat  . A fast heartbeat  . A bad rash all over body  . Dizziness and weakness   Immunizations Administered    Name Date Dose VIS Date Route   Pfizer COVID-19 Vaccine 06/13/2019 10:07 AM 0.3 mL 03/21/2019 Intramuscular   Manufacturer: [place]   Lot: [id number]   [place]: [id number]

## 2019-07-07 DIAGNOSIS — N184 Chronic kidney disease, stage 4 (severe): Secondary | ICD-10-CM | POA: Diagnosis not present

## 2019-07-07 DIAGNOSIS — I6529 Occlusion and stenosis of unspecified carotid artery: Secondary | ICD-10-CM | POA: Diagnosis not present

## 2019-07-07 DIAGNOSIS — F0390 Unspecified dementia without behavioral disturbance: Secondary | ICD-10-CM | POA: Diagnosis not present

## 2019-07-07 DIAGNOSIS — I129 Hypertensive chronic kidney disease with stage 1 through stage 4 chronic kidney disease, or unspecified chronic kidney disease: Secondary | ICD-10-CM | POA: Diagnosis not present

## 2019-07-07 DIAGNOSIS — Z7189 Other specified counseling: Secondary | ICD-10-CM | POA: Diagnosis not present

## 2019-10-29 DIAGNOSIS — E7849 Other hyperlipidemia: Secondary | ICD-10-CM | POA: Diagnosis not present

## 2019-11-05 DIAGNOSIS — F0390 Unspecified dementia without behavioral disturbance: Secondary | ICD-10-CM | POA: Diagnosis not present

## 2019-11-05 DIAGNOSIS — M1990 Unspecified osteoarthritis, unspecified site: Secondary | ICD-10-CM | POA: Diagnosis not present

## 2019-11-05 DIAGNOSIS — Z8679 Personal history of other diseases of the circulatory system: Secondary | ICD-10-CM | POA: Diagnosis not present

## 2019-11-05 DIAGNOSIS — Z0000 Encounter for general adult medical examination without abnormal findings: Secondary | ICD-10-CM | POA: Diagnosis not present

## 2019-11-05 DIAGNOSIS — N184 Chronic kidney disease, stage 4 (severe): Secondary | ICD-10-CM | POA: Diagnosis not present

## 2019-11-23 IMAGING — CR DG KNEE COMPLETE 4+V*R*
4 series · 4 of 4 positions shown · non-contrast
Comparison: Right knee films of 10/16/2014

CLINICAL DATA: Fell yesterday landing on the knees with swelling
and pain

EXAM:
RIGHT KNEE - COMPLETE 4+ VIEW

[t knee obl right (1 of 3)]
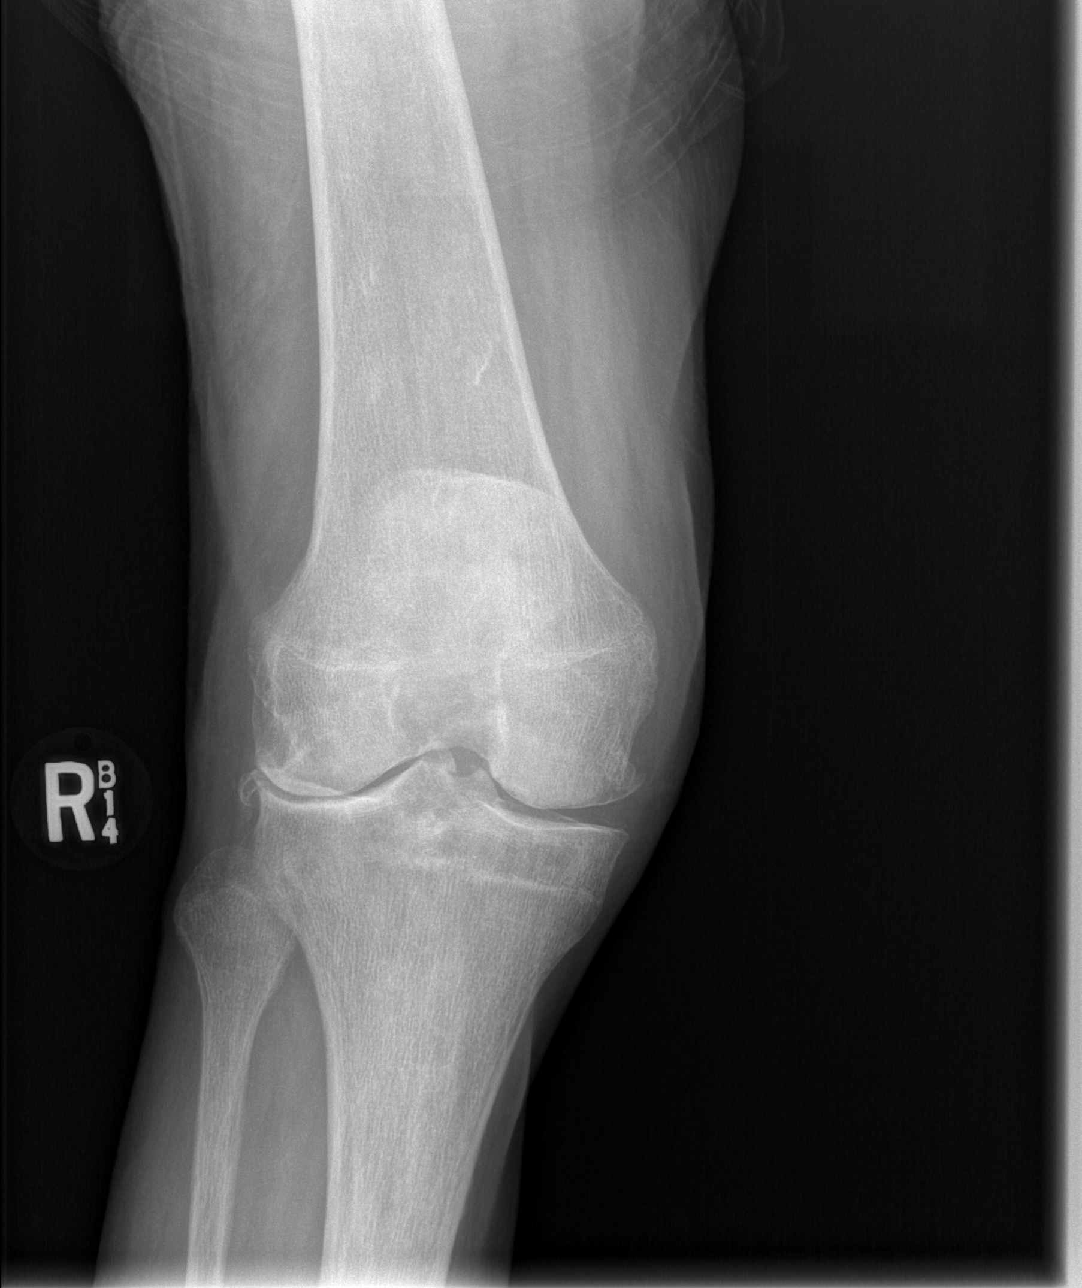

[t knee obl right (2 of 3)]
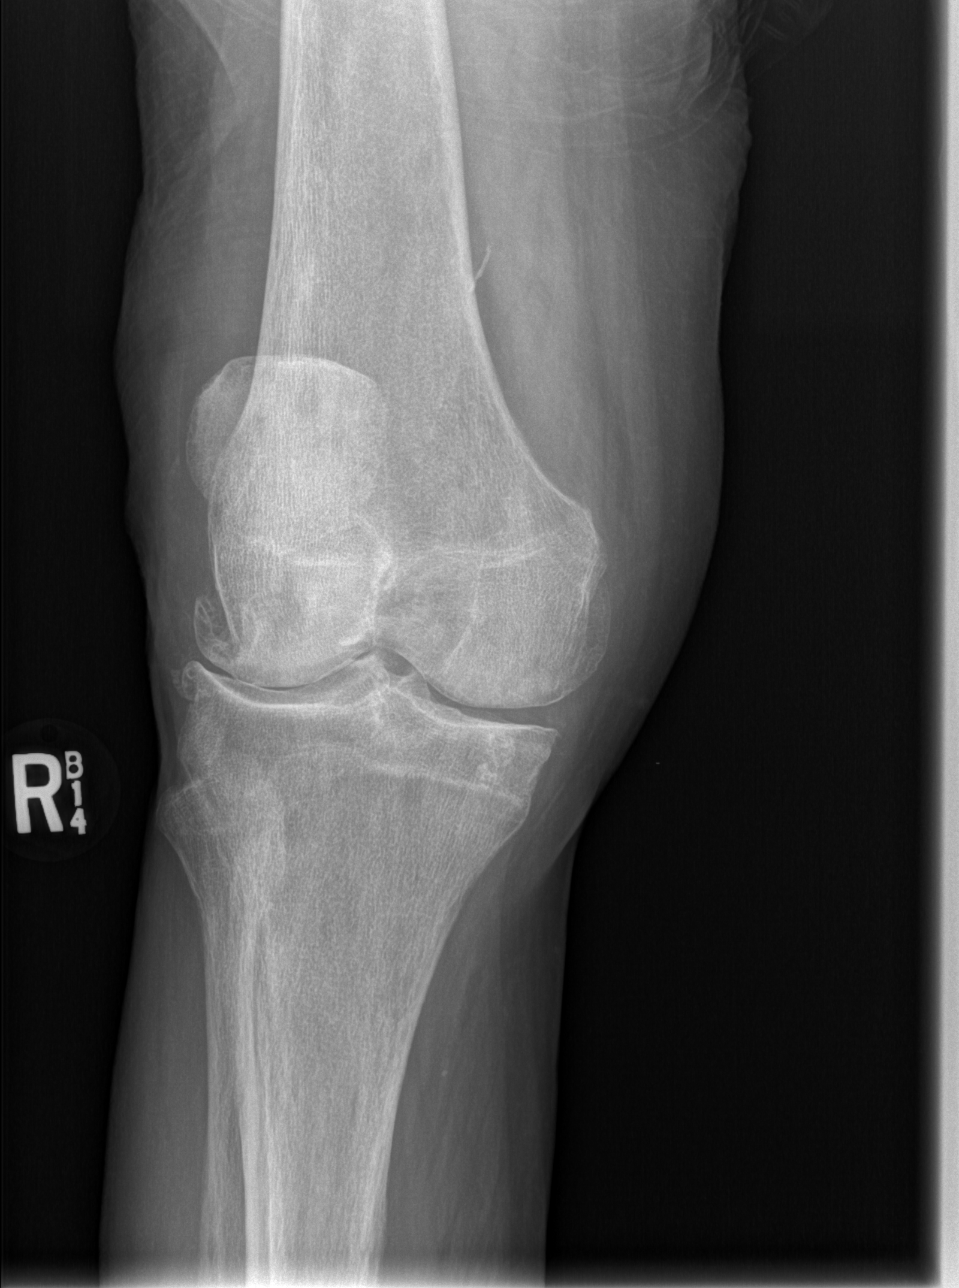

[t knee obl right (3 of 3)]
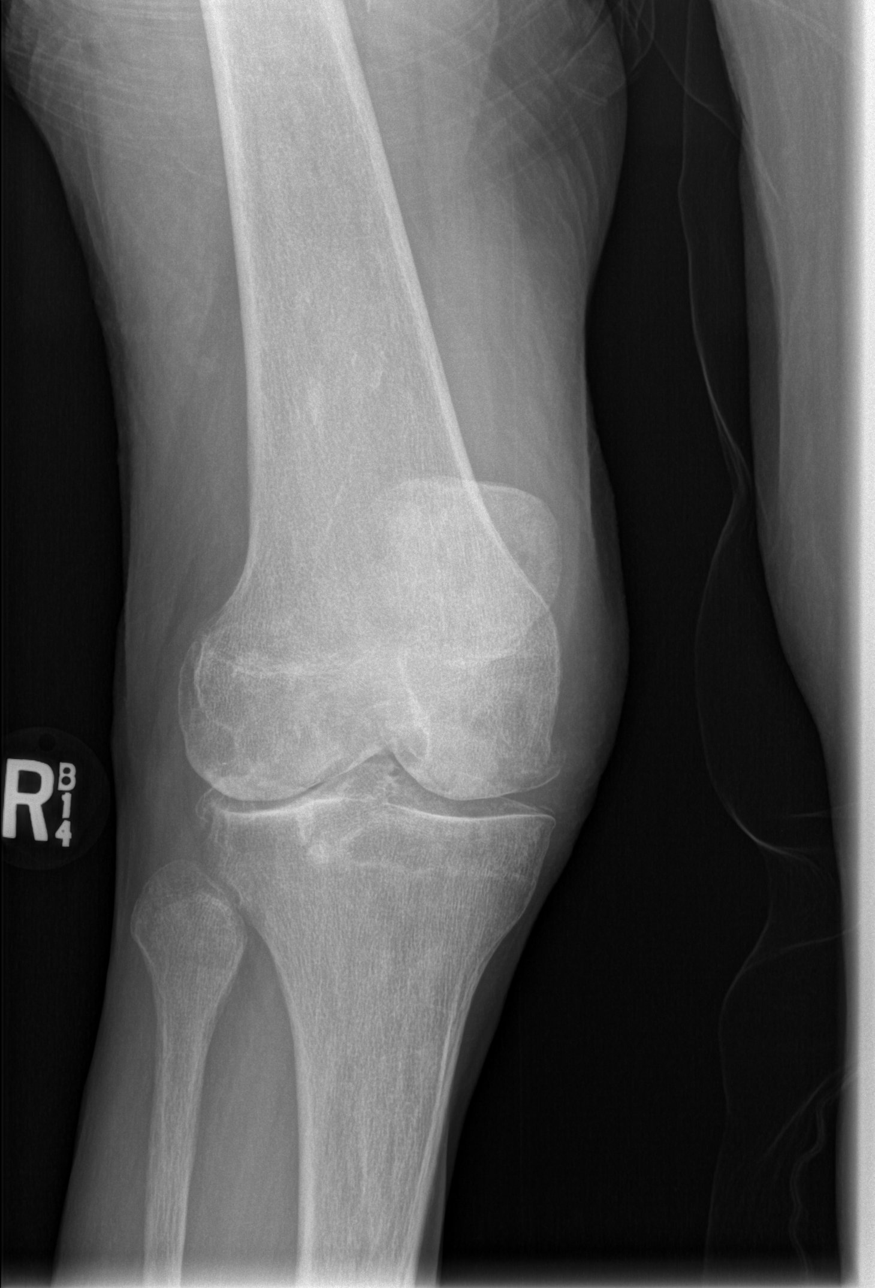

[t knee lat right]
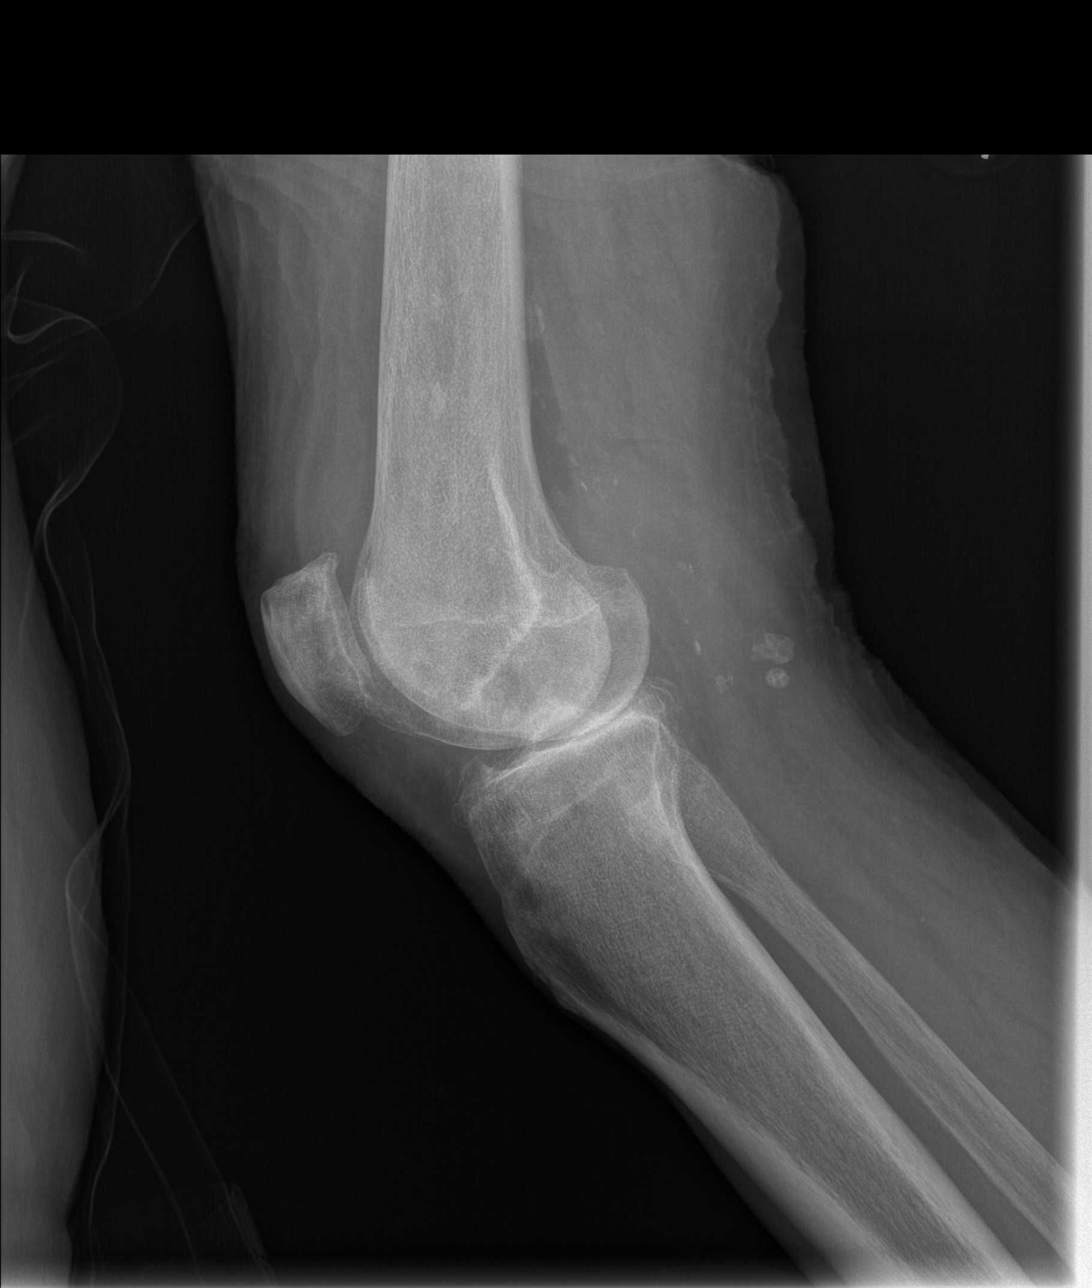

[4 of 4 positions shown; findings below may reference images not displayed]

FINDINGS: There is tricompartmental degenerative joint disease of the right
knee primarily involving the lateral compartment where there is more
loss of joint space and spurring present. Some loss of medial
compartment and patellofemoral compartment is seen with spurring.
There may be a tiny joint effusion present. No fracture is seen.
IMPRESSION: Tricompartmental degenerative joint disease of the right knee
primarily involving the lateral compartment. Question of a tiny
joint effusion.

## 2020-01-23 ENCOUNTER — Telehealth: Payer: Self-pay | Admitting: Genetic Counselor

## 2020-01-23 NOTE — Telephone Encounter (Signed)
Called patient in order to attempt to disclose updated information regarding RAD50 classification of genetic testing performed in 2019.  LVM with contact information requesting a

## 2020-01-28 NOTE — Telephone Encounter (Signed)
Revealed that patient's RAD50 result has been amended.  Re-review of the available evidence indicates that the RAD50 variant is associated with autosomal recessive RAD50-related conditions, but is not definitively known to cause autosomal dominant RAD50-related conditions.    

## 2020-02-07 ENCOUNTER — Ambulatory Visit: Payer: PPO | Attending: Internal Medicine

## 2020-02-07 DIAGNOSIS — Z23 Encounter for immunization: Secondary | ICD-10-CM

## 2020-02-07 NOTE — Progress Notes (Signed)
   Covid-19 Vaccination Clinic  Name:  [name]    MRN: [id number] DOB: Aug 27, 1932  02/07/2020  [name] was observed post Covid-19 immunization for 15 minutes without incident. She was provided with Vaccine Information Sheet and instruction to access the V-Safe system.   [name] was instructed to call 911 with any severe reactions post vaccine: Marland Kitchen Difficulty breathing  . Swelling of face and throat  . A fast heartbeat  . A bad rash all over body  . Dizziness and weakness

## 2020-02-24 NOTE — Progress Notes (Signed)
Amended report: Re-review of the available evidence indicates that the RAD50 variant is associated with autosomal recessive RAD50-related conditions, but is not definitively known to cause autosomal dominant RAD50-related conditions.

## 2020-05-25 DIAGNOSIS — E785 Hyperlipidemia, unspecified: Secondary | ICD-10-CM | POA: Diagnosis not present

## 2020-05-26 DIAGNOSIS — E785 Hyperlipidemia, unspecified: Secondary | ICD-10-CM | POA: Diagnosis not present

## 2020-05-26 DIAGNOSIS — D638 Anemia in other chronic diseases classified elsewhere: Secondary | ICD-10-CM | POA: Diagnosis not present

## 2020-05-26 DIAGNOSIS — Z0000 Encounter for general adult medical examination without abnormal findings: Secondary | ICD-10-CM | POA: Diagnosis not present

## 2020-05-26 DIAGNOSIS — N184 Chronic kidney disease, stage 4 (severe): Secondary | ICD-10-CM | POA: Diagnosis not present

## 2020-05-26 DIAGNOSIS — R109 Unspecified abdominal pain: Secondary | ICD-10-CM | POA: Diagnosis not present

## 2020-05-26 DIAGNOSIS — I129 Hypertensive chronic kidney disease with stage 1 through stage 4 chronic kidney disease, or unspecified chronic kidney disease: Secondary | ICD-10-CM | POA: Diagnosis not present

## 2020-05-26 DIAGNOSIS — K219 Gastro-esophageal reflux disease without esophagitis: Secondary | ICD-10-CM | POA: Diagnosis not present

## 2020-05-26 DIAGNOSIS — G4700 Insomnia, unspecified: Secondary | ICD-10-CM | POA: Diagnosis not present

## 2020-05-26 DIAGNOSIS — F0390 Unspecified dementia without behavioral disturbance: Secondary | ICD-10-CM | POA: Diagnosis not present

## 2020-06-08 DIAGNOSIS — D638 Anemia in other chronic diseases classified elsewhere: Secondary | ICD-10-CM | POA: Diagnosis not present

## 2020-06-08 DIAGNOSIS — R109 Unspecified abdominal pain: Secondary | ICD-10-CM | POA: Diagnosis not present

## 2020-11-24 DIAGNOSIS — K219 Gastro-esophageal reflux disease without esophagitis: Secondary | ICD-10-CM | POA: Diagnosis not present

## 2020-11-24 DIAGNOSIS — D638 Anemia in other chronic diseases classified elsewhere: Secondary | ICD-10-CM | POA: Diagnosis not present

## 2020-11-24 DIAGNOSIS — G4700 Insomnia, unspecified: Secondary | ICD-10-CM | POA: Diagnosis not present

## 2020-11-24 DIAGNOSIS — I129 Hypertensive chronic kidney disease with stage 1 through stage 4 chronic kidney disease, or unspecified chronic kidney disease: Secondary | ICD-10-CM | POA: Diagnosis not present

## 2020-11-24 DIAGNOSIS — F0390 Unspecified dementia without behavioral disturbance: Secondary | ICD-10-CM | POA: Diagnosis not present

## 2020-11-24 DIAGNOSIS — N184 Chronic kidney disease, stage 4 (severe): Secondary | ICD-10-CM | POA: Diagnosis not present

## 2021-05-20 DIAGNOSIS — Z66 Do not resuscitate: Secondary | ICD-10-CM | POA: Diagnosis not present

## 2021-05-20 DIAGNOSIS — F0390 Unspecified dementia without behavioral disturbance: Secondary | ICD-10-CM | POA: Diagnosis not present

## 2021-05-20 DIAGNOSIS — D62 Acute posthemorrhagic anemia: Secondary | ICD-10-CM | POA: Diagnosis not present

## 2021-05-20 DIAGNOSIS — I5032 Chronic diastolic (congestive) heart failure: Secondary | ICD-10-CM | POA: Diagnosis not present

## 2021-05-25 DIAGNOSIS — R7989 Other specified abnormal findings of blood chemistry: Secondary | ICD-10-CM | POA: Diagnosis not present

## 2021-05-25 DIAGNOSIS — E785 Hyperlipidemia, unspecified: Secondary | ICD-10-CM | POA: Diagnosis not present

## 2021-05-25 DIAGNOSIS — N184 Chronic kidney disease, stage 4 (severe): Secondary | ICD-10-CM | POA: Diagnosis not present

## 2021-05-31 ENCOUNTER — Inpatient Hospital Stay (HOSPITAL_COMMUNITY)
Admission: EM | Admit: 2021-05-31 | Discharge: 2021-06-06 | DRG: 812 | Disposition: A | Discharge status: Skilled Nursing Facility | Payer: PPO | Attending: Internal Medicine | Admitting: Internal Medicine

## 2021-05-31 ENCOUNTER — Other Ambulatory Visit: Payer: Self-pay

## 2021-05-31 ENCOUNTER — Encounter (HOSPITAL_COMMUNITY): Payer: Self-pay

## 2021-05-31 ENCOUNTER — Emergency Department (HOSPITAL_COMMUNITY): Payer: PPO

## 2021-05-31 DIAGNOSIS — R296 Repeated falls: Secondary | ICD-10-CM | POA: Diagnosis present

## 2021-05-31 DIAGNOSIS — Z800 Family history of malignant neoplasm of digestive organs: Secondary | ICD-10-CM | POA: Diagnosis not present

## 2021-05-31 DIAGNOSIS — Z8249 Family history of ischemic heart disease and other diseases of the circulatory system: Secondary | ICD-10-CM | POA: Diagnosis not present

## 2021-05-31 DIAGNOSIS — G250 Essential tremor: Secondary | ICD-10-CM | POA: Diagnosis present

## 2021-05-31 DIAGNOSIS — Z602 Problems related to living alone: Secondary | ICD-10-CM | POA: Diagnosis present

## 2021-05-31 DIAGNOSIS — K2980 Duodenitis without bleeding: Secondary | ICD-10-CM | POA: Diagnosis present

## 2021-05-31 DIAGNOSIS — R001 Bradycardia, unspecified: Secondary | ICD-10-CM | POA: Diagnosis present

## 2021-05-31 DIAGNOSIS — Z8371 Family history of colonic polyps: Secondary | ICD-10-CM

## 2021-05-31 DIAGNOSIS — W19XXXA Unspecified fall, initial encounter: Secondary | ICD-10-CM | POA: Diagnosis present

## 2021-05-31 DIAGNOSIS — M25561 Pain in right knee: Secondary | ICD-10-CM | POA: Diagnosis not present

## 2021-05-31 DIAGNOSIS — Z85828 Personal history of other malignant neoplasm of skin: Secondary | ICD-10-CM

## 2021-05-31 DIAGNOSIS — D649 Anemia, unspecified: Secondary | ICD-10-CM | POA: Diagnosis not present

## 2021-05-31 DIAGNOSIS — F03C0 Unspecified dementia, severe, without behavioral disturbance, psychotic disturbance, mood disturbance, and anxiety: Secondary | ICD-10-CM | POA: Diagnosis present

## 2021-05-31 DIAGNOSIS — S199XXA Unspecified injury of neck, initial encounter: Secondary | ICD-10-CM | POA: Diagnosis not present

## 2021-05-31 DIAGNOSIS — Z806 Family history of leukemia: Secondary | ICD-10-CM | POA: Diagnosis not present

## 2021-05-31 DIAGNOSIS — E7800 Pure hypercholesterolemia, unspecified: Secondary | ICD-10-CM | POA: Diagnosis present

## 2021-05-31 DIAGNOSIS — I129 Hypertensive chronic kidney disease with stage 1 through stage 4 chronic kidney disease, or unspecified chronic kidney disease: Secondary | ICD-10-CM | POA: Diagnosis present

## 2021-05-31 DIAGNOSIS — F0390 Unspecified dementia without behavioral disturbance: Secondary | ICD-10-CM | POA: Diagnosis not present

## 2021-05-31 DIAGNOSIS — Z808 Family history of malignant neoplasm of other organs or systems: Secondary | ICD-10-CM

## 2021-05-31 DIAGNOSIS — M4312 Spondylolisthesis, cervical region: Secondary | ICD-10-CM | POA: Diagnosis not present

## 2021-05-31 DIAGNOSIS — Z681 Body mass index (BMI) 19 or less, adult: Secondary | ICD-10-CM

## 2021-05-31 DIAGNOSIS — I2510 Atherosclerotic heart disease of native coronary artery without angina pectoris: Secondary | ICD-10-CM | POA: Diagnosis present

## 2021-05-31 DIAGNOSIS — N179 Acute kidney failure, unspecified: Secondary | ICD-10-CM | POA: Diagnosis present

## 2021-05-31 DIAGNOSIS — S0990XA Unspecified injury of head, initial encounter: Secondary | ICD-10-CM | POA: Diagnosis not present

## 2021-05-31 DIAGNOSIS — E041 Nontoxic single thyroid nodule: Secondary | ICD-10-CM | POA: Diagnosis not present

## 2021-05-31 DIAGNOSIS — M160 Bilateral primary osteoarthritis of hip: Secondary | ICD-10-CM | POA: Diagnosis not present

## 2021-05-31 DIAGNOSIS — Z8673 Personal history of transient ischemic attack (TIA), and cerebral infarction without residual deficits: Secondary | ICD-10-CM

## 2021-05-31 DIAGNOSIS — Z7401 Bed confinement status: Secondary | ICD-10-CM | POA: Diagnosis not present

## 2021-05-31 DIAGNOSIS — Y92009 Unspecified place in unspecified non-institutional (private) residence as the place of occurrence of the external cause: Secondary | ICD-10-CM | POA: Diagnosis not present

## 2021-05-31 DIAGNOSIS — M47812 Spondylosis without myelopathy or radiculopathy, cervical region: Secondary | ICD-10-CM | POA: Diagnosis not present

## 2021-05-31 DIAGNOSIS — E860 Dehydration: Secondary | ICD-10-CM | POA: Diagnosis not present

## 2021-05-31 DIAGNOSIS — E785 Hyperlipidemia, unspecified: Secondary | ICD-10-CM | POA: Diagnosis not present

## 2021-05-31 DIAGNOSIS — I459 Conduction disorder, unspecified: Secondary | ICD-10-CM | POA: Diagnosis present

## 2021-05-31 DIAGNOSIS — M25551 Pain in right hip: Secondary | ICD-10-CM | POA: Diagnosis not present

## 2021-05-31 DIAGNOSIS — I700 Atherosclerosis of aorta: Secondary | ICD-10-CM | POA: Diagnosis not present

## 2021-05-31 DIAGNOSIS — K2970 Gastritis, unspecified, without bleeding: Secondary | ICD-10-CM | POA: Diagnosis present

## 2021-05-31 DIAGNOSIS — M1990 Unspecified osteoarthritis, unspecified site: Secondary | ICD-10-CM | POA: Diagnosis present

## 2021-05-31 DIAGNOSIS — Z79899 Other long term (current) drug therapy: Secondary | ICD-10-CM

## 2021-05-31 DIAGNOSIS — R1111 Vomiting without nausea: Secondary | ICD-10-CM | POA: Diagnosis not present

## 2021-05-31 DIAGNOSIS — I10 Essential (primary) hypertension: Secondary | ICD-10-CM | POA: Diagnosis not present

## 2021-05-31 DIAGNOSIS — Z801 Family history of malignant neoplasm of trachea, bronchus and lung: Secondary | ICD-10-CM | POA: Diagnosis not present

## 2021-05-31 DIAGNOSIS — N184 Chronic kidney disease, stage 4 (severe): Secondary | ICD-10-CM | POA: Diagnosis present

## 2021-05-31 DIAGNOSIS — Z833 Family history of diabetes mellitus: Secondary | ICD-10-CM | POA: Diagnosis not present

## 2021-05-31 DIAGNOSIS — R55 Syncope and collapse: Secondary | ICD-10-CM | POA: Diagnosis not present

## 2021-05-31 DIAGNOSIS — K5730 Diverticulosis of large intestine without perforation or abscess without bleeding: Secondary | ICD-10-CM | POA: Diagnosis present

## 2021-05-31 DIAGNOSIS — Z8542 Personal history of malignant neoplasm of other parts of uterus: Secondary | ICD-10-CM | POA: Diagnosis not present

## 2021-05-31 DIAGNOSIS — Z20822 Contact with and (suspected) exposure to covid-19: Secondary | ICD-10-CM | POA: Diagnosis present

## 2021-05-31 DIAGNOSIS — H9190 Unspecified hearing loss, unspecified ear: Secondary | ICD-10-CM | POA: Diagnosis present

## 2021-05-31 DIAGNOSIS — I959 Hypotension, unspecified: Secondary | ICD-10-CM | POA: Diagnosis not present

## 2021-05-31 DIAGNOSIS — K219 Gastro-esophageal reflux disease without esophagitis: Secondary | ICD-10-CM | POA: Diagnosis not present

## 2021-05-31 DIAGNOSIS — R1110 Vomiting, unspecified: Secondary | ICD-10-CM | POA: Diagnosis present

## 2021-05-31 DIAGNOSIS — M1711 Unilateral primary osteoarthritis, right knee: Secondary | ICD-10-CM | POA: Diagnosis not present

## 2021-05-31 DIAGNOSIS — W19XXXD Unspecified fall, subsequent encounter: Secondary | ICD-10-CM | POA: Diagnosis not present

## 2021-05-31 DIAGNOSIS — Z888 Allergy status to other drugs, medicaments and biological substances status: Secondary | ICD-10-CM

## 2021-05-31 DIAGNOSIS — R54 Age-related physical debility: Secondary | ICD-10-CM | POA: Diagnosis present

## 2021-05-31 LAB — CBC WITH DIFFERENTIAL/PLATELET
Abs Immature Granulocytes: 0.03 10*3/uL (ref 0.00–0.07)
Basophils Absolute: 0.1 10*3/uL (ref 0.0–0.1)
Basophils Relative: 1 %
Eosinophils Absolute: 0 10*3/uL (ref 0.0–0.5)
Eosinophils Relative: 0 %
HCT: 19.3 % — ABNORMAL LOW (ref 36.0–46.0)
Hemoglobin: 6.1 g/dL — CL (ref 12.0–15.0)
Immature Granulocytes: 0 %
Lymphocytes Relative: 11 %
Lymphs Abs: 0.8 10*3/uL (ref 0.7–4.0)
MCH: 29.6 pg (ref 26.0–34.0)
MCHC: 31.6 g/dL (ref 30.0–36.0)
MCV: 93.7 fL (ref 80.0–100.0)
Monocytes Absolute: 0.8 10*3/uL (ref 0.1–1.0)
Monocytes Relative: 11 %
Neutro Abs: 5.3 10*3/uL (ref 1.7–7.7)
Neutrophils Relative %: 77 %
Platelets: 184 10*3/uL (ref 150–400)
RBC: 2.06 MIL/uL — ABNORMAL LOW (ref 3.87–5.11)
RDW: 19.9 % — ABNORMAL HIGH (ref 11.5–15.5)
WBC: 7 10*3/uL (ref 4.0–10.5)
nRBC: 0 % (ref 0.0–0.2)

## 2021-05-31 LAB — URINALYSIS, COMPLETE (UACMP) WITH MICROSCOPIC
Bacteria, UA: NONE SEEN
Bilirubin Urine: NEGATIVE
Glucose, UA: NEGATIVE mg/dL
Hgb urine dipstick: NEGATIVE
Ketones, ur: NEGATIVE mg/dL
Nitrite: NEGATIVE
Protein, ur: NEGATIVE mg/dL
Specific Gravity, Urine: 1.016 (ref 1.005–1.030)
pH: 5 (ref 5.0–8.0)

## 2021-05-31 LAB — IRON AND TIBC
Iron: 26 ug/dL — ABNORMAL LOW (ref 28–170)
Saturation Ratios: 11 % (ref 10.4–31.8)
TIBC: 228 ug/dL — ABNORMAL LOW (ref 250–450)
UIBC: 202 ug/dL

## 2021-05-31 LAB — BASIC METABOLIC PANEL
Anion gap: 6 (ref 5–15)
BUN: 38 mg/dL — ABNORMAL HIGH (ref 8–23)
CO2: 21 mmol/L — ABNORMAL LOW (ref 22–32)
Calcium: 8.7 mg/dL — ABNORMAL LOW (ref 8.9–10.3)
Chloride: 112 mmol/L — ABNORMAL HIGH (ref 98–111)
Creatinine, Ser: 1.88 mg/dL — ABNORMAL HIGH (ref 0.44–1.00)
GFR, Estimated: 26 mL/min — ABNORMAL LOW (ref >60)
Glucose, Bld: 104 mg/dL — ABNORMAL HIGH (ref 70–99)
Potassium: 4.7 mmol/L (ref 3.5–5.1)
Sodium: 139 mmol/L (ref 135–145)

## 2021-05-31 LAB — RESP PANEL BY RT-PCR (FLU A&B, COVID) ARPGX2
Influenza A by PCR: NEGATIVE
Influenza B by PCR: NEGATIVE
SARS Coronavirus 2 by RT PCR: NEGATIVE

## 2021-05-31 LAB — POC OCCULT BLOOD, ED: Fecal Occult Bld: NEGATIVE

## 2021-05-31 LAB — FOLATE: Folate: 13.8 ng/mL (ref >5.9)

## 2021-05-31 LAB — PREPARE RBC (CROSSMATCH)

## 2021-05-31 LAB — FERRITIN: Ferritin: 295 ng/mL (ref 11–307)

## 2021-05-31 LAB — VITAMIN B12: Vitamin B-12: 357 pg/mL (ref 180–914)

## 2021-05-31 LAB — LACTATE DEHYDROGENASE: LDH: 187 U/L (ref 98–192)

## 2021-05-31 LAB — CK: Total CK: 297 U/L — ABNORMAL HIGH (ref 38–234)

## 2021-05-31 LAB — HEMOGLOBIN AND HEMATOCRIT, BLOOD
HCT: 22.1 % — ABNORMAL LOW (ref 36.0–46.0)
Hemoglobin: 7.1 g/dL — ABNORMAL LOW (ref 12.0–15.0)

## 2021-05-31 MED ORDER — DONEPEZIL HCL 10 MG PO TABS
10.0000 mg | ORAL_TABLET | Freq: Every day | ORAL | Status: DC
  Administered 2021-06-01 – 2021-06-05 (×6): 10 mg via ORAL
  Filled 2021-05-31 (×6): qty 1

## 2021-05-31 MED ORDER — PANTOPRAZOLE SODIUM 40 MG PO TBEC: 40.0000 mg | DELAYED_RELEASE_TABLET | Freq: Every day | ORAL | Status: DC

## 2021-05-31 MED ORDER — ADULT MULTIVITAMIN W/MINERALS CH
1.0000 | ORAL_TABLET | Freq: Every day | ORAL | Status: DC
  Administered 2021-06-01 – 2021-06-06 (×6): 1 via ORAL
  Filled 2021-05-31 (×6): qty 1

## 2021-05-31 MED ORDER — SODIUM CHLORIDE 0.9 % IV BOLUS
1000.0000 mL | Freq: Once | INTRAVENOUS | Status: AC
  Administered 2021-05-31: 1000 mL via INTRAVENOUS

## 2021-05-31 MED ORDER — SODIUM CHLORIDE 0.9 % IV SOLN
10.0000 mL/h | Freq: Once | INTRAVENOUS | Status: AC
  Administered 2021-05-31: 10 mL/h via INTRAVENOUS

## 2021-05-31 MED ORDER — FOLIC ACID 1 MG PO TABS
1.0000 mg | ORAL_TABLET | Freq: Every day | ORAL | Status: DC
  Administered 2021-06-01 – 2021-06-06 (×6): 1 mg via ORAL
  Filled 2021-05-31 (×6): qty 1

## 2021-05-31 MED ORDER — ACETAMINOPHEN 325 MG PO TABS
650.0000 mg | ORAL_TABLET | Freq: Four times a day (QID) | ORAL | Status: DC | PRN
  Administered 2021-06-01 – 2021-06-04 (×4): 650 mg via ORAL
  Filled 2021-05-31 (×4): qty 2

## 2021-05-31 MED ORDER — ACETAMINOPHEN 650 MG RE SUPP: 650.0000 mg | Freq: Four times a day (QID) | RECTAL | Status: DC | PRN

## 2021-05-31 NOTE — ED Notes (Signed)
Pt given cup of water and cut lights down for comfort

## 2021-05-31 NOTE — ED Notes (Signed)
Pt ambulated to bathroom with assistance.

## 2021-05-31 NOTE — ED Notes (Signed)
ED TO INPATIENT HANDOFF REPORT  ED Nurse Name and Phone #: Fabio Neighbors RN  S Name/Age/Gender [name] 88 y.o. female Room/Bed: WA20/WA20  Code Status   Code Status: Prior  Home/SNF/Other Home Patient oriented to: self Is this baseline? Yes   Triage Complete: Triage complete  Chief Complaint Symptomatic anemia [D64.9]  Triage Note Pt coming from home via EMS where brother found pt on the floor with vomit next to her. Unsure how long she was on the floor. Pt seems to have some rt. knee and hip pain on assessment. Hx dementia. Oriented to self and knows who her brother is, this reportedly is her baseline. Pt is HOH.   Allergies Allergies  Allergen Reactions   Benadryl [Diphenhydramine Hcl (Sleep)] Other (See Comments)    Light headed, heart racing.    Ciprofloxacin Other (See Comments)    Leg pains   Pravastatin Other (See Comments)    Leg cramps   Vioxx [Rofecoxib] Nausea Only   Cephalosporins Rash    Level of Care/Admitting Diagnosis ED Disposition     ED Disposition  Admit   Condition  --   Comment  Hospital Area: [facility] [[id number]]  Level of Care: Telemetry [5]  Admit to tele based on following criteria: Monitor for Ischemic changes  May place patient in observation at [facility] or [place] if equivalent level of care is available:: No  Covid Evaluation: Asymptomatic Screening Protocol (No Symptoms)  Diagnosis: Symptomatic anemia [6010932]  Admitting Physician: Jonnie Finner [[id number]]  Attending Physician: Jonnie Finner [[id number]]          B Medical/Surgery History Past Medical History:  Diagnosis Date   Arthritis    Baker cyst    a. 04/2014 on right.   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    History of gallstones    History of kidney stones    Hypercholesterolemia    Obesity    TIA (transient ischemic attack)    Varicose veins    Past Surgical History:  Procedure Laterality Date   CATARACT EXTRACTION Bilateral 2005   CATARACT EXTRACTION W/ INTRAOCULAR LENS  IMPLANT, BILATERAL     CHOLECYSTECTOMY     CHOLECYSTECTOMY, LAPAROSCOPIC  2002   COLONOSCOPY     EYE SURGERY     HYSTEROSCOPY WITH D & C N/A 12/18/2013   Procedure: DILATATION AND CURETTAGE /HYSTEROSCOPY;  Surgeon: Gus Height, MD;  Location: [facility];  Service: Gynecology;  Laterality: N/A;   LUMBAR DISC SURGERY Right 1998   L1-L2   LYMPH NODE DISSECTION N/A 02/17/2014   Procedure: LYMPH NODE DISSECTION;  Surgeon: Imagene Gurney A. Alycia Rossetti, MD;  Location: [facility];  Service: Gynecology;  Laterality: N/A;   ROBOTIC ASSISTED TOTAL HYSTERECTOMY WITH BILATERAL SALPINGO OOPHERECTOMY N/A 02/17/2014   with Lymph node dissection ; Surgeon: Imagene Gurney A. Alycia Rossetti, MD;  Location: [facility];  Service: Gynecology;  Laterality: N/A;   ROTATOR CUFF REPAIR Bilateral 2003   SKIN BIOPSY     Four times last 2005 - nose cancer   TENDON GRAFT  2003   Bone graft   TONSILLECTOMY AND ADENOIDECTOMY  1958   WISDOM TOOTH EXTRACTION       A IV Location/Drains/Wounds Patient Lines/Drains/Airways Status     Active Line/Drains/Airways     Name Placement date Placement time Site Days   Peripheral IV 05/31/21 20 G 1" Posterior;Right Forearm 05/31/21  1236  Forearm  less than 1   Peripheral IV 05/31/21 20 G Left Antecubital 05/31/21  1442  Antecubital  less than 1            Intake/Output Last 24 hours  Intake/Output Summary (Last 24 hours) at 05/31/2021 2220 Last data filed at 05/31/2021 1740 Gross per 24 hour  Intake 630 ml  Output --  Net 630 ml    Labs/Imaging Results for orders placed or performed during  the hospital encounter of 05/31/21 (from the past 48 hour(s))  CBC with Differential     Status: Abnormal   Collection Time: 05/31/21 11:10 AM  Result Value Ref Range   WBC 7.0 4.0 - 10.5 K/uL   RBC 2.06 (L) 3.87 - 5.11 MIL/uL   Hemoglobin 6.1 (LL) 12.0 - 15.0 g/dL    Comment: This critical result has verified and been called to BRATU, B by [employer] on 02 21 2023 at 1206, and has been read back. CRITICAL RESULTS VERIFIED.   HCT 19.3 (L) 36.0 - 46.0 %   MCV 93.7 80.0 - 100.0 fL   MCH 29.6 26.0 - 34.0 pg   MCHC 31.6 30.0 - 36.0 g/dL   RDW 19.9 (H) 11.5 - 15.5 %   Platelets 184 150 - 400 K/uL   nRBC 0.0 0.0 - 0.2 %   Neutrophils Relative % 77 %   Neutro Abs 5.3 1.7 - 7.7 K/uL   Lymphocytes Relative 11 %   Lymphs Abs 0.8 0.7 - 4.0 K/uL   Monocytes Relative 11 %   Monocytes Absolute 0.8 0.1 - 1.0 K/uL   Eosinophils Relative 0 %   Eosinophils Absolute 0.0 0.0 - 0.5 K/uL   Basophils Relative 1 %   Basophils Absolute 0.1 0.0 - 0.1 K/uL   Immature Granulocytes 0 %   Abs Immature Granulocytes 0.03 0.00 - 0.07 K/uL    Comment: Performed at [facility], [place] [address] [place], [place] [address]  Basic metabolic panel     Status: Abnormal   Collection Time: 05/31/21 11:10 AM  Result Value Ref Range   Sodium 139 135 - 145 mmol/L   Potassium 4.7 3.5 - 5.1 mmol/L   Chloride 112 (H) 98 - 111 mmol/L   CO2 21 (L) 22 - 32 mmol/L   Glucose, Bld 104 (H) 70 - 99 mg/dL    Comment: Glucose reference range applies only to samples taken after fasting for at least 8 hours.   BUN 38 (H) 8 - 23 mg/dL   Creatinine, Ser 1.88 (H) 0.44 - 1.00 mg/dL   Calcium 8.7 (L) 8.9 - 10.3 mg/dL   GFR, Estimated 26 (L) >60 mL/min    Comment: (NOTE) Calculated using the CKD-EPI Creatinine Equation (2021)    Anion gap 6 5 - 15    Comment: Performed at [facility], [place] [address], [place], [place] [address]  Type and screen [facility]     Status: None  (Preliminary result)   Collection Time: 05/31/21 12:38 PM  Result Value Ref Range   ABO/RH(D) B POS    Antibody Screen NEG    Sample  Expiration 06/03/2021,2359    Unit Number [id number]    Blood Component Type RED CELLS,LR    Unit division 00    Status of Unit ISSUED    Transfusion Status OK TO TRANSFUSE    Crossmatch Result      Compatible Performed at [facility] [address] [place], [place] [address]   Prepare RBC (crossmatch)     Status: None   Collection Time: 05/31/21 12:38 PM  Result Value Ref Range   Order Confirmation      ORDER PROCESSED BY BLOOD BANK Performed at [facility], [place] [address] [place], [place] [address]   POC occult blood, ED     Status: None   Collection Time: 05/31/21  1:42 PM  Result Value Ref Range   Fecal Occult Bld NEGATIVE NEGATIVE  Resp Panel by RT-PCR (Flu A&B, Covid) Nasopharyngeal Swab     Status: None   Collection Time: 05/31/21  2:23 PM   Specimen: Nasopharyngeal Swab; Nasopharyngeal(NP) swabs in vial transport medium  Result Value Ref Range   SARS Coronavirus 2 by RT PCR NEGATIVE NEGATIVE    Comment: (NOTE) SARS-CoV-2 target nucleic acids are NOT DETECTED.  The SARS-CoV-2 RNA is generally detectable in upper respiratory specimens during the acute phase of infection. The lowest concentration of SARS-CoV-2 viral copies this assay can detect is 138 copies/mL. A negative result does not preclude SARS-Cov-2 infection and should not be used as the sole basis for treatment or other patient management decisions. A negative result may occur with  improper specimen collection/handling, submission of specimen other than nasopharyngeal swab, presence of viral mutation(s) within the areas targeted by this assay, and inadequate number of viral copies(<138 copies/mL). A negative result must be combined with clinical observations, patient history, and epidemiological information. The expected result  is Negative.  Fact Sheet for Patients:  EntrepreneurPulse.com.au  Fact Sheet for Healthcare Providers:  IncredibleEmployment.be  This test is no t yet approved or cleared by the [place] FDA and  has been authorized for detection and/or diagnosis of SARS-CoV-2 by FDA under an Emergency Use Authorization (EUA). This EUA will remain  in effect (meaning this test can be used) for the duration of the COVID-19 declaration under Section 564(b)(1) of the Act, 21 U.S.C.section 360bbb-3(b)(1), unless the authorization is terminated  or revoked sooner.       Influenza A by PCR NEGATIVE NEGATIVE   Influenza B by PCR NEGATIVE NEGATIVE    Comment: (NOTE) The Xpert Xpress SARS-CoV-2/FLU/RSV plus assay is intended as an aid in the diagnosis of influenza from Nasopharyngeal swab specimens and should not be used as a sole basis for treatment. Nasal washings and aspirates are unacceptable for Xpert Xpress SARS-CoV-2/FLU/RSV testing.  Fact Sheet for Patients: EntrepreneurPulse.com.au  Fact Sheet for Healthcare Providers: IncredibleEmployment.be  This test is not yet approved or cleared by the [place] FDA and has been authorized for detection and/or diagnosis of SARS-CoV-2 by FDA under an Emergency Use Authorization (EUA). This EUA will remain in effect (meaning this test can be used) for the duration of the COVID-19 declaration under Section 564(b)(1) of the Act, 21 U.S.C. section 360bbb-3(b)(1), unless the authorization is terminated or revoked.  Performed at [facility], [place] [address], [place],  [address]   CK     Status: Abnormal   Collection Time: 05/31/21  2:23 PM  Result Value Ref Range   Total CK 297 (H) 38 - 234 U/L    Comment:  Performed at [facility], [place] [address], [place], [place] [address]  Iron and TIBC     Status: Abnormal   Collection Time:  05/31/21  2:23 PM  Result Value Ref Range   Iron 26 (L) 28 - 170 ug/dL   TIBC 228 (L) 250 - 450 ug/dL   Saturation Ratios 11 10.4 - 31.8 %   UIBC 202 ug/dL    Comment: Performed at [facility], [place] [address], [place], [place] [address]  Ferritin (Iron Binding Protein)     Status: None   Collection Time: 05/31/21  2:23 PM  Result Value Ref Range   Ferritin 295 11 - 307 ng/mL    Comment: Performed at [facility], [place] [address], [place], [place] [address]  Lactate dehydrogenase     Status: None   Collection Time: 05/31/21  2:23 PM  Result Value Ref Range   LDH 187 98 - 192 U/L    Comment: Performed at [facility], [place] [address], [place], [place] [address]  Vitamin B12     Status: None   Collection Time: 05/31/21  2:23 PM  Result Value Ref Range   Vitamin B-12 357 180 - 914 pg/mL    Comment: (NOTE) This assay is not validated for testing neonatal or myeloproliferative syndrome specimens for Vitamin B12 levels. Performed at [facility], [place] [address] [place], [place] [address]   Folate     Status: None   Collection Time: 05/31/21  2:23 PM  Result Value Ref Range   Folate 13.8 >5.9 ng/mL    Comment: Performed at [facility], [place] [address] [place], [place] [address]  Urinalysis, Complete w Microscopic Urine, Clean Catch     Status: Abnormal   Collection Time: 05/31/21  4:06 PM  Result Value Ref Range   Color, Urine YELLOW YELLOW   APPearance CLEAR CLEAR   Specific Gravity, Urine 1.016 1.005 - 1.030   pH 5.0 5.0 - 8.0   Glucose, UA NEGATIVE NEGATIVE mg/dL   Hgb urine dipstick NEGATIVE NEGATIVE   Bilirubin Urine NEGATIVE NEGATIVE   Ketones, ur NEGATIVE NEGATIVE mg/dL   Protein, ur NEGATIVE NEGATIVE mg/dL   Nitrite NEGATIVE NEGATIVE   Leukocytes,Ua LARGE (A) NEGATIVE   RBC / HPF 0-5 0 - 5 RBC/hpf   WBC, UA 6-10 0 - 5 WBC/hpf   Bacteria, UA NONE SEEN NONE SEEN   Squamous  Epithelial / LPF 0-5 0 - 5    Comment: Performed at [facility], [place] [address] [place],  [address]   CT Head Wo Contrast  Result Date: 05/31/2021 CLINICAL DATA:  Head trauma EXAM: CT HEAD WITHOUT CONTRAST CT MAXILLOFACIAL WITHOUT CONTRAST TECHNIQUE: Multidetector CT imaging of the head and maxillofacial structures were performed using the standard protocol without intravenous contrast. Multiplanar CT image reconstructions of the maxillofacial structures were also generated. RADIATION DOSE REDUCTION: This exam was performed according to the departmental dose-optimization program which includes automated exposure control, adjustment of the mA and/or kV according to patient size and/or use of iterative reconstruction technique. COMPARISON:  Brain MRI 03/22/2018, CT head and maxillofacial CT 10/10/2018 FINDINGS: CT HEAD FINDINGS Brain: There is no evidence of acute intracranial hemorrhage, extra-axial fluid collection, or acute infarct. Background parenchymal volume is normal for age. The ventricles are stable in size. There is a mild burden of white matter microangiopathic change. There is no mass lesion. There is no midline shift. Vascular: There is  calcification of the bilateral cavernous ICAs. Skull: Normal. Negative for fracture or focal lesion. Other: None. CT MAXILLOFACIAL FINDINGS Osseous: There is no acute facial bone fracture. There is no evidence of mandibular dislocation. There is no suspicious osseous lesion. There is advanced degenerative change of the left temporomandibular joint. Orbits: Bilateral lens implants are in place. The globes and orbits are otherwise unremarkable. Sinuses: There is soap bubble appearing fluid in the left sphenoid sinus. The other paranasal sinuses are clear. Soft tissues: Unremarkable. IMPRESSION: 1. No acute intracranial hemorrhage or calvarial fracture. 2. No acute facial bone fracture. Electronically Signed   By: Valetta Mole M.D.   On:  05/31/2021 12:24   CT Cervical Spine Wo Contrast  Result Date: 05/31/2021 CLINICAL DATA:  Trauma EXAM: CT CERVICAL SPINE WITHOUT CONTRAST TECHNIQUE: Multidetector CT imaging of the cervical spine was performed without intravenous contrast. Multiplanar CT image reconstructions were also generated. RADIATION DOSE REDUCTION: This exam was performed according to the departmental dose-optimization program which includes automated exposure control, adjustment of the mA and/or kV according to patient size and/or use of iterative reconstruction technique. COMPARISON:  Cervical spine CT 10/10/2018 FINDINGS: Alignment: There is grade 1 retrolisthesis C3 on C4 and C4 on C5, and grade 1 anterolisthesis C5 on C6 and C6 on C7, unchanged and likely degenerative in nature. There is no jumped or perched facets or other evidence of traumatic malalignment. Skull base and vertebrae: Skull base alignment is maintained. Vertebral body heights are preserved. There is no evidence of acute fracture. Soft tissues and spinal canal: No prevertebral fluid or swelling. No visible canal hematoma. Disc levels: There is bulky degenerative pannus about the dens without evidence of mass effect on the underlying cord. There is advanced multilevel intervertebral disc space narrowing and degenerative endplate change, most advanced at C3-C4 and C4-C5. There is advanced multilevel facet arthropathy, most advanced on the left at C2-C3 and C5-C6. There is probable moderate spinal canal stenosis at C4-C5 and up to severe neural foraminal stenosis on the right at C3-C4 and on the left at C4-C5. Findings are overall not significantly changed. Upper chest: The imaged lung apices are clear. Other: There is a hypodense right thyroid nodule measuring up to 1.2 cm, not significantly changed since 10/10/2018. IMPRESSION: 1. No acute fracture or traumatic malalignment of the cervical spine. 2. Advanced multilevel degenerative changes and degenerative  spondylolisthesis as detailed above. Electronically Signed   By: Valetta Mole M.D.   On: 05/31/2021 12:30   DG Knee Complete 4 Views Right  Result Date: 05/31/2021 CLINICAL DATA:  Fall. Found on floor. Right knee pain. EXAM: RIGHT KNEE - COMPLETE 4+ VIEW COMPARISON:  Right knee radiographs 07/25/2017. FINDINGS: The right knee is located. Progressive Vance tricompartmental degenerative changes are present. No significant effusion is present. Vascular calcifications are noted. IMPRESSION: 1. Progressive tricompartmental degenerative changes of the right knee. 2. No acute abnormality. 3. Atherosclerosis. Electronically Signed   By: San Morelle M.D.   On: 05/31/2021 11:53   DG Hip Unilat W or Wo Pelvis 2-3 Views Right  Result Date: 05/31/2021 CLINICAL DATA:  Fall. Found on floor. Right hip pain. EXAM: DG HIP (WITH OR WITHOUT PELVIS) 2-3V RIGHT COMPARISON:  None. FINDINGS: The right hip is located. Moderate asymmetric degenerative changes are present. No acute fracture is present. Visualized pelvis demonstrates no acute fracture. Degenerative changes are present in the lower lumbar spine. IMPRESSION: 1. No acute abnormality. 2. Moderate asymmetric degenerative changes of the right hip. Electronically Signed   By:  San Morelle M.D.   On: 05/31/2021 11:52   CT Maxillofacial Wo Contrast  Result Date: 05/31/2021 CLINICAL DATA:  Head trauma EXAM: CT HEAD WITHOUT CONTRAST CT MAXILLOFACIAL WITHOUT CONTRAST TECHNIQUE: Multidetector CT imaging of the head and maxillofacial structures were performed using the standard protocol without intravenous contrast. Multiplanar CT image reconstructions of the maxillofacial structures were also generated. RADIATION DOSE REDUCTION: This exam was performed according to the departmental dose-optimization program which includes automated exposure control, adjustment of the mA and/or kV according to patient size and/or use of iterative reconstruction technique.  COMPARISON:  Brain MRI 03/22/2018, CT head and maxillofacial CT 10/10/2018 FINDINGS: CT HEAD FINDINGS Brain: There is no evidence of acute intracranial hemorrhage, extra-axial fluid collection, or acute infarct. Background parenchymal volume is normal for age. The ventricles are stable in size. There is a mild burden of white matter microangiopathic change. There is no mass lesion. There is no midline shift. Vascular: There is calcification of the bilateral cavernous ICAs. Skull: Normal. Negative for fracture or focal lesion. Other: None. CT MAXILLOFACIAL FINDINGS Osseous: There is no acute facial bone fracture. There is no evidence of mandibular dislocation. There is no suspicious osseous lesion. There is advanced degenerative change of the left temporomandibular joint. Orbits: Bilateral lens implants are in place. The globes and orbits are otherwise unremarkable. Sinuses: There is soap bubble appearing fluid in the left sphenoid sinus. The other paranasal sinuses are clear. Soft tissues: Unremarkable. IMPRESSION: 1. No acute intracranial hemorrhage or calvarial fracture. 2. No acute facial bone fracture. Electronically Signed   By: Valetta Mole M.D.   On: 05/31/2021 12:24    Pending Labs Unresulted Labs (From admission, onward)     Start     Ordered   05/31/21 1417  Haptoglobin  Once,   STAT        05/31/21 1417   Signed and Held  Comprehensive metabolic panel  Tomorrow morning,   R        Signed and Held   Signed and Held  CBC  Tomorrow morning,   R        Signed and Held   Signed and Held  Hemoglobin and hematocrit, blood  Every 6 hours,   R      Signed and Held            Vitals/Pain Today's Vitals   05/31/21 1900 05/31/21 1930 05/31/21 2000 05/31/21 2015  BP: 106/63 (!) 110/52 102/66 122/60  Pulse: (!) 58 (!) 58 (!) 54 63  Resp: '15 14 13 15  ' Temp:      TempSrc:      SpO2: 100% 100% 100% 99%    Isolation Precautions No active isolations  Medications Medications  0.9 %   sodium chloride infusion (0 mL/hr Intravenous Stopped 05/31/21 1820)  sodium chloride 0.9 % bolus 1,000 mL (0 mLs Intravenous Stopped 05/31/21 1540)    Mobility walks with person assist High fall risk   Focused Assessments    R Recommendations: See Admitting Provider Note  Report given to:  Hollice Espy RN  Additional Notes:

## 2021-05-31 NOTE — Plan of Care (Signed)

## 2021-05-31 NOTE — ED Notes (Signed)
Pt's niece in room at bedside

## 2021-05-31 NOTE — ED Provider Notes (Signed)
[name] DEPT Provider Note   CSN: [id number] Arrival date & time: 05/31/21  1022     History  Chief Complaint  Patient presents with   Lytle Michaels    [name] is a 88 y.o. female Pleasantly demented 88 year old female who was found by her brother at her living facility after sustaining presumed fall.  Patient complains of pain in the right hip, right knee.  But it is difficult for her to explain more than that due to her dementia.  Mentally she is at baseline according to her brother.  She denies any chest pain, shortness of breath.  Unclear whether she struck her head, she does not take any blood thinners.   Fall      Home Medications Prior to Admission medications   Medication Sig Start Date End Date Taking? Authorizing Provider  donepezil (ARICEPT) 10 MG tablet Take 10 mg by mouth at bedtime. 01/29/21   [provider]  esomeprazole (NEXIUM) 40 MG capsule Take 1 capsule (40 mg total) by mouth daily. 06/13/11 06/23/11  Sable Feil, MD  Linaclotide Rolan Lipa) 145 MCG CAPS Take 1 capsule by mouth daily. 06/13/11 06/23/11  Sable Feil, MD      Allergies    Benadryl [diphenhydramine hcl (sleep)], Ciprofloxacin, Pravastatin, Vioxx [rofecoxib], and Cephalosporins    Review of Systems   Review of Systems  Musculoskeletal:  Positive for arthralgias.  Skin:  Positive for wound.  Neurological:  Positive for weakness.  All other systems reviewed and are negative.  Physical Exam Updated Vital Signs BP (!) 123/58    Pulse 60    Temp 98.6 F (37 C) (Oral)    Resp 18    SpO2 100%  Physical Exam Vitals and nursing note reviewed.  Constitutional:      General: She is not in acute distress.    Appearance: Normal appearance.     Comments: Patient is pleasantly demented, complaining of some right hip pain, right knee pain on arrival.  HENT:     Head: Normocephalic and atraumatic.  Eyes:     General:        Right eye: No  discharge.        Left eye: No discharge.  Cardiovascular:     Rate and Rhythm: Normal rate and regular rhythm.     Pulses: Normal pulses.     Heart sounds: No murmur heard.   No friction rub. No gallop.     Comments: DP, PT pulses intact bilaterally, radial, ulnar pulses intact bilaterally. Pulmonary:     Effort: Pulmonary effort is normal.     Breath sounds: Normal breath sounds.  Abdominal:     General: Bowel sounds are normal.     Palpations: Abdomen is soft.     Comments: No tenderness to palpation  Musculoskeletal:     Comments: No leg length discrepancy, no palpable step-off or deformity.  She does have some reaction, tenderness palpation of the right knee, and right hip.  Skin:    General: Skin is warm and dry.     Capillary Refill: Capillary refill takes less than 2 seconds.  Neurological:     Mental Status: She is alert and oriented to person, place, and time.     Comments: Patient is alert and oriented to her baseline which is alert and oriented only to herself.  She also knows who her brother is.  He reports that she is at her neurologic baseline.  She is able to  follow simple commands, and respond to questions but otherwise difficult to perform a full neurologic assessment.  She has symmetric facial muscles, and moves all 4 limbs spontaneously.  Patient with some endorsement of lightheadedness on sitting  Psychiatric:        Mood and Affect: Mood normal.        Behavior: Behavior normal.    ED Results / Procedures / Treatments   Labs (all labs ordered are listed, but only abnormal results are displayed) Labs Reviewed  CBC WITH DIFFERENTIAL/PLATELET - Abnormal; Notable for the following components:      Result Value   RBC 2.06 (*)    Hemoglobin 6.1 (*)    HCT 19.3 (*)    RDW 19.9 (*)    All other components within normal limits  BASIC METABOLIC PANEL - Abnormal; Notable for the following components:   Chloride 112 (*)    CO2 21 (*)    Glucose, Bld 104 (*)     BUN 38 (*)    Creatinine, Ser 1.88 (*)    Calcium 8.7 (*)    GFR, Estimated 26 (*)    All other components within normal limits  CK - Abnormal; Notable for the following components:   Total CK 297 (*)    All other components within normal limits  IRON AND TIBC - Abnormal; Notable for the following components:   Iron 26 (*)    TIBC 228 (*)    All other components within normal limits  RESP PANEL BY RT-PCR (FLU A&B, COVID) ARPGX2  FERRITIN  LACTATE DEHYDROGENASE  HAPTOGLOBIN  VITAMIN B12  URINALYSIS, COMPLETE (UACMP) WITH MICROSCOPIC  FOLATE  POC OCCULT BLOOD, ED  TYPE AND SCREEN  PREPARE RBC (CROSSMATCH)    EKG None  Radiology CT Head Wo Contrast  Result Date: 05/31/2021 CLINICAL DATA:  Head trauma EXAM: CT HEAD WITHOUT CONTRAST CT MAXILLOFACIAL WITHOUT CONTRAST TECHNIQUE: Multidetector CT imaging of the head and maxillofacial structures were performed using the standard protocol without intravenous contrast. Multiplanar CT image reconstructions of the maxillofacial structures were also generated. RADIATION DOSE REDUCTION: This exam was performed according to the departmental dose-optimization program which includes automated exposure control, adjustment of the mA and/or kV according to patient size and/or use of iterative reconstruction technique. COMPARISON:  Brain MRI 03/22/2018, CT head and maxillofacial CT 10/10/2018 FINDINGS: CT HEAD FINDINGS Brain: There is no evidence of acute intracranial hemorrhage, extra-axial fluid collection, or acute infarct. Background parenchymal volume is normal for age. The ventricles are stable in size. There is a mild burden of white matter microangiopathic change. There is no mass lesion. There is no midline shift. Vascular: There is calcification of the bilateral cavernous ICAs. Skull: Normal. Negative for fracture or focal lesion. Other: None. CT MAXILLOFACIAL FINDINGS Osseous: There is no acute facial bone fracture. There is no evidence of  mandibular dislocation. There is no suspicious osseous lesion. There is advanced degenerative change of the left temporomandibular joint. Orbits: Bilateral lens implants are in place. The globes and orbits are otherwise unremarkable. Sinuses: There is soap bubble appearing fluid in the left sphenoid sinus. The other paranasal sinuses are clear. Soft tissues: Unremarkable. IMPRESSION: 1. No acute intracranial hemorrhage or calvarial fracture. 2. No acute facial bone fracture. Electronically Signed   By: Valetta Mole M.D.   On: 05/31/2021 12:24   CT Cervical Spine Wo Contrast  Result Date: 05/31/2021 CLINICAL DATA:  Trauma EXAM: CT CERVICAL SPINE WITHOUT CONTRAST TECHNIQUE: Multidetector CT imaging of the cervical spine was  performed without intravenous contrast. Multiplanar CT image reconstructions were also generated. RADIATION DOSE REDUCTION: This exam was performed according to the departmental dose-optimization program which includes automated exposure control, adjustment of the mA and/or kV according to patient size and/or use of iterative reconstruction technique. COMPARISON:  Cervical spine CT 10/10/2018 FINDINGS: Alignment: There is grade 1 retrolisthesis C3 on C4 and C4 on C5, and grade 1 anterolisthesis C5 on C6 and C6 on C7, unchanged and likely degenerative in nature. There is no jumped or perched facets or other evidence of traumatic malalignment. Skull base and vertebrae: Skull base alignment is maintained. Vertebral body heights are preserved. There is no evidence of acute fracture. Soft tissues and spinal canal: No prevertebral fluid or swelling. No visible canal hematoma. Disc levels: There is bulky degenerative pannus about the dens without evidence of mass effect on the underlying cord. There is advanced multilevel intervertebral disc space narrowing and degenerative endplate change, most advanced at C3-C4 and C4-C5. There is advanced multilevel facet arthropathy, most advanced on the left at  C2-C3 and C5-C6. There is probable moderate spinal canal stenosis at C4-C5 and up to severe neural foraminal stenosis on the right at C3-C4 and on the left at C4-C5. Findings are overall not significantly changed. Upper chest: The imaged lung apices are clear. Other: There is a hypodense right thyroid nodule measuring up to 1.2 cm, not significantly changed since 10/10/2018. IMPRESSION: 1. No acute fracture or traumatic malalignment of the cervical spine. 2. Advanced multilevel degenerative changes and degenerative spondylolisthesis as detailed above. Electronically Signed   By: Valetta Mole M.D.   On: 05/31/2021 12:30   DG Knee Complete 4 Views Right  Result Date: 05/31/2021 CLINICAL DATA:  Fall. Found on floor. Right knee pain. EXAM: RIGHT KNEE - COMPLETE 4+ VIEW COMPARISON:  Right knee radiographs 07/25/2017. FINDINGS: The right knee is located. Progressive Vance tricompartmental degenerative changes are present. No significant effusion is present. Vascular calcifications are noted. IMPRESSION: 1. Progressive tricompartmental degenerative changes of the right knee. 2. No acute abnormality. 3. Atherosclerosis. Electronically Signed   By: San Morelle M.D.   On: 05/31/2021 11:53   DG Hip Unilat W or Wo Pelvis 2-3 Views Right  Result Date: 05/31/2021 CLINICAL DATA:  Fall. Found on floor. Right hip pain. EXAM: DG HIP (WITH OR WITHOUT PELVIS) 2-3V RIGHT COMPARISON:  None. FINDINGS: The right hip is located. Moderate asymmetric degenerative changes are present. No acute fracture is present. Visualized pelvis demonstrates no acute fracture. Degenerative changes are present in the lower lumbar spine. IMPRESSION: 1. No acute abnormality. 2. Moderate asymmetric degenerative changes of the right hip. Electronically Signed   By: San Morelle M.D.   On: 05/31/2021 11:52   CT Maxillofacial Wo Contrast  Result Date: 05/31/2021 CLINICAL DATA:  Head trauma EXAM: CT HEAD WITHOUT CONTRAST CT  MAXILLOFACIAL WITHOUT CONTRAST TECHNIQUE: Multidetector CT imaging of the head and maxillofacial structures were performed using the standard protocol without intravenous contrast. Multiplanar CT image reconstructions of the maxillofacial structures were also generated. RADIATION DOSE REDUCTION: This exam was performed according to the departmental dose-optimization program which includes automated exposure control, adjustment of the mA and/or kV according to patient size and/or use of iterative reconstruction technique. COMPARISON:  Brain MRI 03/22/2018, CT head and maxillofacial CT 10/10/2018 FINDINGS: CT HEAD FINDINGS Brain: There is no evidence of acute intracranial hemorrhage, extra-axial fluid collection, or acute infarct. Background parenchymal volume is normal for age. The ventricles are stable in size. There is a mild  burden of white matter microangiopathic change. There is no mass lesion. There is no midline shift. Vascular: There is calcification of the bilateral cavernous ICAs. Skull: Normal. Negative for fracture or focal lesion. Other: None. CT MAXILLOFACIAL FINDINGS Osseous: There is no acute facial bone fracture. There is no evidence of mandibular dislocation. There is no suspicious osseous lesion. There is advanced degenerative change of the left temporomandibular joint. Orbits: Bilateral lens implants are in place. The globes and orbits are otherwise unremarkable. Sinuses: There is soap bubble appearing fluid in the left sphenoid sinus. The other paranasal sinuses are clear. Soft tissues: Unremarkable. IMPRESSION: 1. No acute intracranial hemorrhage or calvarial fracture. 2. No acute facial bone fracture. Electronically Signed   By: Valetta Mole M.D.   On: 05/31/2021 12:24    Procedures .Critical Care Performed by: Anselmo Pickler, PA-C Authorized by: Anselmo Pickler, PA-C   Critical care provider statement:    Critical care time (minutes):  32   Critical care was necessary to  treat or prevent imminent or life-threatening deterioration of the following conditions:  Circulatory failure (symptomatic anemia requiring transfusion)   Critical care was time spent personally by me on the following activities:  Development of treatment plan with patient or surrogate, discussions with consultants, evaluation of patient's response to treatment, examination of patient, ordering and review of laboratory studies, ordering and review of radiographic studies, ordering and performing treatments and interventions, pulse oximetry, re-evaluation of patient's condition and review of old charts    Medications Ordered in ED Medications  0.9 %  sodium chloride infusion (10 mL/hr Intravenous New Bag/Given 05/31/21 1442)  sodium chloride 0.9 % bolus 1,000 mL (1,000 mLs Intravenous New Bag/Given 05/31/21 1441)    ED Course/ Medical Decision Making/ A&P Clinical Course as of 05/31/21 1543  Tue May 31, 2021  1306 Brother reports he is helping with her decision making [CP]  1312 Creatinine markedly elevated 1.88, no recent baseline on file, unclear whether chronic kidney disease or acute kidney injury [CP]  41 Dr. Marylyn Ishihara to admit   [CP]    Clinical Course User Index [CP] Anselmo Pickler, PA-C                           Medical Decision Making Amount and/or Complexity of Data Reviewed Labs: ordered. Radiology: ordered.  Risk Prescription drug management. Decision regarding hospitalization.   I discussed this case with my attending physician who cosigned this note including patient's presenting symptoms, physical exam, and planned diagnostics and interventions. Attending physician stated agreement with plan or made changes to plan which were implemented.   This patient presents to the ED for concern of fall, unclear downtime, this involves an extensive number of treatment options, and is a complaint that carries with it a high risk of complications and morbidity. The emergent  differential diagnosis prior to evaluation includes, but is not limited to cardiogenic syncope, ACS, acute intracranial abnormality, symptomatic anemia, electrolyte abnormality, acute traumatic fracture, or dislocation, versus other this is not an exhaustive differential.   Past Medical History / Co-morbidities: Hypertension, GERD, dementia, hypercholesterolemia, history of endometrial cancer  Additional history: Additional history obtained from patient's brother, EMS. External records from outside source obtained and reviewed including outpatient PCP visits including encounter for CKD stage 4.  Physical Exam: Physical exam performed. The pertinent findings include: Of the right hip, right knee with no step-off or deformity, leg length discrepancy.  Neurovascularly intact exam of all  4 extremities.  Patient with no focal neurodeficits noted, but neurologic exam somewhat limited by demented state.  Lab Tests: I ordered, and personally interpreted labs.  The pertinent results include: Notable anemia with hemoglobin of 6.1, not macrocytic or microcytic, unclear cause.  Patient has negative Hemoccult.  Her BMP is remarkable for creatinine elevated to 1.88 this is consistent with her known CKD stage IV, but unclear what her current baseline is, most recent lab work on file from 6 years ago shows creatinine of 1.26.    Imaging Studies: I ordered imaging studies including CT cervical spine, CT head, CT maxillofacial, plain film radiograph of the knee and right hip. I independently visualized and interpreted imaging which showed degenerative arthritis changes of the knee and right hip, no other evidence of acute intracranial abnormality, cervical spine abnormality, or facial bone fracture. I agree with the radiologist interpretation.   Cardiac Monitoring:  The patient was maintained on a cardiac monitor.  My attending physician Dr. Jeanell Sparrow viewed and interpreted the cardiac monitored which showed an underlying  rhythm of: Possible ectopic atrial bradycardia with no evidence of ischemia noted   Medications: I ordered medication including red blood cells for symptomatic anemia.   Consultations Obtained: I requested consultation with the hospitalist,  and discussed lab and imaging findings as well as pertinent plan - they recommend: Admission for treatment of symptomatic anemia, further evaluation of cause of fall, and rule out rhabdo, AKI, etc.  Patient and brother informed of plan for transfusion, admission.  Patient remains pleasantly demented.  Final Clinical Impression(s) / ED Diagnoses Final diagnoses:  None    Rx / DC Orders ED Discharge Orders     None         Dorien Chihuahua 05/31/21 1544    Pattricia Boss, MD 06/02/21 1322

## 2021-05-31 NOTE — H&P (Signed)
History and Physical    Patient: [name] DOB: July 02, 1932 DOA: 05/31/2021 DOS: the patient was seen and examined on 05/31/2021 PCP: Burnard Bunting, MD  Patient coming from: Home  Chief Complaint:  Chief Complaint  Patient presents with   Fall    HPI: [name] is a 88 y.o. female with medical history significant of CKD4, demential, anemia, HLD, HTN. History from brother by phone. Patient lives alone but her brother looks in on her. Seemed to be in her normal state of health til this morning. He went to get her breakfast. When he got to her house, he could hear someone calling help. After he was able to gain access, he found her lying on the floor on her back. He is not sure how long she was down. She had vomited on the floor. He helped her up and called for EMS.   Of note, the brother reports that her PCP has diagnosed her with anemia and sent her home with "vitamins". However, he never picked them up for the patient as he didn't think she needed them. Per his report, she only take donepezil 62m qday. He says he brings her a sausage biscuit and tea practically every morning. She has the ingredients necessary to make herself sandwiches throughout the day, but he is not aware of if she does so or not. He denies any other aggravating or alleviating factors.   Review of Systems: unable to review all systems due to the inability of the patient to answer questions. Past Medical History:  Diagnosis Date   Arthritis    Baker cyst    a. 04/2014 on right.   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    History of  gallstones    History of kidney stones    Hypercholesterolemia    Obesity    TIA (transient ischemic attack)    Varicose veins    Past Surgical History:  Procedure Laterality Date   CATARACT EXTRACTION Bilateral 2005   CATARACT EXTRACTION W/ INTRAOCULAR LENS  IMPLANT, BILATERAL     CHOLECYSTECTOMY     CHOLECYSTECTOMY, LAPAROSCOPIC  2002   COLONOSCOPY     EYE SURGERY     HYSTEROSCOPY WITH D & C N/A 12/18/2013   Procedure: DILATATION AND CURETTAGE /HYSTEROSCOPY;  Surgeon: AGus Height MD;  [facility];  Service: Gynecology;  Laterality: N/A;   LUMBAR DISC SURGERY Right 1998   L1-L2   LYMPH NODE DISSECTION N/A 02/17/2014   Procedure: LYMPH NODE DISSECTION;  Surgeon: PImagene GurneyA. GAlycia Rossetti MD;  Location: [facility];  Service: Gynecology;  Laterality: N/A;   ROBOTIC ASSISTED TOTAL HYSTERECTOMY WITH BILATERAL SALPINGO OOPHERECTOMY N/A 02/17/2014   with Lymph node dissection ; Surgeon: PImagene GurneyA. GAlycia Rossetti MD;  Location: [facility];  Service: Gynecology;  Laterality: N/A;   ROTATOR CUFF REPAIR Bilateral 2003   SKIN BIOPSY     Four times last 2005 - nose cancer   TENDON GRAFT  2003   Bone graft   TONSILLECTOMY AND ADENOIDECTOMY  1958   WISDOM TOOTH EXTRACTION  Social History:  reports that she has never smoked. She has never used smokeless tobacco. She reports that she does not drink alcohol and does not use drugs.  Allergies  Allergen Reactions   Benadryl [Diphenhydramine Hcl (Sleep)] Other (See Comments)    Light headed, heart racing.    Ciprofloxacin Other (See Comments)    Leg pains   Pravastatin Other (See Comments)    Leg cramps   Vioxx [Rofecoxib] Nausea Only   Cephalosporins Rash    Family History  Problem Relation Age of Onset   Heart disease Sister    Diabetes Brother        x 4   Colon cancer Sister        dx in her 4s   Diabetes Sister    Crohn's disease Other        Nephew   Colon cancer Maternal Aunt    Colon cancer Maternal Uncle    Heart disease Brother    Colon  polyps Other        Nephew   Leukemia Sister    Diabetes Sister    Melanoma Other 62       niece   Lung cancer Other    Leukemia Other        great niece    Prior to Admission medications   Medication Sig Start Date End Date Taking? Authorizing Provider  esomeprazole (NEXIUM) 40 MG capsule Take 1 capsule (40 mg total) by mouth daily. 06/13/11 06/23/11  Sable Feil, MD  Linaclotide Rolan Lipa) 145 MCG CAPS Take 1 capsule by mouth daily. 06/13/11 06/23/11  Sable Feil, MD    Physical Exam: Vitals:   05/31/21 1036 05/31/21 1042 05/31/21 1315  BP: 99/78  (!) 114/53  Pulse: 65  (!) 52  Resp: 16  16  Temp: 98.2 F (36.8 C)    TempSrc: Axillary    SpO2: 100% 98% 100%   General: 88 y.o. female resting in bed in NAD Eyes: PERRL, normal sclera ENMT: Nares patent w/o discharge, orophaynx clear, dentition normal, ears w/o discharge/lesions/ulcers Neck: Supple, trachea midline Cardiovascular: RRR, +S1, S2, no m/g/r, equal pulses throughout Respiratory: CTABL, no w/r/r, normal WOB GI: BS+, NDNT, no masses noted, no organomegaly noted MSK: No e/c/c Neuro: Alert and interactive; she is EXTREMELY hard of hearing Psyc: pleasantly confused, calm/cooperative   Data Reviewed:  CO2 21 BUN 38 Scr 1.88 Hgb 6.1  XR right knee: 1. Progressive tricompartmental degenerative changes of the right knee. 2. No acute abnormality. CTH/maxillofacial: 1. No acute intracranial hemorrhage or calvarial fracture. 2. No acute facial bone fracture. CT c-spine: 1. No acute fracture or traumatic malalignment of the cervical spine.  Hemoccult: negative  Assessment and Plan: No notes have been filed under this hospital service. Service: Hospitalist Symptomatic anemia Fall     - place in [place], tele     - check iron, TIBC, B12, LDH, folate     - 1 unit pRBCs ordered in ED (brother consented)     - trend H&H     - protonix     - she can't tell me if she passed out or not; so will also check echo  and keep on tele for now     - check UA  Dementia     - her brother reports that she takes donepezil 20m qday; resume home regimen   CKD4     - looks like she is followed outpt for CKD4; by GFR she would be  at baseline     - watch nephrotoxins  Advance Care Planning:   Code Status: FULL  Consults: None  Family Communication: w/ brother by phone  Severity of Illness: The appropriate patient status for this patient is OBSERVATION. Observation status is judged to be reasonable and necessary in order to provide the required intensity of service to ensure the patient's safety. The patient's presenting symptoms, physical exam findings, and initial radiographic and laboratory data in the context of their medical condition is felt to place them at decreased risk for further clinical deterioration. Furthermore, it is anticipated that the patient will be medically stable for discharge from the hospital within 2 midnights of admission.   Author: Jonnie Finner, DO 05/31/2021 2:03 PM  For on call review [email or web address].

## 2021-05-31 NOTE — ED Triage Notes (Signed)
Pt coming from home via EMS where brother found pt on the floor with vomit next to her. Unsure how long she was on the floor. Pt seems to have some rt. knee and hip pain on assessment. Hx dementia. Oriented to self and knows who her brother is, this reportedly is her baseline. Pt is HOH.

## 2021-05-31 NOTE — ED Notes (Signed)
Pts brother Mi Balla) req phone call for update [phone]

## 2021-05-31 NOTE — ED Notes (Signed)
Blood bank has blood ready for this patient. Informed Hannah,RN.

## 2021-06-01 ENCOUNTER — Observation Stay (HOSPITAL_BASED_OUTPATIENT_CLINIC_OR_DEPARTMENT_OTHER): Payer: PPO

## 2021-06-01 DIAGNOSIS — R55 Syncope and collapse: Secondary | ICD-10-CM

## 2021-06-01 DIAGNOSIS — D649 Anemia, unspecified: Secondary | ICD-10-CM | POA: Diagnosis not present

## 2021-06-01 LAB — COMPREHENSIVE METABOLIC PANEL
ALT: 11 U/L (ref 0–44)
AST: 20 U/L (ref 15–41)
Albumin: 2.6 g/dL — ABNORMAL LOW (ref 3.5–5.0)
Alkaline Phosphatase: 37 U/L — ABNORMAL LOW (ref 38–126)
Anion gap: 2 — ABNORMAL LOW (ref 5–15)
BUN: 37 mg/dL — ABNORMAL HIGH (ref 8–23)
CO2: 21 mmol/L — ABNORMAL LOW (ref 22–32)
Calcium: 8.2 mg/dL — ABNORMAL LOW (ref 8.9–10.3)
Chloride: 115 mmol/L — ABNORMAL HIGH (ref 98–111)
Creatinine, Ser: 1.8 mg/dL — ABNORMAL HIGH (ref 0.44–1.00)
GFR, Estimated: 27 mL/min — ABNORMAL LOW (ref >60)
Glucose, Bld: 84 mg/dL (ref 70–99)
Potassium: 5.4 mmol/L — ABNORMAL HIGH (ref 3.5–5.1)
Sodium: 138 mmol/L (ref 135–145)
Total Bilirubin: 0.7 mg/dL (ref 0.3–1.2)
Total Protein: 5.3 g/dL — ABNORMAL LOW (ref 6.5–8.1)

## 2021-06-01 LAB — CBC
HCT: 20.4 % — ABNORMAL LOW (ref 36.0–46.0)
Hemoglobin: 6.8 g/dL — CL (ref 12.0–15.0)
MCH: 30.2 pg (ref 26.0–34.0)
MCHC: 33.3 g/dL (ref 30.0–36.0)
MCV: 90.7 fL (ref 80.0–100.0)
Platelets: 149 10*3/uL — ABNORMAL LOW (ref 150–400)
RBC: 2.25 MIL/uL — ABNORMAL LOW (ref 3.87–5.11)
RDW: 18.7 % — ABNORMAL HIGH (ref 11.5–15.5)
WBC: 5.3 10*3/uL (ref 4.0–10.5)
nRBC: 0 % (ref 0.0–0.2)

## 2021-06-01 LAB — ECHOCARDIOGRAM COMPLETE
AR max vel: 3.09 cm2
AV Peak grad: 6.8 mmHg
Ao pk vel: 1.3 m/s
Area-P 1/2: 3.68 cm2
Height: 62 in
P 1/2 time: 897 msec
S' Lateral: 3 cm
Weight: 1622.59 oz

## 2021-06-01 LAB — PREPARE RBC (CROSSMATCH)

## 2021-06-01 LAB — HEMOGLOBIN AND HEMATOCRIT, BLOOD
HCT: 28 % — ABNORMAL LOW (ref 36.0–46.0)
Hemoglobin: 9.3 g/dL — ABNORMAL LOW (ref 12.0–15.0)

## 2021-06-01 LAB — HAPTOGLOBIN: Haptoglobin: 117 mg/dL (ref 41–333)

## 2021-06-01 MED ORDER — FERROUS SULFATE 325 (65 FE) MG PO TABS
325.0000 mg | ORAL_TABLET | Freq: Three times a day (TID) | ORAL | Status: DC
  Administered 2021-06-01 – 2021-06-06 (×15): 325 mg via ORAL
  Filled 2021-06-01 (×16): qty 1

## 2021-06-01 MED ORDER — SODIUM CHLORIDE 0.9% IV SOLUTION: Freq: Once | INTRAVENOUS | Status: AC

## 2021-06-01 MED ORDER — CYANOCOBALAMIN 1000 MCG/ML IJ SOLN
1000.0000 ug | Freq: Once | INTRAMUSCULAR | Status: AC
  Administered 2021-06-01: 1000 ug via SUBCUTANEOUS
  Filled 2021-06-01: qty 1

## 2021-06-01 MED ORDER — PANTOPRAZOLE SODIUM 40 MG IV SOLR
40.0000 mg | Freq: Two times a day (BID) | INTRAVENOUS | Status: DC
  Administered 2021-06-01 – 2021-06-02 (×4): 40 mg via INTRAVENOUS
  Filled 2021-06-01 (×5): qty 10

## 2021-06-01 NOTE — Progress Notes (Signed)
Date and time results received: 06/01/21 0751 (use smartphrase ".now" to insert current time)  Test: hgb Critical Value: 6.8  Name of Provider Notified: Posey Pronto  Orders Received? Or Actions Taken?: Actions Taken:   1 unit of blood ordered

## 2021-06-01 NOTE — Progress Notes (Signed)
°  Progress Note   Patient: [name] ZOX:[id number] DOB: [birth year] DOA: 05/31/2021     Hospitalization day: 0 DOS: the patient was seen and examined on 06/01/2021   Brief hospital course:  [name] is a 88 y.o. female with medical history significant of CKD4, demential, anemia, HLD, HTN. History from brother by phone. Patient lives alone but her brother looks in on her.   Assessment and Plan: Principal Problem:   Symptomatic anemia No evidence of acute bleeding. Likely cause of patient's possible fall. Continue transfusion for hemoglobin less than 7. Monitor FOBT although negative so far. On PPI and clear liquid diet for now.  Unwitnessed fall. Etiology not clear. Possibility of anemia causing the fall possibility of bradycardia causing the fall. Further work-up initiated with echocardiogram monitor on telemetry. PT OT consulted as well.    Dementia without behavioral disturbance ([place]) Lives alone. No focal deficit. No behavioral issues no agitation. Monitor.    CKD (chronic kidney disease) stage 4, GFR 15-29 ml/min (HCC) Renal function at baseline. Monitor.  Subjective: No nausea no vomiting no fever no chills.  No chest pain abdominal pain.  Physical Exam: Vitals:   06/01/21 1143 06/01/21 1420 06/01/21 1518 06/01/21 1935  BP: (!) 108/50 (!) 107/55 (!) 102/54 (!) 109/55  Pulse: (!) 36 (!) 41 (!) 43 (!) 47  Resp: 18 17  18   Temp: 98.7 F (37.1 C) 97.8 F (36.6 C) 99 F (37.2 C) (!) 100.7 F (38.2 C)  TempSrc: Oral Oral Oral   SpO2: 100% 100% 98% 99%  Weight:      Height:       General: Appear in mild distress; no visible Abnormal Neck Mass Or lumps, Conjunctiva normal Cardiovascular: S1 and S2 Present, aortic systolic  Murmur, Respiratory: good respiratory effort, Bilateral Air entry present and CTA, no Crackles, no wheezes Abdomen: Bowel Sound present Extremities: no Pedal edema Neurology: alert and oriented to place and person Gait not checked due to  patient safety concerns   Data Reviewed:  I have Reviewed nursing notes, Vitals, and Lab results since pt's last encounter. Pertinent lab results CBC, BMP I have ordered test including CBC, BMP I have independently visualized and interpreted EKG which showed EKG: nonspecific ST and T waves changes, sinus tachycardia.   Family Communication: Nephew at bedside  Disposition: Status is: Observation  Author: Berle Mull, MD 06/01/2021 8:35 PM  For on call review [email or web address].

## 2021-06-01 NOTE — Evaluation (Signed)
Physical Therapy Evaluation Patient Details Name: [name] MRN: [id number] DOB: 1932-11-02 Today's Date: 06/01/2021  History of Present Illness  88 yo femaler admitted with anemia, fall, R LE pain. Pt was found down at home by family. Hx of dementia, falls  Clinical Impression  On eval, pt was Mod A for mobility. She walked ~25 feet with a RW. Mobility is limited by R LE pain. Per chart review, pt lives alone. Unfortunately, she is not able to manage at home alone in her current state. PT recommendation is for ST SNF rehab. Will plan to follow and progress activity as tolerated.      Recommendations for follow up therapy are one component of a multi-disciplinary discharge planning process, led by the attending physician.  Recommendations may be updated based on patient status, additional functional criteria and insurance authorization.  Follow Up Recommendations Skilled nursing-short term rehab (<3 hours/day)    Assistance Recommended at Discharge Frequent or constant Supervision/Assistance  Patient can return home with the following  A little help with walking and/or transfers;A little help with bathing/dressing/bathroom;Assist for transportation;Assistance with cooking/housework;Help with stairs or ramp for entrance    Equipment Recommendations Rolling walker (2 wheels)  Recommendations for Other Services       Functional Status Assessment Patient has had a recent decline in their functional status and demonstrates the ability to make significant improvements in function in a reasonable and predictable amount of time.     Precautions / Restrictions Precautions Precautions: Fall Restrictions Weight Bearing Restrictions: No      Mobility  Bed Mobility Overal bed mobility: Needs Assistance Bed Mobility: Supine to Sit, Sit to Supine     Supine to sit: Mod assist, HOB elevated Sit to supine: Mod assist, HOB elevated   General bed mobility comments: Assist for trunk and  LEs. Increased time. Task is efforful/difficulty 2* pain. Multimodal cueing required.    Transfers Overall transfer level: Needs assistance Equipment used: Rolling walker (2 wheels) Transfers: Sit to/from Stand, Bed to chair/wheelchair/BSC Sit to Stand: Min assist, From elevated surface   Step pivot transfers: Min assist       General transfer comment: Assist to rise, steady, control descent. Multimodal cueing for safety, technique.    Ambulation/Gait Ambulation/Gait assistance: Min assist Gait Distance (Feet): 25 Feet Assistive device: Rolling walker (2 wheels) Gait Pattern/deviations: Step-through pattern, Decreased stride length, Trunk flexed, Antalgic       General Gait Details: Gait is antalgic 2* R LE pain. Assist to stabilize pt and manage RW.  Stairs            Wheelchair Mobility    Modified Rankin (Stroke Patients Only)       Balance Overall balance assessment: Needs assistance         Standing balance support: Bilateral upper extremity supported Standing balance-Leahy Scale: Poor                               Pertinent Vitals/Pain Pain Assessment Pain Assessment: Faces Faces Pain Scale: Hurts even more Pain Location: R LE with activity Pain Descriptors / Indicators: Discomfort, Grimacing, Guarding, Moaning Pain Intervention(s): Limited activity within patient's tolerance, Monitored during session, Repositioned    Home Living Family/patient expects to be discharged to:: Unsure Living Arrangements: Alone Available Help at Discharge: Family;Available PRN/intermittently Type of Home: House Home Access: Stairs to enter Entrance Stairs-Rails: None Entrance Stairs-Number of Steps: 2   Home Layout: One level Home  Equipment: Kasandra Knudsen - single point      Prior Function Prior Level of Function : Independent/Modified Independent             Mobility Comments: uses cane PRN       Hand Dominance        Extremity/Trunk  Assessment   Upper Extremity Assessment Upper Extremity Assessment: Defer to OT evaluation    Lower Extremity Assessment Lower Extremity Assessment: Generalized weakness;RLE deficits/detail RLE Deficits / Details: joint deformities (arthritic?) at knee and ankle/foot joints    Cervical / Trunk Assessment Cervical / Trunk Assessment: Kyphotic  Communication   Communication:  (very HOH)  Cognition Arousal/Alertness: Awake/alert Behavior During Therapy: WFL for tasks assessed/performed Overall Cognitive Status: History of cognitive impairments - at baseline                                 General Comments: pt follows multimodal commands-mostly needed due to hearing        General Comments      Exercises     Assessment/Plan    PT Assessment Patient needs continued PT services  PT Problem List Decreased strength;Decreased mobility;Decreased range of motion;Decreased activity tolerance;Decreased balance;Decreased knowledge of use of DME;Pain;Decreased cognition       PT Treatment Interventions DME instruction;Gait training;Therapeutic exercise;Balance training;Functional mobility training;Therapeutic activities;Patient/family education    PT Goals (Current goals can be found in the Care Plan section)  Acute Rehab PT Goals Patient Stated Goal: less pain PT Goal Formulation: With patient Time For Goal Achievement: 06/15/21 Potential to Achieve Goals: Fair    Frequency Min 2X/week     Co-evaluation               AM-PAC PT "6 Clicks" Mobility  Outcome Measure Help needed turning from your back to your side while in a flat bed without using bedrails?: A Lot Help needed moving from lying on your back to sitting on the side of a flat bed without using bedrails?: A Lot Help needed moving to and from a bed to a chair (including a wheelchair)?: A Little Help needed standing up from a chair using your arms (e.g., wheelchair or bedside chair)?: A Little Help  needed to walk in hospital room?: A Little Help needed climbing 3-5 steps with a railing? : Total 6 Click Score: 14    End of Session Equipment Utilized During Treatment: Gait belt Activity Tolerance: Patient limited by fatigue;Patient limited by pain Patient left: in bed;with call bell/phone within reach;with bed alarm set;with family/visitor present   PT Visit Diagnosis: History of falling (Z91.81);Muscle weakness (generalized) (M62.81);Difficulty in walking, not elsewhere classified (R26.2);Pain Pain - Right/Left: Right Pain - part of body: Leg;Hip;Knee    Time: 1610-9604 PT Time Calculation (min) (ACUTE ONLY): 22 min   Charges:   PT Evaluation $PT Eval Moderate Complexity: 1 Mod             Doreatha Massed, PT Acute Rehabilitation  Office: [phone] Pager: [phone]

## 2021-06-01 NOTE — Progress Notes (Signed)
Echocardiogram 2D Echocardiogram has been performed.  [name] 06/01/2021, 10:57 AM

## 2021-06-01 NOTE — Plan of Care (Signed)

## 2021-06-01 NOTE — Progress Notes (Incomplete)
°   06/01/21 1118  Assess: MEWS Score  Temp 98.6 F (37 C)  BP (!) 103/49  Pulse Rate (!) 38  Resp 20  SpO2 100 %  Assess: MEWS Score  MEWS Temp 0  MEWS Systolic 0  MEWS Pulse 2  MEWS RR 0  MEWS LOC 0  MEWS Score 2  MEWS Score Color Yellow  Assess: if the MEWS score is Yellow or Red  Were vital signs taken at a resting state? Yes  Focused Assessment No change from prior assessment  Does the patient meet 2 or more of the SIRS criteria? No  MEWS guidelines implemented *See Row Information* Yes  Take Vital Signs  Increase Vital Sign Frequency  Yellow: Q 2hr X 2 then Q 4hr X 2, if remains yellow, continue Q 4hrs  Escalate  MEWS: Escalate Yellow: discuss with charge nurse/RN and consider discussing with provider and RRT  Notify: Charge Nurse/RN  Name of Charge Nurse/RN Notified Charge  Date Charge Nurse/RN Notified 06/01/21  Time Charge Nurse/RN Notified 1120  Notify: Provider  Provider Name/Title Posey Pronto  Date Provider Notified 06/01/21  Time Provider Notified 1120  Notification Type Face-to-face  Notification Reason Other (Comment) (yellow mews)  Provider response At bedside;See new orders (ekg)  Date of Provider Response 06/01/21  Time of Provider Response 1122  Document  Patient Outcome Stabilized after interventions  Progress note created (see row info) Yes  Assess: SIRS CRITERIA  SIRS Temperature  0  SIRS Pulse 0  SIRS Respirations  0  SIRS WBC 0  SIRS Score Sum  0

## 2021-06-02 DIAGNOSIS — D649 Anemia, unspecified: Secondary | ICD-10-CM | POA: Diagnosis not present

## 2021-06-02 LAB — BPAM RBC
Blood Product Expiration Date: 202303042359
Blood Product Expiration Date: 202303042359
ISSUE DATE / TIME: 202302211413
ISSUE DATE / TIME: 202302221122
Unit Type and Rh: 7300
Unit Type and Rh: 7300

## 2021-06-02 LAB — TYPE AND SCREEN
ABO/RH(D): B POS
Antibody Screen: NEGATIVE
Unit division: 0
Unit division: 0

## 2021-06-02 LAB — BASIC METABOLIC PANEL
Anion gap: 3 — ABNORMAL LOW (ref 5–15)
BUN: 39 mg/dL — ABNORMAL HIGH (ref 8–23)
CO2: 19 mmol/L — ABNORMAL LOW (ref 22–32)
Calcium: 8.4 mg/dL — ABNORMAL LOW (ref 8.9–10.3)
Chloride: 111 mmol/L (ref 98–111)
Creatinine, Ser: 1.83 mg/dL — ABNORMAL HIGH (ref 0.44–1.00)
GFR, Estimated: 27 mL/min — ABNORMAL LOW (ref >60)
Glucose, Bld: 77 mg/dL (ref 70–99)
Potassium: 4.4 mmol/L (ref 3.5–5.1)
Sodium: 133 mmol/L — ABNORMAL LOW (ref 135–145)

## 2021-06-02 LAB — CBC
HCT: 27.5 % — ABNORMAL LOW (ref 36.0–46.0)
Hemoglobin: 8.9 g/dL — ABNORMAL LOW (ref 12.0–15.0)
MCH: 30.1 pg (ref 26.0–34.0)
MCHC: 32.4 g/dL (ref 30.0–36.0)
MCV: 92.9 fL (ref 80.0–100.0)
Platelets: 144 10*3/uL — ABNORMAL LOW (ref 150–400)
RBC: 2.96 MIL/uL — ABNORMAL LOW (ref 3.87–5.11)
RDW: 18.1 % — ABNORMAL HIGH (ref 11.5–15.5)
WBC: 5.2 10*3/uL (ref 4.0–10.5)
nRBC: 0 % (ref 0.0–0.2)

## 2021-06-02 LAB — MAGNESIUM: Magnesium: 1.9 mg/dL (ref 1.7–2.4)

## 2021-06-02 MED ORDER — DICLOFENAC SODIUM 1 % EX GEL
2.0000 g | Freq: Two times a day (BID) | CUTANEOUS | Status: DC
Start: 2021-06-02 — End: 2021-06-06
  Administered 2021-06-02 – 2021-06-06 (×9): 2 g via TOPICAL
  Filled 2021-06-02: qty 100

## 2021-06-02 NOTE — Progress Notes (Signed)
°  Progress Note   Patient: [name] ENM:[id number] DOB: February 16, 1933 DOA: 05/31/2021     Hospitalization day: 0 DOS: the patient was seen and examined on 06/02/2021   Brief hospital course:  [name] is a 88 y.o. female with medical history significant of CKD4, demential, anemia, HLD, HTN. History from brother by phone. Patient lives alone but her brother looks in on her.   Assessment and Plan: Symptomatic anemia No evidence of acute bleeding. Likely cause of patient's possible fall. Continue transfusion for hemoglobin less than 7. Monitor FOBT although negative so far. On PPI and clear liquid diet for now.  Unwitnessed fall. Etiology not clear. Possibility of anemia causing the fall possibility of bradycardia causing the fall. While the patient does have bradycardia he does not appear to have any symptoms here in the hospital or any abnormal heart block on telemetry. Echocardiogram shows preserved EF, no acute valvular abnormality. Monitor for now    Dementia without behavioral disturbance ([place]) Lives alone. No focal deficit. No behavioral issues no agitation. Monitor. PT recommends SNF.  Unsafe to discharge back home.    CKD (chronic kidney disease) stage 4, GFR 15-29 ml/min (HCC) Renal function at baseline. Monitor.  Subjective: No nausea no vomiting.  Diet advanced tolerating well.  No fever no chills.  Physical Exam: Vitals:   06/01/21 1935 06/01/21 2320 06/02/21 0450 06/02/21 2054  BP: (!) 109/55 (!) 98/51 119/63 105/62  Pulse: (!) 47 (!) 48 (!) 46 (!) 46  Resp: 18 17 15 17   Temp: (!) 100.7 F (38.2 C) 98.9 F (37.2 C) 97.6 F (36.4 C) 98.5 F (36.9 C)  TempSrc:      SpO2: 99% 97% 100% 97%  Weight:      Height:       General: Appear in mild distress, no Rash; Oral Mucosa Clear, moist. no Abnormal Neck Mass Or lumps, Conjunctiva normal  Cardiovascular: S1 and S2 Present, no Murmur, Respiratory: good respiratory effort, Bilateral Air entry present and  CTA, no Crackles, no wheezes Abdomen: Bowel Sound present, Soft and no tenderness Extremities: no Pedal edema Neurology: alert and oriented to self affect appropriate. no new focal deficit Gait not checked due to patient safety concerns   Data Reviewed: I have Reviewed nursing notes, Vitals, and Lab results since pt's last encounter. Pertinent lab results CBC and BMP I have ordered test including CBC and BMP    Family Communication: No family at bedside  Disposition: Status is: Observation  Author: Berle Mull, MD 06/02/2021 9:10 PM  For on call review [email or web address].

## 2021-06-02 NOTE — Evaluation (Signed)
Occupational Therapy Evaluation Patient Details Name: [name] MRN: [id number] DOB: [birth year] Today's Date: 06/02/2021   History of Present Illness 88 yo femaler admitted with anemia, fall, R LE pain. Pt was found down at home by family. Hx of dementia, falls   Clinical Impression   [name] is an 88 year old woman who presents with impaired cognition secondary to dementia, impaired balance, decreased activity tolerance and pain in right knee resulting in a decline in independence. At baseline patient lives alone and has assistance with finances and medical management. She has a brother who helps her with two meals a day but otherwise is independent with ADLs. Today patient required min guard for all mobility and min assist for ADLs as well as multimodal cues for safety. She has significant pain in her right knee limiting her mobility and making her at risk for falls. Patient will benefit from skilled OT services while in hospital to improve deficits and learn compensatory strategies as needed in order to return to PLOF. Therapist recommends short term rehab to maximize physical abilities and learn usage of compensatory strategies with DME. Patient is not safe to return home without near 24/7 assistance.       Recommendations for follow up therapy are one component of a multi-disciplinary discharge planning process, led by the attending physician.  Recommendations may be updated based on patient status, additional functional criteria and insurance authorization.   Follow Up Recommendations  Skilled nursing-short term rehab (<3 hours/day)    Assistance Recommended at Discharge Frequent or constant Supervision/Assistance  Patient can return home with the following A little help with walking and/or transfers;A little help with bathing/dressing/bathroom;Assistance with cooking/housework;Direct supervision/assist for financial management;Help with stairs or ramp for entrance    Functional  Status Assessment  Patient has had a recent decline in their functional status and demonstrates the ability to make significant improvements in function in a reasonable and predictable amount of time.  Equipment Recommendations  Tub/shower bench    Recommendations for Other Services       Precautions / Restrictions Precautions Precautions: Fall Precaution Comments: R knee pain Restrictions Weight Bearing Restrictions: No      Mobility Bed Mobility                    Transfers                          Balance Overall balance assessment: Needs assistance Sitting-balance support: No upper extremity supported, Feet supported Sitting balance-Leahy Scale: Fair     Standing balance support: Bilateral upper extremity supported Standing balance-Leahy Scale: Poor Standing balance comment: reliant on walker                           ADL either performed or assessed with clinical judgement   ADL Overall ADL's : Needs assistance/impaired Eating/Feeding: Set up;Sitting   Grooming: Set up;Sitting   Upper Body Bathing: Set up;Sitting   Lower Body Bathing: Minimal assistance;Sit to/from stand   Upper Body Dressing : Set up;Sitting   Lower Body Dressing: Minimal assistance   Toilet Transfer: Minimal assistance;Regular Toilet;Rolling walker (2 wheels);Grab bars;Cueing for safety Toilet Transfer Details (indicate cue type and reason): verbal cues for safety Toileting- Clothing Manipulation and Hygiene: Minimal assistance;Cueing for safety Toileting - Clothing Manipulation Details (indicate cue type and reason): to manage clothing, cueing for hand placement and safety     Functional  mobility during ADLs: Min guard;Rolling walker (2 wheels) General ADL Comments: Overall min guard to ambulate with walker to bathroom     Vision Baseline Vision/History: 1 Wears glasses Patient Visual Report: No change from baseline       Perception     Praxis       Pertinent Vitals/Pain Pain Assessment Pain Assessment: Faces Faces Pain Scale: Hurts even more Pain Location: R knee Pain Descriptors / Indicators: Discomfort, Grimacing, Guarding, Moaning Pain Intervention(s): Limited activity within patient's tolerance, Monitored during session     Hand Dominance Right   Extremity/Trunk Assessment Upper Extremity Assessment Upper Extremity Assessment: Overall WFL for tasks assessed   Lower Extremity Assessment Lower Extremity Assessment: Defer to PT evaluation   Cervical / Trunk Assessment Cervical / Trunk Assessment: Kyphotic   Communication Communication Communication: No difficulties   Cognition Arousal/Alertness: Awake/alert Behavior During Therapy: WFL for tasks assessed/performed Overall Cognitive Status: History of cognitive impairments - at baseline                                 General Comments: Patient HOH, able to follow commands, poor short term memory, conversation appropriate but probably not accurate.     General Comments       Exercises     Shoulder Instructions      Home Living Family/patient expects to be discharged to:: Skilled nursing facility Living Arrangements: Alone Available Help at Discharge: Family;Available PRN/intermittently Type of Home: House Home Access: Stairs to enter [employer] of Steps: 2 Entrance Stairs-Rails: None Home Layout: One level               Home Equipment: Conservation officer, nature (2 wheels)   Additional Comments: unsure of device used for ambulation - seemed to know how to use walker      Prior Functioning/Environment Prior Level of Function : Independent/Modified Independent             Mobility Comments: uses cane/walker PRN ADLs Comments: independent with ADLs. Brother assists with medication management and getting patient two meals a day (per nephew)        OT Problem List: Pain;Decreased cognition;Decreased safety awareness;Impaired  balance (sitting and/or standing);Decreased knowledge of use of DME or AE;Decreased activity tolerance      OT Treatment/Interventions: Self-care/ADL training;Therapeutic exercise;DME and/or AE instruction;Therapeutic activities;Balance training;Cognitive remediation/compensation;Patient/family education    OT Goals(Current goals can be found in the care plan section) Acute Rehab OT Goals OT Goal Formulation: Patient unable to participate in goal setting Time For Goal Achievement: 06/16/21 Potential to Achieve Goals: Good  OT Frequency: Min 2X/week    Co-evaluation              AM-PAC OT "6 Clicks" Daily Activity     Outcome Measure Help from another person eating meals?: A Little Help from another person taking care of personal grooming?: A Little Help from another person toileting, which includes using toliet, bedpan, or urinal?: A Little Help from another person bathing (including washing, rinsing, drying)?: A Little Help from another person to put on and taking off regular upper body clothing?: A Little Help from another person to put on and taking off regular lower body clothing?: A Little 6 Click Score: 18   End of Session Equipment Utilized During Treatment: Rolling walker (2 wheels);Gait belt Nurse Communication: Mobility status (R knee pain)  Activity Tolerance: Patient tolerated treatment well Patient left: in chair;with call bell/phone within reach;with  chair alarm set  OT Visit Diagnosis: History of falling (Z91.81);Pain                Time: 9791-5041 OT Time Calculation (min): 18 min Charges:  OT General Charges $OT Visit: 1 Visit OT Evaluation $OT Eval Low Complexity: 1 Low  Nagee Goates, OTR/L [place]  Office [phone] Pager: [place] 06/02/2021, 1:05 PM

## 2021-06-02 NOTE — TOC Initial Note (Addendum)
Transition of Care [facility]) - Initial/Assessment Note    Patient Details  Name: [name] MRN: [id number] Date of Birth: 29-Oct-1932  Transition [facility]) CM/SW Contact:    Trish Mage, LCSW Phone Number: 06/02/2021, 10:32 AM  Clinical Narrative:   Patient seen in follow up to PT recommendation of SNF.  [name] is [facility], nephew Gaspar Bidding spoke for her.  He states family is interested in rehab as [name] lives alone; Lupita Dawn brother goes to see her twice a day to prepare meals and clean up.  Bed search process explained and initiated.  Gaspar Bidding states that goal is for patient to return home.  I encouraged him to apply for MCD in case she is unable to return home.  He voiced understanding and promised to follow up on that today. TOC will continue to follow during the course of hospitalization.  Addendum: Patient has bed offer at [facility].  This is where nephew hoped she would get an offer as family is close by.  Contacted HTA for authorization request.                 Expected Discharge Plan: Skilled Nursing Facility Barriers to Discharge: SNF Pending bed offer   Patient Goals and CMS Choice Patient states their goals for this hospitalization and ongoing recovery are:: dementia CMS Medicare.gov Compare Post Acute Care list provided to:: Patient Represenative (must comment) (nephew)    Expected Discharge Plan and Services Expected Discharge Plan: [place]   Discharge Planning Services: CM Consult   Living arrangements for the past 2 months: Single Family Home                                      Prior Living Arrangements/Services Living arrangements for the past 2 months: Single Family Home Lives with:: Self Patient language and need for interpreter reviewed:: Yes        Need for Family Participation in Patient Care: Yes (Comment) Care giver support system in place?: Yes (comment) Current home services: DME Criminal Activity/Legal Involvement Pertinent  to Current Situation/Hospitalization: No - Comment as needed  Activities of Daily Living Home Assistive Devices/Equipment: Cane (specify quad or straight), Eyeglasses (pt unable to list what equipment she uses at home) ADL Screening (condition at time of admission) Patient's cognitive ability adequate to safely complete daily activities?: No Is the patient deaf or have difficulty hearing?: Yes Does the patient have difficulty seeing, even when wearing glasses/contacts?: No (hx bilateral catarct surgery) Does the patient have difficulty concentrating, remembering, or making decisions?: Yes Patient able to express need for assistance with ADLs?: Yes Does the patient have difficulty dressing or bathing?: No Independently performs ADLs?: Yes (appropriate for developmental age) Does the patient have difficulty walking or climbing stairs?: Yes Weakness of Legs: None Weakness of Arms/Hands: None  Permission Sought/Granted   Permission granted to share information with : Yes, Verbal Permission Granted  Share Information with NAME: [name], [name]     [phone]           Emotional Assessment Appearance:: Appears stated age Attitude/Demeanor/Rapport: Engaged Affect (typically observed): Unable to Assess Orientation: : Oriented to Self Alcohol / Substance Use: Not Applicable Psych Involvement: No (comment)  Admission diagnosis:  Symptomatic anemia [D64.9] Patient Active Problem List   Diagnosis Date Noted   Symptomatic anemia 05/31/2021   Dementia without behavioral disturbance ([place]) 05/31/2021   Fall at  home, initial encounter 05/31/2021   CKD (chronic kidney disease) stage 4, GFR 15-29 ml/min (HCC) 05/31/2021   Genetic testing 09/05/2017   Family history of colon cancer    Family history of melanoma    Family history of leukemia    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    Bradycardia 10/18/2014   AKI (acute kidney injury) ([place]) 10/17/2014    Essential hypertension 10/17/2014   Syncope and collapse 10/16/2014   Endometrial cancer determined by uterine biopsy ([place]) 01/02/2014   Constipation, chronic 06/13/2011   H/O diverticulitis of colon 06/13/2011   Gastritis 06/05/2011   Family history of malignant neoplasm of gastrointestinal tract 05/25/2011   Esophageal reflux 05/25/2011   Diverticulosis of colon (without mention of hemorrhage) 04/26/2011   Abdominal pain 04/26/2011   Constipation, outlet dysfunction 04/26/2011   PCP:  Burnard Bunting, MD Pharmacy:   [id number] [place], [id number]: [phone]: [phone]    Social Determinants of Health (SDOH) Interventions    Readmission Risk Interventions No flowsheet data found.

## 2021-06-02 NOTE — Progress Notes (Signed)
°   06/01/21 2320  Assess: MEWS Score  Temp 98.9 F (37.2 C)  BP (!) 98/51  Pulse Rate (!) 48  Resp 17  SpO2 97 %  Assess: MEWS Score  MEWS Temp 0  MEWS Systolic 1  MEWS Pulse 1  MEWS RR 0  MEWS LOC 0  MEWS Score 2  MEWS Score Color Yellow  Assess: if the MEWS score is Yellow or Red  Were vital signs taken at a resting state? Yes  Focused Assessment Change from prior assessment (see assessment flowsheet)  Does the patient meet 2 or more of the SIRS criteria? No  MEWS guidelines implemented *See Row Information* Yes  Treat  MEWS Interventions Other (Comment) (MD notified)  Pain Scale 0-10  Pain Score 0  Breathing 0  Negative Vocalization 0  Facial Expression 0  Body Language 0  Consolability 0  PAINAD Score 0  Take Vital Signs  Increase Vital Sign Frequency  Yellow: Q 2hr X 2 then Q 4hr X 2, if remains yellow, continue Q 4hrs  Escalate  MEWS: Escalate Yellow: discuss with charge nurse/RN and consider discussing with provider and RRT  Notify: Charge Nurse/RN  Name of Charge Nurse/RN Notified TThurmond Butts  Date Charge Nurse/RN Notified 06/02/21  Time Charge Nurse/RN Notified 0119  Notify: Provider  Provider Name/Title J. Olena Heckle  Date Provider Notified 06/01/21  Time Provider Notified 2355  Notification Type Page  Provider response No new orders  Date of Provider Response 06/01/21  Time of Provider Response 2355  Document  Patient Outcome Stabilized after interventions  Progress note created (see row info) Yes  Assess: SIRS CRITERIA  SIRS Temperature  0  SIRS Pulse 0  SIRS Respirations  0  SIRS WBC 0  SIRS Score Sum  0

## 2021-06-02 NOTE — NC FL2 (Signed)
[place] LEVEL OF CARE SCREENING TOOL     IDENTIFICATION  Patient Name: [name] Birthdate: [birth year] Sex: female Admission Date (Current Location): 05/31/2021  [facility] and [place] Number:  Herbalist and Address:  [facility] [place],  [place], [place]      Provider Number: [id number]  Attending Physician Name and Address:  Lavina Hamman, MD  Relative Name and Phone Number:  Sevyn, [place]     [phone]    Current Level of Care: Hospital Recommended Level of Care: [place] Prior Approval Number:    Date Approved/Denied:   PASRR Number: [id number]  Discharge Plan: SNF    Current Diagnoses: Patient Active Problem List   Diagnosis Date Noted   Symptomatic anemia 05/31/2021   Dementia without behavioral disturbance ([place]) 05/31/2021   Fall at home, initial encounter 05/31/2021   CKD (chronic kidney disease) stage 4, GFR 15-29 ml/min ([place]) 05/31/2021   Genetic testing 09/05/2017   Family history of colon cancer    Family history of melanoma    Family history of leukemia    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    Bradycardia 10/18/2014   AKI (acute kidney injury) ([place]) 10/17/2014   Essential hypertension 10/17/2014   Syncope and collapse 10/16/2014   Endometrial cancer determined by uterine biopsy ([place]) 01/02/2014   Constipation, chronic 06/13/2011   H/O diverticulitis of colon 06/13/2011   Gastritis 06/05/2011   Family history of malignant neoplasm of gastrointestinal tract 05/25/2011   Esophageal reflux 05/25/2011   Diverticulosis of colon (without mention of hemorrhage) 04/26/2011   Abdominal pain 04/26/2011   Constipation, outlet dysfunction 04/26/2011    Orientation RESPIRATION BLADDER Height & Weight     Self  Normal External catheter Weight: 46 kg Height:  '5\' 2"'  (157.5 cm)  BEHAVIORAL SYMPTOMS/MOOD NEUROLOGICAL BOWEL NUTRITION STATUS       Continent Diet (see d/c summary)  AMBULATORY STATUS COMMUNICATION OF NEEDS Skin   Extensive Assist Verbally Normal                       Personal Care Assistance Level of Assistance  Bathing, Feeding, Dressing Bathing Assistance: Maximum assistance Feeding assistance: Independent Dressing Assistance: Limited assistance     Functional Limitations Info  Sight, Hearing, Speech Sight Info: Adequate Hearing Info: Impaired Speech Info: Adequate    SPECIAL CARE FACTORS FREQUENCY  PT (By licensed PT), OT (By licensed OT)     PT Frequency: 5X/W OT Frequency: 5X/W            Contractures Contractures Info: Not present    Additional Factors Info  Code Status Code Status Info: full             Current Medications (06/02/2021):  This is the current hospital active medication list Current Facility-Administered Medications  Medication Dose Route Frequency Provider Last Rate Last Admin   acetaminophen (TYLENOL) tablet 650 mg  650 mg Oral Q6H PRN Marylyn Ishihara, Tyrone A, DO   650 mg at 06/01/21 2159   Or   acetaminophen (TYLENOL) suppository 650 mg  650 mg Rectal Q6H PRN Marylyn Ishihara, Tyrone A, DO       diclofenac Sodium (VOLTAREN) 1 % topical gel 2 g  2 g Topical BID Lavina Hamman, MD       donepezil (ARICEPT) tablet 10 mg  10 mg Oral QHS Kyle, Tyrone A, DO   10 mg at 06/01/21 2159   ferrous  sulfate tablet 325 mg  325 mg Oral TID WC Lavina Hamman, MD   325 mg at 29/92/42 6834   folic acid (FOLVITE) tablet 1 mg  1 mg Oral Daily Kyle, Tyrone A, DO   1 mg at 06/02/21 1962   multivitamin with minerals tablet 1 tablet  1 tablet Oral Daily Kyle, Tyrone A, DO   1 tablet at 06/02/21 0923   pantoprazole (PROTONIX) injection 40 mg  40 mg Intravenous Q12H Lavina Hamman, MD   40 mg at 06/02/21 2297     Discharge Medications: Please see discharge summary for a list of discharge medications.  Relevant Imaging Results:  Relevant Lab Results:   Additional Information [place], [place]

## 2021-06-03 DIAGNOSIS — D649 Anemia, unspecified: Secondary | ICD-10-CM | POA: Diagnosis present

## 2021-06-03 DIAGNOSIS — I2510 Atherosclerotic heart disease of native coronary artery without angina pectoris: Secondary | ICD-10-CM | POA: Diagnosis present

## 2021-06-03 DIAGNOSIS — Z833 Family history of diabetes mellitus: Secondary | ICD-10-CM | POA: Diagnosis not present

## 2021-06-03 DIAGNOSIS — Z808 Family history of malignant neoplasm of other organs or systems: Secondary | ICD-10-CM | POA: Diagnosis not present

## 2021-06-03 DIAGNOSIS — N179 Acute kidney failure, unspecified: Secondary | ICD-10-CM | POA: Diagnosis present

## 2021-06-03 DIAGNOSIS — W19XXXA Unspecified fall, initial encounter: Secondary | ICD-10-CM | POA: Diagnosis present

## 2021-06-03 DIAGNOSIS — R001 Bradycardia, unspecified: Secondary | ICD-10-CM | POA: Diagnosis present

## 2021-06-03 DIAGNOSIS — N184 Chronic kidney disease, stage 4 (severe): Secondary | ICD-10-CM | POA: Diagnosis present

## 2021-06-03 DIAGNOSIS — I459 Conduction disorder, unspecified: Secondary | ICD-10-CM | POA: Diagnosis present

## 2021-06-03 DIAGNOSIS — K5730 Diverticulosis of large intestine without perforation or abscess without bleeding: Secondary | ICD-10-CM | POA: Diagnosis present

## 2021-06-03 DIAGNOSIS — Z602 Problems related to living alone: Secondary | ICD-10-CM | POA: Diagnosis present

## 2021-06-03 DIAGNOSIS — Z20822 Contact with and (suspected) exposure to covid-19: Secondary | ICD-10-CM | POA: Diagnosis present

## 2021-06-03 DIAGNOSIS — F03C0 Unspecified dementia, severe, without behavioral disturbance, psychotic disturbance, mood disturbance, and anxiety: Secondary | ICD-10-CM | POA: Diagnosis present

## 2021-06-03 DIAGNOSIS — Z8249 Family history of ischemic heart disease and other diseases of the circulatory system: Secondary | ICD-10-CM | POA: Diagnosis not present

## 2021-06-03 DIAGNOSIS — Y92009 Unspecified place in unspecified non-institutional (private) residence as the place of occurrence of the external cause: Secondary | ICD-10-CM | POA: Diagnosis not present

## 2021-06-03 DIAGNOSIS — G250 Essential tremor: Secondary | ICD-10-CM | POA: Diagnosis present

## 2021-06-03 DIAGNOSIS — Z681 Body mass index (BMI) 19 or less, adult: Secondary | ICD-10-CM | POA: Diagnosis not present

## 2021-06-03 DIAGNOSIS — Z85828 Personal history of other malignant neoplasm of skin: Secondary | ICD-10-CM | POA: Diagnosis not present

## 2021-06-03 DIAGNOSIS — Z800 Family history of malignant neoplasm of digestive organs: Secondary | ICD-10-CM | POA: Diagnosis not present

## 2021-06-03 DIAGNOSIS — Z8542 Personal history of malignant neoplasm of other parts of uterus: Secondary | ICD-10-CM | POA: Diagnosis not present

## 2021-06-03 DIAGNOSIS — Z801 Family history of malignant neoplasm of trachea, bronchus and lung: Secondary | ICD-10-CM | POA: Diagnosis not present

## 2021-06-03 DIAGNOSIS — I129 Hypertensive chronic kidney disease with stage 1 through stage 4 chronic kidney disease, or unspecified chronic kidney disease: Secondary | ICD-10-CM | POA: Diagnosis present

## 2021-06-03 DIAGNOSIS — R296 Repeated falls: Secondary | ICD-10-CM | POA: Diagnosis present

## 2021-06-03 DIAGNOSIS — Z8673 Personal history of transient ischemic attack (TIA), and cerebral infarction without residual deficits: Secondary | ICD-10-CM | POA: Diagnosis not present

## 2021-06-03 DIAGNOSIS — Z8371 Family history of colonic polyps: Secondary | ICD-10-CM | POA: Diagnosis not present

## 2021-06-03 DIAGNOSIS — Z806 Family history of leukemia: Secondary | ICD-10-CM | POA: Diagnosis not present

## 2021-06-03 DIAGNOSIS — E7800 Pure hypercholesterolemia, unspecified: Secondary | ICD-10-CM | POA: Diagnosis present

## 2021-06-03 LAB — CBC
HCT: 28.6 % — ABNORMAL LOW (ref 36.0–46.0)
Hemoglobin: 9.2 g/dL — ABNORMAL LOW (ref 12.0–15.0)
MCH: 30 pg (ref 26.0–34.0)
MCHC: 32.2 g/dL (ref 30.0–36.0)
MCV: 93.2 fL (ref 80.0–100.0)
Platelets: 153 10*3/uL (ref 150–400)
RBC: 3.07 MIL/uL — ABNORMAL LOW (ref 3.87–5.11)
RDW: 17.8 % — ABNORMAL HIGH (ref 11.5–15.5)
WBC: 6.3 10*3/uL (ref 4.0–10.5)
nRBC: 0 % (ref 0.0–0.2)

## 2021-06-03 LAB — BASIC METABOLIC PANEL
Anion gap: 5 (ref 5–15)
BUN: 42 mg/dL — ABNORMAL HIGH (ref 8–23)
CO2: 22 mmol/L (ref 22–32)
Calcium: 8.5 mg/dL — ABNORMAL LOW (ref 8.9–10.3)
Chloride: 109 mmol/L (ref 98–111)
Creatinine, Ser: 2.1 mg/dL — ABNORMAL HIGH (ref 0.44–1.00)
GFR, Estimated: 23 mL/min — ABNORMAL LOW (ref >60)
Glucose, Bld: 90 mg/dL (ref 70–99)
Potassium: 4.6 mmol/L (ref 3.5–5.1)
Sodium: 136 mmol/L (ref 135–145)

## 2021-06-03 LAB — MAGNESIUM: Magnesium: 2 mg/dL (ref 1.7–2.4)

## 2021-06-03 MED ORDER — SODIUM CHLORIDE 0.9 % IV BOLUS
500.0000 mL | Freq: Once | INTRAVENOUS | Status: AC
  Administered 2021-06-03: 500 mL via INTRAVENOUS

## 2021-06-03 MED ORDER — TRAZODONE HCL 50 MG PO TABS
50.0000 mg | ORAL_TABLET | Freq: Every day | ORAL | Status: DC
  Administered 2021-06-03 – 2021-06-05 (×3): 50 mg via ORAL
  Filled 2021-06-03 (×3): qty 1

## 2021-06-03 MED ORDER — PANTOPRAZOLE SODIUM 40 MG PO TBEC
40.0000 mg | DELAYED_RELEASE_TABLET | Freq: Every day | ORAL | Status: DC
  Administered 2021-06-03 – 2021-06-06 (×4): 40 mg via ORAL
  Filled 2021-06-03 (×4): qty 1

## 2021-06-03 MED ORDER — SODIUM CHLORIDE 0.9 % IV SOLN
INTRAVENOUS | Status: DC
Start: 2021-06-03 — End: 2021-06-04

## 2021-06-03 MED ORDER — ENSURE ENLIVE PO LIQD
237.0000 mL | Freq: Two times a day (BID) | ORAL | Status: DC
  Administered 2021-06-03 – 2021-06-06 (×6): 237 mL via ORAL

## 2021-06-03 MED ORDER — QUETIAPINE FUMARATE 25 MG PO TABS
12.5000 mg | ORAL_TABLET | Freq: Once | ORAL | Status: AC
  Administered 2021-06-04: 12.5 mg via ORAL
  Filled 2021-06-03: qty 1

## 2021-06-03 NOTE — Assessment & Plan Note (Addendum)
Etiology not clear. Possibility of anemia causing the fall possibility of bradycardia causing the fall. While the patient does have bradycardia he does not appear to have any symptoms here in the hospital or any abnormal heart block on telemetry. Echocardiogram shows preserved EF, no acute valvular abnormality. Monitor for now Cardiology consulted for sinus bradycardia no further recommendation for now.  Patient not a candidate for pacemaker secondary to dementia per cardiology.

## 2021-06-03 NOTE — Progress Notes (Signed)
Initial Nutrition Assessment  DOCUMENTATION CODES:   Underweight  INTERVENTION:   -Ensure Plus High Protein po BID, each supplement provides 350 kcal and 20 grams of protein.   NUTRITION DIAGNOSIS:   Inadequate oral intake related to  (severe dementia) as evidenced by per patient/family report.  GOAL:   Patient will meet greater than or equal to 90% of their needs  MONITOR:   PO intake, Supplement acceptance, Labs, Weight trends, I & O's  REASON FOR ASSESSMENT:   Malnutrition Screening Tool    ASSESSMENT:   88 y.o. female with medical history significant of CKD4, demential, anemia, HLD, HTN.  Patient with severe dementia, alert/oriented x 1.  Per family reports, pt eats small amounts at meals.  Pt consumed 30% of a full breakfast this morning (eggs, english muffin, bacon, potatoes, grits, banana).  Will likely benefit from Ensure supplements given poor PO.  Per weight records, last weight recorded PTA was 104 lbs on 10/10/18. Current weight: 101 lbs. Overall, pt's weight has been trending down since 2016.  Medications: Ferrous sulfate, Folic acid, Multivitamin with minerals daily  Labs reviewed.  NUTRITION - FOCUSED PHYSICAL EXAM:  Unable to complete, working remotely.  Diet Order:   Diet Order             Diet regular Room service appropriate? Yes; Fluid consistency: Thin  Diet effective now                   EDUCATION NEEDS:   Not appropriate for education at this time  Skin:  Skin Assessment: Reviewed RN Assessment  Last BM:  2/22 -type 1  Height:   Ht Readings from Last 1 Encounters:  05/31/21 5\' 2"  (1.575 m)    Weight:   Wt Readings from Last 1 Encounters:  05/31/21 46 kg    BMI:  Body mass index is 18.55 kg/m.  Estimated Nutritional Needs:   Kcal:  1300-1500  Protein:  65-75g  Fluid:  1.5L/day  Clayton Bibles, MS, RD, LDN Inpatient Clinical Dietitian Contact information available via Amion

## 2021-06-03 NOTE — Consult Note (Addendum)
Cardiology Consultation:   Patient ID: SHAIANNE NUCCI MRN: [id number]; DOB: 12-20-1932  Admit date: 05/31/2021 Date of Consult: 06/03/2021  PCP:  Burnard Bunting, MD   [facility] Providers Cardiologist:  New to [facility]    Patient Profile:   [name] is a 88 y.o. female with a hx of CKD stage IV, severe dementia, anemia, hypertension and hyperlipidemia who is being seen 06/03/2021 for the evaluation of bradycardia at the request of Dr. Posey Pronto.  History of Present Illness:   [name] is a pleasant 88 year old female with past medical history of CKD stage IV, severe dementia, anemia, hypertension and hyperlipidemia.  Patient is oriented to self and family only, she does not remember the year, location or who is the president.  She is hard of hearing however main problem is still dementia.  She can read very simple sentences to communicate, however quickly forget the conversation altogether after a few minutes.  She lives by herself and her brother checks on her every day.  She presented to [facility] after being found down on the floor by her brother was bringing her breakfast.  He is not sure how long she was down.  She reportedly had vomited on the floor as well.  Her brother helped her up and called EMS.  It is unclear if she truly passed out as the patient is a very poor historian and cannot remember what has happened.  Her PCP recently diagnosed her with anemia and has sent her home with "vitamins".  Her brother did not pick them up as he did not think she needed them.  The only medication she takes at home is donepezil.   On arrival to [facility], she was noted to be severely anemic with hemoglobin 6.8.  Platelet 149.  Potassium 5.4, creatinine 1.8.  Albumin 2.6.  She had borderline low iron and TIBC.  Viral panel was negative.  Urinalysis showed large leukocyte but otherwise clear urine, no hemoglobin, nitrite or bacteria.  She received blood transfusion.   Echocardiogram obtained on 06/01/2021 showed EF 60 to 65%, no regional wall motion abnormality, mild LAE, mild MR, mild AI.  During hospitalization, it was also noted patient had recurrent bradycardia.  Despite so, she denies any dizzy spell or feeling of passing out.  Heart rate on telemetry has been around 45 to low 50s bpm.  Cardiology service consulted for bradycardia.   Past Medical History:  Diagnosis Date   Arthritis    Baker cyst    a. 04/2014 on right.   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    History of gallstones    History of kidney stones    Hypercholesterolemia    Obesity    TIA (transient ischemic attack)    Varicose veins     Past Surgical History:  Procedure Laterality Date   CATARACT EXTRACTION Bilateral 2005   CATARACT EXTRACTION W/ INTRAOCULAR LENS  IMPLANT, BILATERAL     CHOLECYSTECTOMY     CHOLECYSTECTOMY, LAPAROSCOPIC  2002   COLONOSCOPY     EYE  SURGERY     HYSTEROSCOPY WITH D & C N/A 12/18/2013   Procedure: DILATATION AND CURETTAGE /HYSTEROSCOPY;  Surgeon: Gus Height, MD;  Location: [place] [facility];  Service: Gynecology;  Laterality: N/A;   LUMBAR DISC SURGERY Right 1998   L1-L2   LYMPH NODE DISSECTION N/A 02/17/2014   Procedure: LYMPH NODE DISSECTION;  Surgeon: Imagene Gurney A. Alycia Rossetti, MD;  Location: [facility];  Service: Gynecology;  Laterality: N/A;   ROBOTIC ASSISTED TOTAL HYSTERECTOMY WITH BILATERAL SALPINGO OOPHERECTOMY N/A 02/17/2014   with Lymph node dissection ; Surgeon: Imagene Gurney A. Alycia Rossetti, MD;  Location: [facility];  Service: Gynecology;  Laterality: N/A;   ROTATOR CUFF REPAIR Bilateral 2003   SKIN BIOPSY      Four times last 2005 - nose cancer   TENDON GRAFT  2003   Bone graft   TONSILLECTOMY AND ADENOIDECTOMY  1958   WISDOM TOOTH EXTRACTION       Home Medications:  Prior to Admission medications   Medication Sig Start Date End Date Taking? Authorizing Provider  donepezil (ARICEPT) 10 MG tablet Take 10 mg by mouth at bedtime. 01/29/21  Yes [provider]  naproxen sodium (ALEVE) 220 MG tablet Take 220 mg by mouth daily as needed (pain).   Yes [provider]  esomeprazole (NEXIUM) 40 MG capsule Take 1 capsule (40 mg total) by mouth daily. 06/13/11 06/23/11  Sable Feil, MD  Linaclotide Rolan Lipa) 145 MCG CAPS Take 1 capsule by mouth daily. 06/13/11 06/23/11  Sable Feil, MD    Inpatient Medications: Scheduled Meds:  diclofenac Sodium  2 g Topical BID   donepezil  10 mg Oral QHS   ferrous sulfate  325 mg Oral TID WC   folic acid  1 mg Oral Daily   multivitamin with minerals  1 tablet Oral Daily   pantoprazole  40 mg Oral Daily   Continuous Infusions:  sodium chloride 100 mL/hr at 06/03/21 0907   PRN Meds: acetaminophen **OR** acetaminophen  Allergies:    Allergies  Allergen Reactions   Benadryl [Diphenhydramine Hcl (Sleep)] Other (See Comments)    Light headed, heart racing.    Ciprofloxacin Other (See Comments)    Leg pains   Pravastatin Other (See Comments)    Leg cramps   Vioxx [Rofecoxib] Nausea Only   Cephalosporins Rash    Social History:   Social History   Socioeconomic History   Marital status: Single    Spouse name: Not on file   Number of children: 0   Years of education: Not on file   Highest education level: Not on file  Occupational History   Occupation: Retired  Tobacco Use   Smoking status: Never   Smokeless tobacco: Never  Vaping Use   Vaping Use: Never used  Substance and Sexual Activity   Alcohol use: No   Drug use: No   Sexual activity: Never    Birth control/protection: Post-menopausal  Other Topics Concern    Not on file  Social History Narrative   Not on file   Social Determinants of Health   Financial Resource Strain: Not on file  Food Insecurity: Not on file  Transportation Needs: Not on file  Physical Activity: Not on file  Stress: Not on file  Social Connections: Not on file  Intimate Partner Violence: Not on file    Family History:    Family History  Problem Relation Age of Onset   Heart disease Sister    Diabetes Brother  x 4   Colon cancer Sister        dx in her 39s   Diabetes Sister    Crohn's disease Other        Nephew   Colon cancer Maternal Aunt    Colon cancer Maternal Uncle    Heart disease Brother    Colon polyps Other        Nephew   Leukemia Sister    Diabetes Sister    Melanoma Other 78       niece   Lung cancer Other    Leukemia Other        great niece     ROS:  Please see the history of present illness.   All other ROS reviewed and negative.     Physical Exam/Data:   Vitals:   06/02/21 2054 06/03/21 0504 06/03/21 0807 06/03/21 0809  BP: 105/62 (!) 126/58 123/66 128/89  Pulse: (!) 46 (!) 50 (!) 45 (!) 48  Resp: '17 16 18   ' Temp: 98.5 F (36.9 C) 98.7 F (37.1 C) 99 F (37.2 C)   TempSrc:  Oral Oral   SpO2: 97% 99% 100% 100%  Weight:      Height:        Intake/Output Summary (Last 24 hours) at 06/03/2021 1045 Last data filed at 06/03/2021 0845 Gross per 24 hour  Intake 120 ml  Output 150 ml  Net -30 ml   Last 3 Weights 05/31/2021 10/10/2018 02/01/2018  Weight (lbs) 101 lb 6.6 oz 105 lb 122 lb  Weight (kg) 46 kg 47.628 kg 55.339 kg     Body mass index is 18.55 kg/m.    Neck: no JVD Vascular: No carotid bruits; Distal pulses 2+ bilaterally  Lungs:  clear to auscultation bilaterally, no wheezing, rhonchi or rales  Abd: soft, nontender, no hepatomegaly  Ext: no edema Musculoskeletal:  No deformities, BUE and BLE strength normal and equal Skin: warm and dry  Neuro:  CNs 2-12 intact, no focal abnormalities noted Psych:   Normal affect   EKG:  The EKG was personally reviewed and demonstrates:  sinus bradycardia with PACs and single PVC Telemetry:  Telemetry was personally reviewed and demonstrates:  sinus bradycardia with HR 45-50s  Relevant CV Studies:  Echo 06/01/2021  1. Left ventricular ejection fraction, by estimation, is 60 to 65%. The  left ventricle has normal function. The left ventricle has no regional  wall motion abnormalities. Left ventricular diastolic parameters are  indeterminate.   2. Right ventricular systolic function is normal. The right ventricular  size is normal. There is normal pulmonary artery systolic pressure.   3. Left atrial size was mildly dilated.   4. The mitral valve is normal in structure. Mild mitral valve  regurgitation. No evidence of mitral stenosis.   5. The aortic valve is tricuspid. There is mild calcification of the  aortic valve. Aortic valve regurgitation is mild. Aortic valve sclerosis  is present, with no evidence of aortic valve stenosis.   6. The inferior vena cava is normal in size with greater than 50%  respiratory variability, suggesting right atrial pressure of 3 mmHg.   Laboratory Data:  High Sensitivity Troponin:  No results for input(s): TROPONINIHS in the last 720 hours.   Chemistry Recent Labs  Lab 06/01/21 0517 06/02/21 0516 06/03/21 0530  NA 138 133* 136  K 5.4* 4.4 4.6  CL 115* 111 109  CO2 21* 19* 22  GLUCOSE 84 77 90  BUN 37* 39* 42*  CREATININE 1.80* 1.83* 2.10*  CALCIUM 8.2* 8.4* 8.5*  MG  --  1.9 2.0  GFRNONAA 27* 27* 23*  ANIONGAP 2* 3* 5    Recent Labs  Lab 06/01/21 0517  PROT 5.3*  ALBUMIN 2.6*  AST 20  ALT 11  ALKPHOS 37*  BILITOT 0.7   Lipids No results for input(s): CHOL, TRIG, HDL, LABVLDL, LDLCALC, CHOLHDL in the last 168 hours.  Hematology Recent Labs  Lab 06/01/21 0517 06/01/21 1744 06/02/21 0516 06/03/21 0530  WBC 5.3  --  5.2 6.3  RBC 2.25*  --  2.96* 3.07*  HGB 6.8* 9.3* 8.9* 9.2*  HCT 20.4*  28.0* 27.5* 28.6*  MCV 90.7  --  92.9 93.2  MCH 30.2  --  30.1 30.0  MCHC 33.3  --  32.4 32.2  RDW 18.7*  --  18.1* 17.8*  PLT 149*  --  144* 153   Thyroid No results for input(s): TSH, FREET4 in the last 168 hours.  BNPNo results for input(s): BNP, PROBNP in the last 168 hours.  DDimer No results for input(s): DDIMER in the last 168 hours.   Radiology/Studies:  CT Head Wo Contrast  Result Date: 05/31/2021 CLINICAL DATA:  Head trauma EXAM: CT HEAD WITHOUT CONTRAST CT MAXILLOFACIAL WITHOUT CONTRAST TECHNIQUE: Multidetector CT imaging of the head and maxillofacial structures were performed using the standard protocol without intravenous contrast. Multiplanar CT image reconstructions of the maxillofacial structures were also generated. RADIATION DOSE REDUCTION: This exam was performed according to the departmental dose-optimization program which includes automated exposure control, adjustment of the mA and/or kV according to patient size and/or use of iterative reconstruction technique. COMPARISON:  Brain MRI 03/22/2018, CT head and maxillofacial CT 10/10/2018 FINDINGS: CT HEAD FINDINGS Brain: There is no evidence of acute intracranial hemorrhage, extra-axial fluid collection, or acute infarct. Background parenchymal volume is normal for age. The ventricles are stable in size. There is a mild burden of white matter microangiopathic change. There is no mass lesion. There is no midline shift. Vascular: There is calcification of the bilateral cavernous ICAs. Skull: Normal. Negative for fracture or focal lesion. Other: None. CT MAXILLOFACIAL FINDINGS Osseous: There is no acute facial bone fracture. There is no evidence of mandibular dislocation. There is no suspicious osseous lesion. There is advanced degenerative change of the left temporomandibular joint. Orbits: Bilateral lens implants are in place. The globes and orbits are otherwise unremarkable. Sinuses: There is soap bubble appearing fluid in the left  sphenoid sinus. The other paranasal sinuses are clear. Soft tissues: Unremarkable. IMPRESSION: 1. No acute intracranial hemorrhage or calvarial fracture. 2. No acute facial bone fracture. Electronically Signed   By: Valetta Mole M.D.   On: 05/31/2021 12:24   CT Cervical Spine Wo Contrast  Result Date: 05/31/2021 CLINICAL DATA:  Trauma EXAM: CT CERVICAL SPINE WITHOUT CONTRAST TECHNIQUE: Multidetector CT imaging of the cervical spine was performed without intravenous contrast. Multiplanar CT image reconstructions were also generated. RADIATION DOSE REDUCTION: This exam was performed according to the departmental dose-optimization program which includes automated exposure control, adjustment of the mA and/or kV according to patient size and/or use of iterative reconstruction technique. COMPARISON:  Cervical spine CT 10/10/2018 FINDINGS: Alignment: There is grade 1 retrolisthesis C3 on C4 and C4 on C5, and grade 1 anterolisthesis C5 on C6 and C6 on C7, unchanged and likely degenerative in nature. There is no jumped or perched facets or other evidence of traumatic malalignment. Skull base and vertebrae: Skull base alignment is maintained. Vertebral body heights  are preserved. There is no evidence of acute fracture. Soft tissues and spinal canal: No prevertebral fluid or swelling. No visible canal hematoma. Disc levels: There is bulky degenerative pannus about the dens without evidence of mass effect on the underlying cord. There is advanced multilevel intervertebral disc space narrowing and degenerative endplate change, most advanced at C3-C4 and C4-C5. There is advanced multilevel facet arthropathy, most advanced on the left at C2-C3 and C5-C6. There is probable moderate spinal canal stenosis at C4-C5 and up to severe neural foraminal stenosis on the right at C3-C4 and on the left at C4-C5. Findings are overall not significantly changed. Upper chest: The imaged lung apices are clear. Other: There is a hypodense  right thyroid nodule measuring up to 1.2 cm, not significantly changed since 10/10/2018. IMPRESSION: 1. No acute fracture or traumatic malalignment of the cervical spine. 2. Advanced multilevel degenerative changes and degenerative spondylolisthesis as detailed above. Electronically Signed   By: Valetta Mole M.D.   On: 05/31/2021 12:30   DG Knee Complete 4 Views Right  Result Date: 05/31/2021 CLINICAL DATA:  Fall. Found on floor. Right knee pain. EXAM: RIGHT KNEE - COMPLETE 4+ VIEW COMPARISON:  Right knee radiographs 07/25/2017. FINDINGS: The right knee is located. Progressive Vance tricompartmental degenerative changes are present. No significant effusion is present. Vascular calcifications are noted. IMPRESSION: 1. Progressive tricompartmental degenerative changes of the right knee. 2. No acute abnormality. 3. Atherosclerosis. Electronically Signed   By: San Morelle M.D.   On: 05/31/2021 11:53   ECHOCARDIOGRAM COMPLETE  Result Date: 06/01/2021    ECHOCARDIOGRAM REPORT   Patient Name:   [name] Certain Date of Exam: 06/01/2021 Medical Rec #:  [id number]     Height:       62.0 in Accession #:    [id number]    Weight:       101.4 lb Date of Birth:  April 23, 1932     BSA:          1.432 m Patient Age:    77 years      BP:           103/50 mmHg Patient Gender: F             HR:           52 bpm. Exam Location:  Inpatient Procedure: 2D Echo Indications:    Syncope  History:        Patient has no prior history of Echocardiogram examinations.                 Risk Factors:Hypertension.  Sonographer:    Beryle Beams Referring Phys: [id number] Carter  1. Left ventricular ejection fraction, by estimation, is 60 to 65%. The left ventricle has normal function. The left ventricle has no regional wall motion abnormalities. Left ventricular diastolic parameters are indeterminate.  2. Right ventricular systolic function is normal. The right ventricular size is normal. There is normal pulmonary artery  systolic pressure.  3. Left atrial size was mildly dilated.  4. The mitral valve is normal in structure. Mild mitral valve regurgitation. No evidence of mitral stenosis.  5. The aortic valve is tricuspid. There is mild calcification of the aortic valve. Aortic valve regurgitation is mild. Aortic valve sclerosis is present, with no evidence of aortic valve stenosis.  6. The inferior vena cava is normal in size with greater than 50% respiratory variability, suggesting right atrial pressure of 3 mmHg. FINDINGS  Left Ventricle: Left ventricular ejection fraction,  by estimation, is 60 to 65%. The left ventricle has normal function. The left ventricle has no regional wall motion abnormalities. The left ventricular internal cavity size was normal in size. There is  no left ventricular hypertrophy. Left ventricular diastolic parameters are indeterminate. Right Ventricle: The right ventricular size is normal. No increase in right ventricular wall thickness. Right ventricular systolic function is normal. There is normal pulmonary artery systolic pressure. The tricuspid regurgitant velocity is 2.21 m/s, and  with an assumed right atrial pressure of 10 mmHg, the estimated right ventricular systolic pressure is 98.9 mmHg. Left Atrium: Left atrial size was mildly dilated. Right Atrium: Right atrial size was normal in size. Pericardium: There is no evidence of pericardial effusion. Mitral Valve: The mitral valve is normal in structure. Mild mitral valve regurgitation. No evidence of mitral valve stenosis. Tricuspid Valve: The tricuspid valve is normal in structure. Tricuspid valve regurgitation is mild . No evidence of tricuspid stenosis. Aortic Valve: The aortic valve is tricuspid. There is mild calcification of the aortic valve. Aortic valve regurgitation is mild. Aortic regurgitation PHT measures 897 msec. Aortic valve sclerosis is present, with no evidence of aortic valve stenosis. Aortic valve peak gradient measures 6.8 mmHg.  Pulmonic Valve: The pulmonic valve was normal in structure. Pulmonic valve regurgitation is trivial. No evidence of pulmonic stenosis. Aorta: The aortic root is normal in size and structure. Venous: The inferior vena cava is normal in size with greater than 50% respiratory variability, suggesting right atrial pressure of 3 mmHg. IAS/Shunts: No atrial level shunt detected by color flow Doppler.  LEFT VENTRICLE PLAX 2D LVIDd:         4.40 cm   Diastology LVIDs:         3.00 cm   LV e' medial:    7.70 cm/s LV PW:         0.90 cm   LV E/e' medial:  10.3 LV IVS:        1.00 cm   LV e' lateral:   9.16 cm/s LVOT diam:     2.00 cm   LV E/e' lateral: 8.7 LV SV:         90 LV SV Index:   63 LVOT Area:     3.14 cm  RIGHT VENTRICLE         IVC TAPSE (M-mode): 1.8 cm  IVC diam: 1.80 cm LEFT ATRIUM             Index        RIGHT ATRIUM           Index LA diam:        3.30 cm 2.30 cm/m   RA Area:     12.10 cm LA Vol (A2C):   45.9 ml 32.04 ml/m  RA Volume:   27.40 ml  19.13 ml/m LA Vol (A4C):   52.3 ml 36.51 ml/m LA Biplane Vol: 51.8 ml 36.16 ml/m  AORTIC VALVE                 PULMONIC VALVE AV Area (Vmax): 3.09 cm     PV Vmax:       0.73 m/s AV Vmax:        130.00 cm/s  PV Peak grad:  2.1 mmHg AV Peak Grad:   6.8 mmHg LVOT Vmax:      128.00 cm/s LVOT Vmean:     71.200 cm/s LVOT VTI:       0.287 m AI PHT:  897 msec  AORTA Ao Root diam: 3.20 cm Ao Asc diam:  3.40 cm MITRAL VALVE               TRICUSPID VALVE MV Area (PHT): 3.68 cm    TR Peak grad:   19.5 mmHg MV Decel Time: 206 msec    TR Vmax:        221.00 cm/s MV E velocity: 79.30 cm/s MV A velocity: 64.30 cm/s  SHUNTS MV E/A ratio:  1.23        Systemic VTI:  0.29 m                            Systemic Diam: 2.00 cm Candee Furbish MD Electronically signed by Candee Furbish MD Signature Date/Time: 06/01/2021/11:29:26 AM    Final    DG Hip Unilat W or Wo Pelvis 2-3 Views Right  Result Date: 05/31/2021 CLINICAL DATA:  Fall. Found on floor. Right hip pain. EXAM: DG HIP  (WITH OR WITHOUT PELVIS) 2-3V RIGHT COMPARISON:  None. FINDINGS: The right hip is located. Moderate asymmetric degenerative changes are present. No acute fracture is present. Visualized pelvis demonstrates no acute fracture. Degenerative changes are present in the lower lumbar spine. IMPRESSION: 1. No acute abnormality. 2. Moderate asymmetric degenerative changes of the right hip. Electronically Signed   By: San Morelle M.D.   On: 05/31/2021 11:52   CT Maxillofacial Wo Contrast  Result Date: 05/31/2021 CLINICAL DATA:  Head trauma EXAM: CT HEAD WITHOUT CONTRAST CT MAXILLOFACIAL WITHOUT CONTRAST TECHNIQUE: Multidetector CT imaging of the head and maxillofacial structures were performed using the standard protocol without intravenous contrast. Multiplanar CT image reconstructions of the maxillofacial structures were also generated. RADIATION DOSE REDUCTION: This exam was performed according to the departmental dose-optimization program which includes automated exposure control, adjustment of the mA and/or kV according to patient size and/or use of iterative reconstruction technique. COMPARISON:  Brain MRI 03/22/2018, CT head and maxillofacial CT 10/10/2018 FINDINGS: CT HEAD FINDINGS Brain: There is no evidence of acute intracranial hemorrhage, extra-axial fluid collection, or acute infarct. Background parenchymal volume is normal for age. The ventricles are stable in size. There is a mild burden of white matter microangiopathic change. There is no mass lesion. There is no midline shift. Vascular: There is calcification of the bilateral cavernous ICAs. Skull: Normal. Negative for fracture or focal lesion. Other: None. CT MAXILLOFACIAL FINDINGS Osseous: There is no acute facial bone fracture. There is no evidence of mandibular dislocation. There is no suspicious osseous lesion. There is advanced degenerative change of the left temporomandibular joint. Orbits: Bilateral lens implants are in place. The globes  and orbits are otherwise unremarkable. Sinuses: There is soap bubble appearing fluid in the left sphenoid sinus. The other paranasal sinuses are clear. Soft tissues: Unremarkable. IMPRESSION: 1. No acute intracranial hemorrhage or calvarial fracture. 2. No acute facial bone fracture. Electronically Signed   By: Valetta Mole M.D.   On: 05/31/2021 12:24     Assessment and Plan:   Bradycardia  -Largely asymptomatic without any active dizziness.  She denies any previous near fainting spell or syncope, however her severe dementia prevent her from remembering anything that has happened in the past few minutes.  I do not think she is capable of remembering if she had syncope or not.   -Aricept can potentially lead to bradycardia, will review with MD.  -Either way, I do not think the patient is a pacemaker candidate given her  severe dementia and the lack of obvious symptom.  Fall: Unknown downtime.  Found by her brother was bringing her breakfast.  She reportedly vomited on the floor.  Patient does not remember the entire event.  She also does not remember seeing her brother recently either.  Anemia: no evidence of active bleeding. Treated with 1 unit of PRBC in ED. Hgb stable since.   Severe dementia: On Aricept.  She is oriented to self and family member only.  Although she is hard of hearing, however she is able to read one-line sentences and able to communicate okay.  However she quickly forget about the conversation and her answers began to wander to other nonrelated areas.   Coronary artery calcification: Seen on CT image in September 2015.  Asymptomatic.  Hypertension: Despite carrying diagnosis of high blood pressure, she is not on blood pressure medication at home nor do I think she need any blood pressure medication.  Her blood pressure has been normal in the hospital.  Hyperlipidemia: Not on medication  CKD stage IV: Cr bumped up from 1.8 to 2.1 this morning. Per primary team.    Risk  Assessment/Risk Scores:      For questions or updates, please contact Caroline Please consult [email or web address] for contact info under    Signed, Almyra Deforest, [place]  06/03/2021 10:45 AM   Personally seen and examined. Agree with APP above with the following comments: Briefly 88 yo F with a history of profound dementia, HTN and CKD stage IV and who presents after being found down.  Per Nephew: she is from a family on 9 siblings, most of whom have died.  He pays her brother to have lunch and dinner with her but he takes most of her care.  She lives by herself; he or the youngest brother does most of her ADLs.  Nephew ntoes 4-5 years of dementia that is getting worse.  Patient is severely hard of hearing.  Limited even with writing on a large note pad.  She can read lips at a close distance.  She has no chest pain.  Doesn't feel dizzy.  No palpitations.  No syncope near syncope.  She doesn't take most of her medications.  She feels fine.  She is worried we are going to send her to jail.    Exam notable for elderly frail female in no acute distress.  Regular  bradycardia, subtle diastolic murmur otherwise as above Labs notable for K WNL, Creatinine 1.8-> 2.1 Personally reviewed relevant tests; tele has shown sinus bradycardia, with 1st heart block and Mobtiz Type I heart block Would recommend  - patient is asymptomatic from her bradycardia on no AV nodal agents and no reversible causes save, potentially, from her dementia medication - given her aricept may be helping her, it is reasonable to keep this medication thought she has not been taking medication as an outpatient - she is not a candidate for a pacemaker nor is it within her families wishes. - she does not need cardiology follow up - in discussed with family; her [place] appears to be focus on comfort PC eval may be reasonable. - will sign off with no plans for Cards f/u unless her mentation greatly improves or families wishes  change   Rudean Haskell, MD [place]  [address] [place], [place] [address] [phone]  12:41 PM

## 2021-06-03 NOTE — Plan of Care (Signed)
  Problem: Pain Managment: Goal: General experience of comfort will improve Outcome: Progressing   Problem: Safety: Goal: Ability to remain free from injury will improve Outcome: Progressing   Problem: Skin Integrity: Goal: Risk for impaired skin integrity will decrease Outcome: Progressing   

## 2021-06-03 NOTE — Assessment & Plan Note (Addendum)
Lives alone. No focal deficit. No behavioral issues no agitation. PT recommends SNF.

## 2021-06-03 NOTE — Progress Notes (Signed)
°  Progress Note   Patient: [name] XOV:[id number] DOB: 21-Aug-1932 DOA: 05/31/2021     Hospitalization day: 0 DOS: the patient was seen and examined on 06/03/2021   Brief hospital course:  [name] is a 88 y.o. female with medical history significant of CKD4, demential, anemia, HLD, HTN. History from brother by phone. Patient lives alone but her brother looks in on her.  No significant bleeding in the hospital.  Hemoglobin remaining stable after transfusion. Etiology of the fall is still not clear. Sinus bradycardia seen on telemetry.  Cardiology was consulted. Developed acute kidney injury on 2/24 requiring IV fluid.  Assessment and Plan: * Symptomatic anemia- (present on admission) No evidence of acute bleeding. Likely cause of patient's possible fall. Continue transfusion for hemoglobin less than 7. Monitor FOBT although negative so far. On PPI and regular diet  Unwitnessed fall- (present on admission) Etiology not clear. Possibility of anemia causing the fall possibility of bradycardia causing the fall. While the patient does have bradycardia he does not appear to have any symptoms here in the hospital or any abnormal heart block on telemetry. Echocardiogram shows preserved EF, no acute valvular abnormality. Monitor for now Cardiology consulted for sinus bradycardia no further recommendation for now.  Acute renal failure superimposed on stage 4 chronic kidney disease ([place])- (present on admission) Baseline serum creatinine around 1.2.  On presentation serum creatinine around 1.8.  Now worsening up to 2.1. Receiving IV hydration.  No retention.  Monitor.  Dementia without behavioral disturbance ([place])- (present on admission) Lives alone. No focal deficit. No behavioral issues no agitation. Monitor. PT recommends SNF.  Unsafe to discharge back home.   Discussed with nephew who is patient's primary caregiver.  Patient would like to be DNR.     Subjective: No nausea  no vomiting no fever no chills.  Upset that she is not at home.  Physical Exam: Vitals:   06/03/21 0807 06/03/21 0808 06/03/21 0809 06/03/21 1418  BP: 123/66  128/89 (!) 117/54  Pulse: (!) 45 (!) 55 (!) 48 (!) 44  Resp: 18   18  Temp: 99 F (37.2 C)   98.7 F (37.1 C)  TempSrc: Oral   Oral  SpO2: 100% 99% 100% 100%  Weight:      Height:       General: Appear in mild distress; no visible Abnormal Neck Mass Or lumps, Conjunctiva normal Cardiovascular: S1 and S2 Present, no Murmur, Respiratory: good respiratory effort, Bilateral Air entry present and CTA, no Crackles, no wheezes Abdomen: Bowel Sound present Extremities: no Pedal edema Neurology: alert and not oriented. Gait not checked due to patient safety concerns   Data Reviewed:  I have Reviewed nursing notes, Vitals, and Lab results since pt's last encounter. Pertinent lab results CBC BMP I have ordered test including CBC BMP I have reviewed the last note from cardiology,  I have discussed pt's care plan and test results with cardiology.   Family Communication: Nephew who is primary caregiver  Disposition: Status is: Inpatient Remains inpatient appropriate because: Requiring IV hydration for AKI on CKD as well as further work-up for bradycardia.  Author: Berle Mull, MD 06/03/2021 7:14 PM  For on call review [email or web address].

## 2021-06-03 NOTE — Assessment & Plan Note (Addendum)
Baseline serum creatinine around 1.2.   On presentation serum creatinine around 1.8.  Now worsening up to 2.1. Renal function improved with hydration. Now remain stable.

## 2021-06-03 NOTE — Hospital Course (Addendum)
[name] is a 88 y.o. female with medical history significant of CKD4, demential, anemia, HLD, HTN. History from brother by phone. Patient lives alone but her brother looks in on her.  No significant bleeding in the hospital.  Hemoglobin remaining stable after transfusion. Etiology of the fall is still not clear. Sinus bradycardia seen on telemetry.  Cardiology was consulted. Developed acute kidney injury on 2/24 requiring IV fluid. 2/26 now medically stable.  Awaiting transfer to SNF.

## 2021-06-03 NOTE — Progress Notes (Signed)
Physical Therapy Treatment Patient Details Name: [name] MRN: [id number] DOB: [birth year] Today's Date: 06/03/2021   History of Present Illness 88 yo femaler admitted with anemia, fall, R LE pain. Pt was found down at home by family. Hx of dementia, falls    PT Comments    Pt assisted with ambulating however only able to tolerate short distance due to right knee pain.  Pt presents as high fall risk.  Continue to recommend SNF upon d/c.    Recommendations for follow up therapy are one component of a multi-disciplinary discharge planning process, led by the attending physician.  Recommendations may be updated based on patient status, additional functional criteria and insurance authorization.  Follow Up Recommendations  Skilled nursing-short term rehab (<3 hours/day)     Assistance Recommended at Discharge Frequent or constant Supervision/Assistance  Patient can return home with the following A little help with walking and/or transfers;A little help with bathing/dressing/bathroom;Assist for transportation;Assistance with cooking/housework;Help with stairs or ramp for entrance   Equipment Recommendations  Rolling walker (2 wheels)    Recommendations for Other Services       Precautions / Restrictions Precautions Precautions: Fall Precaution Comments: R knee pain Restrictions Weight Bearing Restrictions: No     Mobility  Bed Mobility Overal bed mobility: Needs Assistance Bed Mobility: Supine to Sit     Supine to sit: Min assist, HOB elevated     General bed mobility comments: assist for trunk upright, increased time due to right knee pain    Transfers Overall transfer level: Needs assistance Equipment used: Rolling walker (2 wheels) Transfers: Sit to/from Stand Sit to Stand: Min assist           General transfer comment: assist to rise and steady, cues for hand placement    Ambulation/Gait Ambulation/Gait assistance: Min assist Gait Distance (Feet): 30  Feet Assistive device: Rolling walker (2 wheels) Gait Pattern/deviations: Step-through pattern, Decreased stride length, Trunk flexed, Antalgic Gait velocity: decr     General Gait Details: Gait is antalgic 2* R knee pain.  multimodal cues to weight bear on RW to help with right knee pain, distance limited by pain   Stairs             Wheelchair Mobility    Modified Rankin (Stroke Patients Only)       Balance Overall balance assessment: Needs assistance         Standing balance support: Bilateral upper extremity supported, Reliant on assistive device for balance Standing balance-Leahy Scale: Poor                              Cognition Arousal/Alertness: Awake/alert Behavior During Therapy: WFL for tasks assessed/performed Overall Cognitive Status: History of cognitive impairments - at baseline                                 General Comments: Patient HOH, able to follow commands, poor short term memory        Exercises      General Comments        Pertinent Vitals/Pain Pain Assessment Pain Assessment: Faces Faces Pain Scale: Hurts even more Pain Location: R knee Pain Descriptors / Indicators: Discomfort, Grimacing, Guarding, Moaning Pain Intervention(s): Repositioned, Monitored during session    Home Living  Prior Function            PT Goals (current goals can now be found in the care plan section) Progress towards PT goals: Progressing toward goals    Frequency    Min 2X/week      PT Plan Current plan remains appropriate    Co-evaluation              AM-PAC PT "6 Clicks" Mobility   Outcome Measure  Help needed turning from your back to your side while in a flat bed without using bedrails?: A Lot Help needed moving from lying on your back to sitting on the side of a flat bed without using bedrails?: A Lot Help needed moving to and from a bed to a chair (including a  wheelchair)?: A Little Help needed standing up from a chair using your arms (e.g., wheelchair or bedside chair)?: A Little Help needed to walk in hospital room?: A Little Help needed climbing 3-5 steps with a railing? : A Lot 6 Click Score: 15    End of Session Equipment Utilized During Treatment: Gait belt Activity Tolerance: Patient limited by fatigue;Patient limited by pain Patient left: in chair;with call bell/phone within reach;with chair alarm set Nurse Communication: Mobility status PT Visit Diagnosis: History of falling (Z91.81);Muscle weakness (generalized) (M62.81);Difficulty in walking, not elsewhere classified (R26.2)     Time: 6761-9509 PT Time Calculation (min) (ACUTE ONLY): 13 min  Charges:  $Gait Training: 8-22 mins                    Jannette Spanner PT, DPT Acute Rehabilitation Services Pager: [phone] Office: [place] 06/03/2021, 3:37 PM

## 2021-06-03 NOTE — Assessment & Plan Note (Addendum)
No evidence of acute bleeding. Likely cause of patient's possible fall. Hemoglobin dropped on 2/25 as expected secondary to dilution. Repeat hemoglobin stable. Continue PPI.

## 2021-06-04 DIAGNOSIS — D649 Anemia, unspecified: Secondary | ICD-10-CM | POA: Diagnosis not present

## 2021-06-04 LAB — CBC
HCT: 24.5 % — ABNORMAL LOW (ref 36.0–46.0)
HCT: 25.2 % — ABNORMAL LOW (ref 36.0–46.0)
Hemoglobin: 7.7 g/dL — ABNORMAL LOW (ref 12.0–15.0)
Hemoglobin: 8 g/dL — ABNORMAL LOW (ref 12.0–15.0)
MCH: 30 pg (ref 26.0–34.0)
MCH: 30.5 pg (ref 26.0–34.0)
MCHC: 31.4 g/dL (ref 30.0–36.0)
MCHC: 31.7 g/dL (ref 30.0–36.0)
MCV: 95.3 fL (ref 80.0–100.0)
MCV: 96.2 fL (ref 80.0–100.0)
Platelets: 121 K/uL — ABNORMAL LOW (ref 150–400)
Platelets: 134 K/uL — ABNORMAL LOW (ref 150–400)
RBC: 2.57 MIL/uL — ABNORMAL LOW (ref 3.87–5.11)
RBC: 2.62 MIL/uL — ABNORMAL LOW (ref 3.87–5.11)
RDW: 17.4 % — ABNORMAL HIGH (ref 11.5–15.5)
RDW: 17.5 % — ABNORMAL HIGH (ref 11.5–15.5)
WBC: 4.6 K/uL (ref 4.0–10.5)
WBC: 4.8 K/uL (ref 4.0–10.5)
nRBC: 0 % (ref 0.0–0.2)
nRBC: 0 % (ref 0.0–0.2)

## 2021-06-04 LAB — BASIC METABOLIC PANEL WITH GFR
Anion gap: 4 — ABNORMAL LOW (ref 5–15)
BUN: 35 mg/dL — ABNORMAL HIGH (ref 8–23)
CO2: 19 mmol/L — ABNORMAL LOW (ref 22–32)
Calcium: 7.9 mg/dL — ABNORMAL LOW (ref 8.9–10.3)
Chloride: 110 mmol/L (ref 98–111)
Creatinine, Ser: 1.78 mg/dL — ABNORMAL HIGH (ref 0.44–1.00)
GFR, Estimated: 27 mL/min — ABNORMAL LOW
Glucose, Bld: 82 mg/dL (ref 70–99)
Potassium: 3.9 mmol/L (ref 3.5–5.1)
Sodium: 133 mmol/L — ABNORMAL LOW (ref 135–145)

## 2021-06-04 LAB — MAGNESIUM: Magnesium: 1.9 mg/dL (ref 1.7–2.4)

## 2021-06-04 NOTE — Progress Notes (Signed)
°  Progress Note   Patient: [name] NUU:[id number] DOB: 1932-09-20 DOA: 05/31/2021     Hospitalization day: 1 DOS: the patient was seen and examined on 06/04/2021   Brief hospital course:  FIZZA SCALES is a 88 y.o. female with medical history significant of CKD4, demential, anemia, HLD, HTN. History from brother by phone. Patient lives alone but her brother looks in on her.  No significant bleeding in the hospital.  Hemoglobin remaining stable after transfusion. Etiology of the fall is still not clear. Sinus bradycardia seen on telemetry.  Cardiology was consulted. Developed acute kidney injury on 2/24 requiring IV fluid.  Assessment and Plan: * Symptomatic anemia- (present on admission) No evidence of acute bleeding. Likely cause of patient's possible fall. Hemoglobin dropped on 2/25 as expected secondary to dilution.  Repeat hemoglobin stable. Continue transfusion for hemoglobin less than 7. Continue PPI.  Unwitnessed fall- (present on admission) Etiology not clear. Possibility of anemia causing the fall possibility of bradycardia causing the fall. While the patient does have bradycardia he does not appear to have any symptoms here in the hospital or any abnormal heart block on telemetry. Echocardiogram shows preserved EF, no acute valvular abnormality. Monitor for now Cardiology consulted for sinus bradycardia no further recommendation for now.  Patient not a candidate for pacemaker secondary to dementia per cardiology.  Acute renal failure superimposed on stage 4 chronic kidney disease ([place])- (present on admission) Baseline serum creatinine around 1.2.  On presentation serum creatinine around 1.8.  Now worsening up to 2.1. Renal function improved with hydration.  We will hold fluid and monitor renal function.  Dementia without behavioral disturbance ([place])- (present on admission) Lives alone. No focal deficit. No behavioral issues no agitation. Monitor. PT recommends SNF.   Unsafe to discharge back home.   Subjective: No nausea no vomiting no fever no chills.  Feeling better.  Physical Exam: Vitals:   06/03/21 2009 06/04/21 0631 06/04/21 1319 06/04/21 2012  BP: (!) 118/45 (!) 97/47 (!) 113/56 (!) 149/131  Pulse: (!) 45 (!) 59 (!) 44 61  Resp: 16 17 16 18   Temp: 98.8 F (37.1 C) 98.4 F (36.9 C) 97.9 F (36.6 C) 98.7 F (37.1 C)  TempSrc: Oral Oral Oral Oral  SpO2: 99% 98% 100% 99%  Weight:      Height:       General: Appear in mild distress; no visible Abnormal Neck Mass Or lumps, Conjunctiva normal Cardiovascular: S1 and S2 Present, no Murmur, Respiratory: good respiratory effort, Bilateral Air entry present and CTA, no Crackles, no wheezes Abdomen: Bowel Sound present Extremities: no Pedal edema  Data Reviewed:  I have Reviewed nursing notes, Vitals, and Lab results since pt's last encounter. Pertinent lab results CBC and BMP I have ordered test including CBC and BMP    Family Communication: Discussed with nephew at bedside  Disposition: Status is: Inpatient Remains inpatient appropriate because: Concern with regards to renal function and hemoglobin  Author: Berle Mull, MD 06/04/2021 8:46 PM  For on call review [email or web address].

## 2021-06-05 DIAGNOSIS — D649 Anemia, unspecified: Secondary | ICD-10-CM | POA: Diagnosis not present

## 2021-06-05 LAB — CBC
HCT: 30.4 % — ABNORMAL LOW (ref 36.0–46.0)
Hemoglobin: 9.3 g/dL — ABNORMAL LOW (ref 12.0–15.0)
MCH: 30 pg (ref 26.0–34.0)
MCHC: 30.6 g/dL (ref 30.0–36.0)
MCV: 98.1 fL (ref 80.0–100.0)
Platelets: 143 10*3/uL — ABNORMAL LOW (ref 150–400)
RBC: 3.1 MIL/uL — ABNORMAL LOW (ref 3.87–5.11)
RDW: 17.6 % — ABNORMAL HIGH (ref 11.5–15.5)
WBC: 4.5 10*3/uL (ref 4.0–10.5)
nRBC: 0 % (ref 0.0–0.2)

## 2021-06-05 LAB — BASIC METABOLIC PANEL
Anion gap: 6 (ref 5–15)
BUN: 36 mg/dL — ABNORMAL HIGH (ref 8–23)
CO2: 19 mmol/L — ABNORMAL LOW (ref 22–32)
Calcium: 8.7 mg/dL — ABNORMAL LOW (ref 8.9–10.3)
Chloride: 112 mmol/L — ABNORMAL HIGH (ref 98–111)
Creatinine, Ser: 1.8 mg/dL — ABNORMAL HIGH (ref 0.44–1.00)
GFR, Estimated: 27 mL/min — ABNORMAL LOW (ref >60)
Glucose, Bld: 82 mg/dL (ref 70–99)
Potassium: 4.2 mmol/L (ref 3.5–5.1)
Sodium: 137 mmol/L (ref 135–145)

## 2021-06-05 NOTE — Progress Notes (Signed)
Physical Therapy Treatment Patient Details Name: [name] MRN: [id number] DOB: November 16, 1932 Today's Date: 06/05/2021   History of Present Illness 88 yo femaler admitted with anemia, fall, R LE pain. Pt was found down at home by family. Hx of dementia, falls    PT Comments    Progressing slowly with mobility. Participates well but remains limited by R knee pain. She remains at risk for falls when mobilizing. Continue to recommend SNF for rehab.    Recommendations for follow up therapy are one component of a multi-disciplinary discharge planning process, led by the attending physician.  Recommendations may be updated based on patient status, additional functional criteria and insurance authorization.  Follow Up Recommendations  Skilled nursing-short term rehab (<3 hours/day)     Assistance Recommended at Discharge Frequent or constant Supervision/Assistance  Patient can return home with the following A little help with walking and/or transfers;A little help with bathing/dressing/bathroom;Assist for transportation;Assistance with cooking/housework;Help with stairs or ramp for entrance   Equipment Recommendations  Rolling walker (2 wheels) (youth height)    Recommendations for Other Services       Precautions / Restrictions Precautions Precautions: Fall Restrictions Weight Bearing Restrictions: No     Mobility  Bed Mobility Overal bed mobility: Needs Assistance Bed Mobility: Supine to Sit     Supine to sit: Mod assist     General bed mobility comments: Assist for bil LEs to limit pain. Increased time. Cues provided. Some dizziness initially but this resolved.    Transfers Overall transfer level: Needs assistance Equipment used: Rolling walker (2 wheels) Transfers: Sit to/from Stand Sit to Stand: Min assist   Step pivot transfers: Min assist       General transfer comment: assist to rise and steady. Step pivot, bed to bsc, using RW. Increased time. Multimodal  cueing for safety, technique.    Ambulation/Gait Ambulation/Gait assistance: Min assist Gait Distance (Feet): 35 Feet Assistive device: Rolling walker (2 wheels) Gait Pattern/deviations: Step-through pattern, Decreased stride length, Trunk flexed, Antalgic       General Gait Details: Gait is antalgic 2* R knee pain.  multimodal cues required. distance limited by pain.   Stairs             Wheelchair Mobility    Modified Rankin (Stroke Patients Only)       Balance Overall balance assessment: Needs assistance, History of Falls         Standing balance support: Bilateral upper extremity supported, Reliant on assistive device for balance Standing balance-Leahy Scale: Poor                              Cognition Arousal/Alertness: Awake/alert Behavior During Therapy: WFL for tasks assessed/performed Overall Cognitive Status: History of cognitive impairments - at baseline                                 General Comments: Patient HOH, able to follow commands, poor short term memory        Exercises      General Comments        Pertinent Vitals/Pain Pain Assessment Pain Assessment: Faces Faces Pain Scale: Hurts even more Pain Location: R knee Pain Descriptors / Indicators: Discomfort, Grimacing, Guarding, Moaning Pain Intervention(s): Limited activity within patient's tolerance, Monitored during session, Repositioned    Home Living  Prior Function            PT Goals (current goals can now be found in the care plan section) Progress towards PT goals: Progressing toward goals    Frequency    Min 2X/week      PT Plan Current plan remains appropriate    Co-evaluation              AM-PAC PT "6 Clicks" Mobility   Outcome Measure  Help needed turning from your back to your side while in a flat bed without using bedrails?: A Lot Help needed moving from lying on your back to  sitting on the side of a flat bed without using bedrails?: A Lot Help needed moving to and from a bed to a chair (including a wheelchair)?: A Little Help needed standing up from a chair using your arms (e.g., wheelchair or bedside chair)?: A Little Help needed to walk in hospital room?: A Little Help needed climbing 3-5 steps with a railing? : A Lot 6 Click Score: 15    End of Session Equipment Utilized During Treatment: Gait belt Activity Tolerance: Patient limited by fatigue;Patient limited by pain Patient left: in bed;with call bell/phone within reach;with bed alarm set   PT Visit Diagnosis: History of falling (Z91.81);Muscle weakness (generalized) (M62.81);Difficulty in walking, not elsewhere classified (R26.2) Pain - Right/Left: Right Pain - part of body: Knee     Time: 7564-3329 PT Time Calculation (min) (ACUTE ONLY): 14 min  Charges:  $Gait Training: 8-22 mins                         Doreatha Massed, PT Acute Rehabilitation  Office: [phone] Pager: [phone]

## 2021-06-05 NOTE — Plan of Care (Signed)

## 2021-06-05 NOTE — Progress Notes (Addendum)
MD made aware of patients' heart rate of 34. Patient in bed resting, no signs of distress noted. Bed locked armed and in lowest setting, remote and call light within reach. Will continue to monitor.    Marcy.Dane MD made aware of patients BP MD made aware patients yellow MEWS status Patient in bed resting, no signs of distress noted. Bed locked armed and in lowest level, remote and call light within reach. Will continue to monitor.   76 MD made aware of patients' reassessment. Patient in bed resting, no signs of distress noted, bed locked armed and in lowest level, remote and call light within reach. Will continue to monitor.

## 2021-06-05 NOTE — Progress Notes (Signed)
°  Progress Note   Patient: [name] XIP:[id number] DOB: 1932/06/30 DOA: 05/31/2021     Hospitalization day: 2 DOS: the patient was seen and examined on 06/05/2021   Brief hospital course:  RITHIKA SEEL is a 88 y.o. female with medical history significant of CKD4, demential, anemia, HLD, HTN. History from brother by phone. Patient lives alone but her brother looks in on her.  No significant bleeding in the hospital.  Hemoglobin remaining stable after transfusion. Etiology of the fall is still not clear. Sinus bradycardia seen on telemetry.  Cardiology was consulted. Developed acute kidney injury on 2/24 requiring IV fluid. 2/26 now medically stable.  Awaiting transfer to SNF.  Assessment and Plan: * Symptomatic anemia- (present on admission) No evidence of acute bleeding. Likely cause of patient's possible fall. Hemoglobin dropped on 2/25 as expected secondary to dilution.  Repeat hemoglobin stable. Continue transfusion for hemoglobin less than 7. Continue PPI.  Unwitnessed fall- (present on admission) Etiology not clear. Possibility of anemia causing the fall possibility of bradycardia causing the fall. While the patient does have bradycardia he does not appear to have any symptoms here in the hospital or any abnormal heart block on telemetry. Echocardiogram shows preserved EF, no acute valvular abnormality. Monitor for now Cardiology consulted for sinus bradycardia no further recommendation for now.  Patient not a candidate for pacemaker secondary to dementia per cardiology.  Acute renal failure superimposed on stage 4 chronic kidney disease ([place])- (present on admission) Baseline serum creatinine around 1.2.  On presentation serum creatinine around 1.8.  Now worsening up to 2.1. Renal function improved with hydration.  We will hold fluid and monitor renal function.  Dementia without behavioral disturbance ([place])- (present on admission) Lives alone. No focal deficit. No  behavioral issues no agitation. Monitor. PT recommends SNF.  Unsafe to discharge back home.        Subjective: More sleepy today although no other acute complaint.  No nausea no vomiting.  No other acute events overnight.  Physical Exam: Vitals:   06/05/21 0343 06/05/21 0540 06/05/21 1034 06/05/21 1326  BP: (!) 99/54 (!) 106/57 (!) 101/57 130/69  Pulse: (!) 47 (!) 52 (!) 53 (!) 51  Resp: 16 18 16 16   Temp: 98.2 F (36.8 C) (!) 97.5 F (36.4 C) 97.9 F (36.6 C) 98.1 F (36.7 C)  TempSrc: Oral Oral Oral Oral  SpO2: 99% 99% 100% 99%  Weight:      Height:       General: Appear in mild distress; no visible Abnormal Neck Mass Or lumps, Conjunctiva normal Cardiovascular: S1 and S2 Present, no Murmur, Respiratory: good respiratory effort, Bilateral Air entry present and CTA, no Crackles, no wheezes Abdomen: Bowel Sound present Extremities: no Pedal edema Neurology: alert and not oriented to time, place, and person Gait not checked due to patient safety concerns   Data Reviewed:  I have Reviewed nursing notes, Vitals, and Lab results since pt's last encounter. Pertinent lab results CBC and BMP I have ordered test including CBC and BMP    Family Communication: None at bedside  Disposition: Status is: Inpatient Remains inpatient appropriate because: Unsafe discharge awaiting placement and insurance authorization.  Author: Berle Mull, MD 06/05/2021 8:16 PM  For on call review [email or web address].

## 2021-06-06 DIAGNOSIS — I959 Hypotension, unspecified: Secondary | ICD-10-CM | POA: Diagnosis not present

## 2021-06-06 DIAGNOSIS — M25561 Pain in right knee: Secondary | ICD-10-CM | POA: Diagnosis not present

## 2021-06-06 DIAGNOSIS — F03918 Unspecified dementia, unspecified severity, with other behavioral disturbance: Secondary | ICD-10-CM | POA: Diagnosis not present

## 2021-06-06 DIAGNOSIS — E860 Dehydration: Secondary | ICD-10-CM | POA: Diagnosis not present

## 2021-06-06 DIAGNOSIS — E785 Hyperlipidemia, unspecified: Secondary | ICD-10-CM | POA: Diagnosis not present

## 2021-06-06 DIAGNOSIS — Z9181 History of falling: Secondary | ICD-10-CM | POA: Diagnosis not present

## 2021-06-06 DIAGNOSIS — Z7401 Bed confinement status: Secondary | ICD-10-CM | POA: Diagnosis not present

## 2021-06-06 DIAGNOSIS — Z79899 Other long term (current) drug therapy: Secondary | ICD-10-CM | POA: Diagnosis not present

## 2021-06-06 DIAGNOSIS — W19XXXD Unspecified fall, subsequent encounter: Secondary | ICD-10-CM | POA: Diagnosis not present

## 2021-06-06 DIAGNOSIS — I129 Hypertensive chronic kidney disease with stage 1 through stage 4 chronic kidney disease, or unspecified chronic kidney disease: Secondary | ICD-10-CM | POA: Diagnosis not present

## 2021-06-06 DIAGNOSIS — K219 Gastro-esophageal reflux disease without esophagitis: Secondary | ICD-10-CM | POA: Diagnosis not present

## 2021-06-06 DIAGNOSIS — I10 Essential (primary) hypertension: Secondary | ICD-10-CM | POA: Diagnosis not present

## 2021-06-06 DIAGNOSIS — I5032 Chronic diastolic (congestive) heart failure: Secondary | ICD-10-CM | POA: Diagnosis not present

## 2021-06-06 DIAGNOSIS — D62 Acute posthemorrhagic anemia: Secondary | ICD-10-CM | POA: Diagnosis not present

## 2021-06-06 DIAGNOSIS — I700 Atherosclerosis of aorta: Secondary | ICD-10-CM | POA: Diagnosis not present

## 2021-06-06 DIAGNOSIS — D649 Anemia, unspecified: Secondary | ICD-10-CM | POA: Diagnosis not present

## 2021-06-06 DIAGNOSIS — Z66 Do not resuscitate: Secondary | ICD-10-CM | POA: Diagnosis not present

## 2021-06-06 DIAGNOSIS — M160 Bilateral primary osteoarthritis of hip: Secondary | ICD-10-CM | POA: Diagnosis not present

## 2021-06-06 DIAGNOSIS — F0390 Unspecified dementia without behavioral disturbance: Secondary | ICD-10-CM | POA: Diagnosis not present

## 2021-06-06 DIAGNOSIS — R001 Bradycardia, unspecified: Secondary | ICD-10-CM | POA: Diagnosis not present

## 2021-06-06 DIAGNOSIS — N184 Chronic kidney disease, stage 4 (severe): Secondary | ICD-10-CM | POA: Diagnosis not present

## 2021-06-06 DIAGNOSIS — R630 Anorexia: Secondary | ICD-10-CM | POA: Diagnosis not present

## 2021-06-06 DIAGNOSIS — N179 Acute kidney failure, unspecified: Secondary | ICD-10-CM | POA: Diagnosis not present

## 2021-06-06 LAB — CBC WITH DIFFERENTIAL/PLATELET
Abs Immature Granulocytes: 0.02 10*3/uL (ref 0.00–0.07)
Basophils Absolute: 0.1 10*3/uL (ref 0.0–0.1)
Basophils Relative: 2 %
Eosinophils Absolute: 0.3 10*3/uL (ref 0.0–0.5)
Eosinophils Relative: 6 %
HCT: 25.3 % — ABNORMAL LOW (ref 36.0–46.0)
Hemoglobin: 8.1 g/dL — ABNORMAL LOW (ref 12.0–15.0)
Immature Granulocytes: 0 %
Lymphocytes Relative: 24 %
Lymphs Abs: 1.4 10*3/uL (ref 0.7–4.0)
MCH: 30.1 pg (ref 26.0–34.0)
MCHC: 32 g/dL (ref 30.0–36.0)
MCV: 94.1 fL (ref 80.0–100.0)
Monocytes Absolute: 0.8 10*3/uL (ref 0.1–1.0)
Monocytes Relative: 15 %
Neutro Abs: 3 10*3/uL (ref 1.7–7.7)
Neutrophils Relative %: 53 %
Platelets: 146 10*3/uL — ABNORMAL LOW (ref 150–400)
RBC: 2.69 MIL/uL — ABNORMAL LOW (ref 3.87–5.11)
RDW: 17.5 % — ABNORMAL HIGH (ref 11.5–15.5)
WBC: 5.7 10*3/uL (ref 4.0–10.5)
nRBC: 0 % (ref 0.0–0.2)

## 2021-06-06 LAB — COMPREHENSIVE METABOLIC PANEL
ALT: 11 U/L (ref 0–44)
AST: 13 U/L — ABNORMAL LOW (ref 15–41)
Albumin: 2.6 g/dL — ABNORMAL LOW (ref 3.5–5.0)
Alkaline Phosphatase: 41 U/L (ref 38–126)
Anion gap: 3 — ABNORMAL LOW (ref 5–15)
BUN: 38 mg/dL — ABNORMAL HIGH (ref 8–23)
CO2: 21 mmol/L — ABNORMAL LOW (ref 22–32)
Calcium: 8.7 mg/dL — ABNORMAL LOW (ref 8.9–10.3)
Chloride: 113 mmol/L — ABNORMAL HIGH (ref 98–111)
Creatinine, Ser: 1.82 mg/dL — ABNORMAL HIGH (ref 0.44–1.00)
GFR, Estimated: 27 mL/min — ABNORMAL LOW (ref >60)
Glucose, Bld: 86 mg/dL (ref 70–99)
Potassium: 5.1 mmol/L (ref 3.5–5.1)
Sodium: 137 mmol/L (ref 135–145)
Total Bilirubin: 0.3 mg/dL (ref 0.3–1.2)
Total Protein: 5.4 g/dL — ABNORMAL LOW (ref 6.5–8.1)

## 2021-06-06 LAB — MAGNESIUM: Magnesium: 2 mg/dL (ref 1.7–2.4)

## 2021-06-06 MED ORDER — ADULT MULTIVITAMIN W/MINERALS CH: 1.0000 | ORAL_TABLET | Freq: Every day | ORAL | 0 refills | Status: AC

## 2021-06-06 MED ORDER — DICLOFENAC SODIUM 1 % EX GEL
2.0000 g | Freq: Two times a day (BID) | CUTANEOUS | 0 refills | Status: AC
Start: 2021-06-06 — End: ?

## 2021-06-06 MED ORDER — ENSURE ENLIVE PO LIQD
237.0000 mL | Freq: Two times a day (BID) | ORAL | 12 refills | Status: AC
Start: 2021-06-06 — End: ?

## 2021-06-06 MED ORDER — FOLIC ACID 1 MG PO TABS: 1.0000 mg | ORAL_TABLET | Freq: Every day | ORAL | 0 refills | Status: AC

## 2021-06-06 MED ORDER — PANTOPRAZOLE SODIUM 40 MG PO TBEC: 40.0000 mg | DELAYED_RELEASE_TABLET | Freq: Every day | ORAL | 0 refills | Status: DC

## 2021-06-06 MED ORDER — FERROUS SULFATE 325 (65 FE) MG PO TABS: 325.0000 mg | ORAL_TABLET | Freq: Three times a day (TID) | ORAL | 0 refills | Status: AC

## 2021-06-06 NOTE — TOC Transition Note (Signed)
Transition of Care [facility]) - CM/SW Discharge Note   Patient Details  Name: SINCERE BERLANGA MRN: [id number] Date of Birth: 1932-04-26  Transition of Care [facility]) CM/SW Contact:  Ross Ludwig, LCSW Phone Number: 06/06/2021, 11:54 AM   Clinical Narrative:     Patient to be d/c'ed today to [facility] room 102.  Patient and family agreeable to plans will transport via ems RN to call report to [phone].  CSW spoke to patient's niece Ivin Booty via phone [phone] she is aware that patient is discharging today.  She did not express any other questions or concerns.     Final next level of care: Skilled Nursing Facility Barriers to Discharge: Barriers Resolved   Patient Goals and CMS Choice Patient states their goals for this hospitalization and ongoing recovery are:: To go to SNF for short term rehab then return back home. CMS Medicare.gov Compare Post Acute Care list provided to:: Patient Represenative (must comment) Choice offered to / list presented to : [place] / Guardian  Discharge Placement PASRR number recieved: 06/02/21            Patient chooses bed at: [place], [place] Patient to be transferred to facility by: [facility] Name of family member notified: Niece Ivin Booty, [phone] Patient and family notified of of transfer: 06/06/21  Discharge Plan and Services   Discharge Planning Services: CM Consult                                 Social Determinants of Health ([place]) Interventions     Readmission Risk Interventions No flowsheet data found.

## 2021-06-06 NOTE — Progress Notes (Signed)
Occupational Therapy Treatment Patient Details Name: [name] MRN: [id number] DOB: Jul 23, 1932 Today's Date: 06/06/2021   History of present illness 88 yo femaler admitted with anemia, fall, R LE pain. Pt was found down at home by family. Hx of dementia, falls   OT comments  Patient was able to participate in toileting tasks with min A for transitions ( sit to stand stand to sit) and min guard for standing tasks. Patient was able to participate in bathing tasks seated/standing with min A and consistent cues for safety. Patient was noted to have HR increase to 123 bpm with activity at end of session. Patient would continue to benefit from skilled OT services at this time while admitted and after d/c to address noted deficits in order to improve overall safety and independence in ADLs.     Recommendations for follow up therapy are one component of a multi-disciplinary discharge planning process, led by the attending physician.  Recommendations may be updated based on patient status, additional functional criteria and insurance authorization.    Follow Up Recommendations  Skilled nursing-short term rehab (<3 hours/day)    Assistance Recommended at Discharge Frequent or constant Supervision/Assistance  Patient can return home with the following  A little help with walking and/or transfers;A little help with bathing/dressing/bathroom;Assistance with cooking/housework;Direct supervision/assist for financial management;Help with stairs or ramp for entrance   Equipment Recommendations  Tub/shower bench    Recommendations for Other Services      Precautions / Restrictions Precautions Precautions: Fall Precaution Comments: R knee pain Restrictions Weight Bearing Restrictions: No       Mobility Bed Mobility Overal bed mobility: Needs Assistance Bed Mobility: Supine to Sit     Supine to sit: Min assist, HOB elevated     General bed mobility comments: with increased time     Transfers                         Balance Overall balance assessment: Needs assistance, History of Falls Sitting-balance support: No upper extremity supported, Feet supported Sitting balance-Leahy Scale: Fair     Standing balance support: Single extremity supported, During functional activity Standing balance-Leahy Scale: Fair                             ADL either performed or assessed with clinical judgement   ADL Overall ADL's : Needs assistance/impaired     Grooming: Set up;Sitting;Wash/dry hands;Wash/dry face   Upper Body Bathing: Set up;Sitting Upper Body Bathing Details (indicate cue type and reason): in recliner Lower Body Bathing: Minimal assistance;Sit to/from stand;Sitting/lateral leans;Min guard Lower Body Bathing Details (indicate cue type and reason): to reach feet. min guard when standing. Upper Body Dressing : Set up;Sitting Upper Body Dressing Details (indicate cue type and reason): in recliner     Toilet Transfer: Minimal assistance;Min guard;Rolling walker (2 wheels);Ambulation;Regular Glass blower/designer Details (indicate cue type and reason): cues to keep walker close to her and for safety. Toileting- Clothing Manipulation and Hygiene: Minimal assistance;Cueing for safety Toileting - Clothing Manipulation Details (indicate cue type and reason): to manage clothing, cueing for hand placement and safety with RW       General ADL Comments: Overall min guard to ambulate with walker to bathroom with cues to keep RW closer    Extremity/Trunk Assessment              Vision       Perception  Praxis      Cognition Arousal/Alertness: Awake/alert Behavior During Therapy: WFL for tasks assessed/performed Overall Cognitive Status: History of cognitive impairments - at baseline                                 General Comments: Patient HOH, able to follow commands, poor short term memory        Exercises       Shoulder Instructions       General Comments      Pertinent Vitals/ Pain       Pain Assessment Pain Assessment: Faces Faces Pain Scale: Hurts little more Pain Location: R knee Pain Descriptors / Indicators: Discomfort, Grimacing, Guarding, Moaning Pain Intervention(s): Limited activity within patient's tolerance, Monitored during session, Premedicated before session  Home Living                                          Prior Functioning/Environment              Frequency  Min 2X/week        Progress Toward Goals  OT Goals(current goals can now be found in the care plan section)  Progress towards OT goals: Progressing toward goals     Plan Discharge plan remains appropriate    Co-evaluation                 AM-PAC OT "6 Clicks" Daily Activity     Outcome Measure   Help from another person eating meals?: A Little Help from another person taking care of personal grooming?: A Little Help from another person toileting, which includes using toliet, bedpan, or urinal?: A Little Help from another person bathing (including washing, rinsing, drying)?: A Little Help from another person to put on and taking off regular upper body clothing?: A Little Help from another person to put on and taking off regular lower body clothing?: A Little 6 Click Score: 18    End of Session Equipment Utilized During Treatment: Rolling walker (2 wheels);Gait belt  OT Visit Diagnosis: History of falling (Z91.81);Pain   Activity Tolerance Patient tolerated treatment well   Patient Left in chair;with call bell/phone within reach;with chair alarm set   Nurse Communication Mobility status        Time: 6378-5885 OT Time Calculation (min): 23 min  Charges: OT General Charges $OT Visit: 1 Visit OT Treatments $Self Care/Home Management : 23-37 mins  Jackelyn Poling OTR/L, MS Acute Rehabilitation Department Office# [phone] Pager#  [phone]   Marcellina Millin 06/06/2021, 9:00 AM

## 2021-06-06 NOTE — TOC Progression Note (Signed)
Transition of Care [facility]) - Progression Note    Patient Details  Name: [name] MRN: [id number] Date of Birth: 09/21/1932  Transition of Care [facility]) CM/SW Contact  Ross Ludwig, [place] Phone Number: 06/06/2021, 10:41 AM  Clinical Narrative:    CSW spoke to [employer], authorization is good until Thursday March 2nd.  CSW spoke to [employer], they can accept patient today if she is medically ready for discharge.  CSW updated attending physician and bedside nurse.   Expected Discharge Plan: Skilled Nursing Facility Barriers to Discharge: SNF Pending bed offer  Expected Discharge Plan and Services Expected Discharge Plan: [place]   Discharge Planning Services: CM Consult   Living arrangements for the past 2 months: Single Family Home                                       Social Determinants of Health (SDOH) Interventions    Readmission Risk Interventions No flowsheet data found.

## 2021-06-06 NOTE — Discharge Summary (Signed)
Physician Discharge Summary   Patient: [name] MRN: [id number] DOB: 05-15-1932  Admit date:     05/31/2021  Discharge date: 06/06/21  Discharge Physician: Berle Mull   PCP: Burnard Bunting, MD   Recommendations at discharge:   Follow up with PCP in 1 week Recommend referral to Palliative care for continue to engage in talks about goals of care .chlm Discharge Diagnoses: Principal Problem:   Symptomatic anemia Active Problems:   Unwitnessed fall   Dementia without behavioral disturbance ([place])   Fall at home, initial encounter   Acute renal failure superimposed on stage 4 chronic kidney disease [facility])  Hospital Course:  [name] is a 88 y.o. female with medical history significant of CKD4, demential, anemia, HLD, HTN. History from brother by phone. Patient lives alone but her brother looks in on her.  No significant bleeding in the hospital.  Hemoglobin remaining stable after transfusion. Etiology of the fall is still not clear. Sinus bradycardia seen on telemetry.  Cardiology was consulted. Developed acute kidney injury on 2/24 requiring IV fluid. 2/26 now medically stable.  Awaiting transfer to SNF.  Assessment and Plan: * Symptomatic anemia- (present on admission) No evidence of acute bleeding. Likely cause of patient's possible fall. Hemoglobin dropped on 2/25 as expected secondary to dilution. Repeat hemoglobin stable. Continue PPI.  Unwitnessed fall- (present on admission) Etiology not clear. Possibility of anemia causing the fall possibility of bradycardia causing the fall. While the patient does have bradycardia he does not appear to have any symptoms here in the hospital or any abnormal heart block on telemetry. Echocardiogram shows preserved EF, no acute valvular abnormality. Monitor for now Cardiology consulted for sinus bradycardia no further recommendation for now.  Patient not a candidate for pacemaker secondary to dementia per cardiology.  Acute  renal failure superimposed on stage 4 chronic kidney disease ([place])- (present on admission) Baseline serum creatinine around 1.2.   On presentation serum creatinine around 1.8.  Now worsening up to 2.1. Renal function improved with hydration. Now remain stable.  Dementia without behavioral disturbance ([place])- (present on admission) Lives alone. No focal deficit. No behavioral issues no agitation. PT recommends SNF.  Consultants: Cardiology Procedures performed: Echocardiogram   Disposition: Skilled nursing facility Diet recommendation:  Discharge Diet Orders (From admission, onward)     Start     Ordered   06/06/21 0000  Diet general        06/06/21 1123           DISCHARGE MEDICATION: Allergies as of 06/06/2021       Reactions   Benadryl [diphenhydramine Hcl (sleep)] Other (See Comments)   Light headed, heart racing.    Ciprofloxacin Other (See Comments)   Leg pains   Pravastatin Other (See Comments)   Leg cramps   Vioxx [rofecoxib] Nausea Only   Cephalosporins Rash        Medication List     STOP taking these medications    naproxen sodium 220 MG tablet Commonly known as: ALEVE       TAKE these medications    diclofenac Sodium 1 % Gel Commonly known as: VOLTAREN Apply 2 g topically 2 (two) times daily.   donepezil 10 MG tablet Commonly known as: ARICEPT Take 10 mg by mouth at bedtime.   feeding supplement Liqd Take 237 mLs by mouth 2 (two) times daily between meals.   ferrous sulfate 325 (65 FE) MG tablet Take 1 tablet (325 mg total) by mouth 3 (three) times daily with  meals.   folic acid 1 MG tablet Commonly known as: FOLVITE Take 1 tablet (1 mg total) by mouth daily. Start taking on: June 07, 2021   multivitamin with minerals Tabs tablet Take 1 tablet by mouth daily. Start taking on: June 07, 2021   pantoprazole 40 MG tablet Commonly known as: PROTONIX Take 1 tablet (40 mg total) by mouth daily. Start taking on: June 07, 2021        Contact information for follow-up providers     Burnard Bunting, MD. Schedule an appointment as soon as possible for a visit in 1 week(s).   Specialty: Internal Medicine Contact information: [place] [address] [phone]              Contact information for after-discharge care     Destination     [facility]erred SNF .   Service: Skilled Nursing Contact information: [place] [phone]                     Discharge Exam: Danley Danker Weights   05/31/21 2312  Weight: 46 kg   General: Appear in mild distress, no Rash; Oral Mucosa Clear, moist. no Abnormal Neck Mass Or lumps, Conjunctiva normal  Cardiovascular: S1 and S2 Present, no Murmur, Respiratory: good respiratory effort, Bilateral Air entry present and CTA, no Crackles, no wheezes Abdomen: Bowel Sound present, Soft and no tenderness Extremities: no Pedal edema Neurology: alert and oriented to time, place, and person affect appropriate. no new focal deficit Gait not checked due to patient safety concerns   Condition at discharge: good  The results of significant diagnostics from this hospitalization (including imaging, microbiology, ancillary and laboratory) are listed below for reference.   Imaging Studies: CT Head Wo Contrast  Result Date: 05/31/2021 CLINICAL DATA:  Head trauma EXAM: CT HEAD WITHOUT CONTRAST CT MAXILLOFACIAL WITHOUT CONTRAST TECHNIQUE: Multidetector CT imaging of the head and maxillofacial structures were performed using the standard protocol without intravenous contrast. Multiplanar CT image reconstructions of the maxillofacial structures were also generated. RADIATION DOSE REDUCTION: This exam was performed according to the departmental dose-optimization program which includes automated exposure control, adjustment of the mA and/or kV according to patient size and/or use of  iterative reconstruction technique. COMPARISON:  Brain MRI 03/22/2018, CT head and maxillofacial CT 10/10/2018 FINDINGS: CT HEAD FINDINGS Brain: There is no evidence of acute intracranial hemorrhage, extra-axial fluid collection, or acute infarct. Background parenchymal volume is normal for age. The ventricles are stable in size. There is a mild burden of white matter microangiopathic change. There is no mass lesion. There is no midline shift. Vascular: There is calcification of the bilateral cavernous ICAs. Skull: Normal. Negative for fracture or focal lesion. Other: None. CT MAXILLOFACIAL FINDINGS Osseous: There is no acute facial bone fracture. There is no evidence of mandibular dislocation. There is no suspicious osseous lesion. There is advanced degenerative change of the left temporomandibular joint. Orbits: Bilateral lens implants are in place. The globes and orbits are otherwise unremarkable. Sinuses: There is soap bubble appearing fluid in the left sphenoid sinus. The other paranasal sinuses are clear. Soft tissues: Unremarkable. IMPRESSION: 1. No acute intracranial hemorrhage or calvarial fracture. 2. No acute facial bone fracture. Electronically Signed   By: Valetta Mole M.D.   On: 05/31/2021 12:24   CT Cervical Spine Wo Contrast  Result Date: 05/31/2021 CLINICAL DATA:  Trauma EXAM: CT CERVICAL SPINE WITHOUT CONTRAST TECHNIQUE: Multidetector CT imaging of  the cervical spine was performed without intravenous contrast. Multiplanar CT image reconstructions were also generated. RADIATION DOSE REDUCTION: This exam was performed according to the departmental dose-optimization program which includes automated exposure control, adjustment of the mA and/or kV according to patient size and/or use of iterative reconstruction technique. COMPARISON:  Cervical spine CT 10/10/2018 FINDINGS: Alignment: There is grade 1 retrolisthesis C3 on C4 and C4 on C5, and grade 1 anterolisthesis C5 on C6 and C6 on C7, unchanged  and likely degenerative in nature. There is no jumped or perched facets or other evidence of traumatic malalignment. Skull base and vertebrae: Skull base alignment is maintained. Vertebral body heights are preserved. There is no evidence of acute fracture. Soft tissues and spinal canal: No prevertebral fluid or swelling. No visible canal hematoma. Disc levels: There is bulky degenerative pannus about the dens without evidence of mass effect on the underlying cord. There is advanced multilevel intervertebral disc space narrowing and degenerative endplate change, most advanced at C3-C4 and C4-C5. There is advanced multilevel facet arthropathy, most advanced on the left at C2-C3 and C5-C6. There is probable moderate spinal canal stenosis at C4-C5 and up to severe neural foraminal stenosis on the right at C3-C4 and on the left at C4-C5. Findings are overall not significantly changed. Upper chest: The imaged lung apices are clear. Other: There is a hypodense right thyroid nodule measuring up to 1.2 cm, not significantly changed since 10/10/2018. IMPRESSION: 1. No acute fracture or traumatic malalignment of the cervical spine. 2. Advanced multilevel degenerative changes and degenerative spondylolisthesis as detailed above. Electronically Signed   By: Valetta Mole M.D.   On: 05/31/2021 12:30   DG Knee Complete 4 Views Right  Result Date: 05/31/2021 CLINICAL DATA:  Fall. Found on floor. Right knee pain. EXAM: RIGHT KNEE - COMPLETE 4+ VIEW COMPARISON:  Right knee radiographs 07/25/2017. FINDINGS: The right knee is located. Progressive Vance tricompartmental degenerative changes are present. No significant effusion is present. Vascular calcifications are noted. IMPRESSION: 1. Progressive tricompartmental degenerative changes of the right knee. 2. No acute abnormality. 3. Atherosclerosis. Electronically Signed   By: San Morelle M.D.   On: 05/31/2021 11:53   ECHOCARDIOGRAM COMPLETE  Result Date: 06/01/2021     ECHOCARDIOGRAM REPORT   Patient Name:   AZALIYAH KENNARD Zaugg Date of Exam: 06/01/2021 Medical Rec #:  [id number]     Height:       62.0 in Accession #:    [id number]    Weight:       101.4 lb Date of Birth:  1932-12-31     BSA:          1.432 m Patient Age:    79 years      BP:           103/50 mmHg Patient Gender: F             HR:           52 bpm. Exam Location:  Inpatient Procedure: 2D Echo Indications:    Syncope  History:        Patient has no prior history of Echocardiogram examinations.                 Risk Factors:Hypertension.  Sonographer:    Beryle Beams Referring Phys: [id number] Gary  1. Left ventricular ejection fraction, by estimation, is 60 to 65%. The left ventricle has normal function. The left ventricle has no regional wall motion abnormalities. Left ventricular diastolic parameters are  indeterminate.  2. Right ventricular systolic function is normal. The right ventricular size is normal. There is normal pulmonary artery systolic pressure.  3. Left atrial size was mildly dilated.  4. The mitral valve is normal in structure. Mild mitral valve regurgitation. No evidence of mitral stenosis.  5. The aortic valve is tricuspid. There is mild calcification of the aortic valve. Aortic valve regurgitation is mild. Aortic valve sclerosis is present, with no evidence of aortic valve stenosis.  6. The inferior vena cava is normal in size with greater than 50% respiratory variability, suggesting right atrial pressure of 3 mmHg. FINDINGS  Left Ventricle: Left ventricular ejection fraction, by estimation, is 60 to 65%. The left ventricle has normal function. The left ventricle has no regional wall motion abnormalities. The left ventricular internal cavity size was normal in size. There is  no left ventricular hypertrophy. Left ventricular diastolic parameters are indeterminate. Right Ventricle: The right ventricular size is normal. No increase in right ventricular wall thickness. Right ventricular  systolic function is normal. There is normal pulmonary artery systolic pressure. The tricuspid regurgitant velocity is 2.21 m/s, and  with an assumed right atrial pressure of 10 mmHg, the estimated right ventricular systolic pressure is 00.9 mmHg. Left Atrium: Left atrial size was mildly dilated. Right Atrium: Right atrial size was normal in size. Pericardium: There is no evidence of pericardial effusion. Mitral Valve: The mitral valve is normal in structure. Mild mitral valve regurgitation. No evidence of mitral valve stenosis. Tricuspid Valve: The tricuspid valve is normal in structure. Tricuspid valve regurgitation is mild . No evidence of tricuspid stenosis. Aortic Valve: The aortic valve is tricuspid. There is mild calcification of the aortic valve. Aortic valve regurgitation is mild. Aortic regurgitation PHT measures 897 msec. Aortic valve sclerosis is present, with no evidence of aortic valve stenosis. Aortic valve peak gradient measures 6.8 mmHg. Pulmonic Valve: The pulmonic valve was normal in structure. Pulmonic valve regurgitation is trivial. No evidence of pulmonic stenosis. Aorta: The aortic root is normal in size and structure. Venous: The inferior vena cava is normal in size with greater than 50% respiratory variability, suggesting right atrial pressure of 3 mmHg. IAS/Shunts: No atrial level shunt detected by color flow Doppler.  LEFT VENTRICLE PLAX 2D LVIDd:         4.40 cm   Diastology LVIDs:         3.00 cm   LV e' medial:    7.70 cm/s LV PW:         0.90 cm   LV E/e' medial:  10.3 LV IVS:        1.00 cm   LV e' lateral:   9.16 cm/s LVOT diam:     2.00 cm   LV E/e' lateral: 8.7 LV SV:         90 LV SV Index:   63 LVOT Area:     3.14 cm  RIGHT VENTRICLE         IVC TAPSE (M-mode): 1.8 cm  IVC diam: 1.80 cm LEFT ATRIUM             Index        RIGHT ATRIUM           Index LA diam:        3.30 cm 2.30 cm/m   RA Area:     12.10 cm LA Vol (A2C):   45.9 ml 32.04 ml/m  RA Volume:   27.40 ml  19.13  ml/m LA Vol (  A4C):   52.3 ml 36.51 ml/m LA Biplane Vol: 51.8 ml 36.16 ml/m  AORTIC VALVE                 PULMONIC VALVE AV Area (Vmax): 3.09 cm     PV Vmax:       0.73 m/s AV Vmax:        130.00 cm/s  PV Peak grad:  2.1 mmHg AV Peak Grad:   6.8 mmHg LVOT Vmax:      128.00 cm/s LVOT Vmean:     71.200 cm/s LVOT VTI:       0.287 m AI PHT:         897 msec  AORTA Ao Root diam: 3.20 cm Ao Asc diam:  3.40 cm MITRAL VALVE               TRICUSPID VALVE MV Area (PHT): 3.68 cm    TR Peak grad:   19.5 mmHg MV Decel Time: 206 msec    TR Vmax:        221.00 cm/s MV E velocity: 79.30 cm/s MV A velocity: 64.30 cm/s  SHUNTS MV E/A ratio:  1.23        Systemic VTI:  0.29 m                            Systemic Diam: 2.00 cm Candee Furbish MD Electronically signed by Candee Furbish MD Signature Date/Time: 06/01/2021/11:29:26 AM    Final    DG Hip Unilat W or Wo Pelvis 2-3 Views Right  Result Date: 05/31/2021 CLINICAL DATA:  Fall. Found on floor. Right hip pain. EXAM: DG HIP (WITH OR WITHOUT PELVIS) 2-3V RIGHT COMPARISON:  None. FINDINGS: The right hip is located. Moderate asymmetric degenerative changes are present. No acute fracture is present. Visualized pelvis demonstrates no acute fracture. Degenerative changes are present in the lower lumbar spine. IMPRESSION: 1. No acute abnormality. 2. Moderate asymmetric degenerative changes of the right hip. Electronically Signed   By: San Morelle M.D.   On: 05/31/2021 11:52   CT Maxillofacial Wo Contrast  Result Date: 05/31/2021 CLINICAL DATA:  Head trauma EXAM: CT HEAD WITHOUT CONTRAST CT MAXILLOFACIAL WITHOUT CONTRAST TECHNIQUE: Multidetector CT imaging of the head and maxillofacial structures were performed using the standard protocol without intravenous contrast. Multiplanar CT image reconstructions of the maxillofacial structures were also generated. RADIATION DOSE REDUCTION: This exam was performed according to the departmental dose-optimization program which includes  automated exposure control, adjustment of the mA and/or kV according to patient size and/or use of iterative reconstruction technique. COMPARISON:  Brain MRI 03/22/2018, CT head and maxillofacial CT 10/10/2018 FINDINGS: CT HEAD FINDINGS Brain: There is no evidence of acute intracranial hemorrhage, extra-axial fluid collection, or acute infarct. Background parenchymal volume is normal for age. The ventricles are stable in size. There is a mild burden of white matter microangiopathic change. There is no mass lesion. There is no midline shift. Vascular: There is calcification of the bilateral cavernous ICAs. Skull: Normal. Negative for fracture or focal lesion. Other: None. CT MAXILLOFACIAL FINDINGS Osseous: There is no acute facial bone fracture. There is no evidence of mandibular dislocation. There is no suspicious osseous lesion. There is advanced degenerative change of the left temporomandibular joint. Orbits: Bilateral lens implants are in place. The globes and orbits are otherwise unremarkable. Sinuses: There is soap bubble appearing fluid in the left sphenoid sinus. The other paranasal sinuses are clear. Soft tissues: Unremarkable. IMPRESSION:  1. No acute intracranial hemorrhage or calvarial fracture. 2. No acute facial bone fracture. Electronically Signed   By: Valetta Mole M.D.   On: 05/31/2021 12:24    Microbiology: Results for orders placed or performed during the hospital encounter of 05/31/21  Resp Panel by RT-PCR (Flu A&B, Covid) Nasopharyngeal Swab     Status: None   Collection Time: 05/31/21  2:23 PM   Specimen: Nasopharyngeal Swab; Nasopharyngeal(NP) swabs in vial transport medium  Result Value Ref Range Status   SARS Coronavirus 2 by RT PCR NEGATIVE NEGATIVE Final    Comment: (NOTE) SARS-CoV-2 target nucleic acids are NOT DETECTED.  The SARS-CoV-2 RNA is generally detectable in upper respiratory specimens during the acute phase of infection. The lowest concentration of SARS-CoV-2 viral  copies this assay can detect is 138 copies/mL. A negative result does not preclude SARS-Cov-2 infection and should not be used as the sole basis for treatment or other patient management decisions. A negative result may occur with  improper specimen collection/handling, submission of specimen other than nasopharyngeal swab, presence of viral mutation(s) within the areas targeted by this assay, and inadequate number of viral copies(<138 copies/mL). A negative result must be combined with clinical observations, patient history, and epidemiological information. The expected result is Negative.  Fact Sheet for Patients:  EntrepreneurPulse.com.au  Fact Sheet for Healthcare Providers:  IncredibleEmployment.be  This test is no t yet approved or cleared by the [place] FDA and  has been authorized for detection and/or diagnosis of SARS-CoV-2 by FDA under an Emergency Use Authorization (EUA). This EUA will remain  in effect (meaning this test can be used) for the duration of the COVID-19 declaration under Section 564(b)(1) of the Act, 21 U.S.C.section 360bbb-3(b)(1), unless the authorization is terminated  or revoked sooner.       Influenza A by PCR NEGATIVE NEGATIVE Final   Influenza B by PCR NEGATIVE NEGATIVE Final    Comment: (NOTE) The Xpert Xpress SARS-CoV-2/FLU/RSV plus assay is intended as an aid in the diagnosis of influenza from Nasopharyngeal swab specimens and should not be used as a sole basis for treatment. Nasal washings and aspirates are unacceptable for Xpert Xpress SARS-CoV-2/FLU/RSV testing.  Fact Sheet for Patients: EntrepreneurPulse.com.au  Fact Sheet for Healthcare Providers: IncredibleEmployment.be  This test is not yet approved or cleared by the [place] FDA and has been authorized for detection and/or diagnosis of SARS-CoV-2 by FDA under an Emergency Use Authorization (EUA). This  EUA will remain in effect (meaning this test can be used) for the duration of the COVID-19 declaration under Section 564(b)(1) of the Act, 21 U.S.C. section 360bbb-3(b)(1), unless the authorization is terminated or revoked.  Performed at [facility], [place] [address], [place], [place] [address]     Labs: CBC: Recent Labs  Lab 05/31/21 1110 05/31/21 2259 06/03/21 0530 06/04/21 0521 06/04/21 1623 06/05/21 0605 06/06/21 0512  WBC 7.0   < > 6.3 4.6 4.8 4.5 5.7  NEUTROABS 5.3  --   --   --   --   --  3.0  HGB 6.1*   < > 9.2* 7.7* 8.0* 9.3* 8.1*  HCT 19.3*   < > 28.6* 24.5* 25.2* 30.4* 25.3*  MCV 93.7   < > 93.2 95.3 96.2 98.1 94.1  PLT 184   < > 153 121* 134* 143* 146*   < > = values in this interval not displayed.   Basic Metabolic Panel: Recent Labs  Lab 06/02/21 0516 06/03/21 0530 06/04/21 0521 06/05/21  2956 06/06/21 0512  NA 133* 136 133* 137 137  K 4.4 4.6 3.9 4.2 5.1  CL 111 109 110 112* 113*  CO2 19* 22 19* 19* 21*  GLUCOSE 77 90 82 82 86  BUN 39* 42* 35* 36* 38*  CREATININE 1.83* 2.10* 1.78* 1.80* 1.82*  CALCIUM 8.4* 8.5* 7.9* 8.7* 8.7*  MG 1.9 2.0 1.9  --  2.0   Liver Function Tests: Recent Labs  Lab 06/01/21 0517 06/06/21 0512  AST 20 13*  ALT 11 11  ALKPHOS 37* 41  BILITOT 0.7 0.3  PROT 5.3* 5.4*  ALBUMIN 2.6* 2.6*   CBG: No results for input(s): GLUCAP in the last 168 hours.  Discharge time spent: greater than 30 minutes.  Signed: Berle Mull, MD [facility] 06/06/2021

## 2021-06-07 DIAGNOSIS — D649 Anemia, unspecified: Secondary | ICD-10-CM | POA: Diagnosis not present

## 2021-06-07 DIAGNOSIS — N184 Chronic kidney disease, stage 4 (severe): Secondary | ICD-10-CM | POA: Diagnosis not present

## 2021-06-07 DIAGNOSIS — F0390 Unspecified dementia without behavioral disturbance: Secondary | ICD-10-CM | POA: Diagnosis not present

## 2021-06-07 DIAGNOSIS — I129 Hypertensive chronic kidney disease with stage 1 through stage 4 chronic kidney disease, or unspecified chronic kidney disease: Secondary | ICD-10-CM | POA: Diagnosis not present

## 2021-06-07 DIAGNOSIS — Z9181 History of falling: Secondary | ICD-10-CM | POA: Diagnosis not present

## 2021-06-08 DIAGNOSIS — F0390 Unspecified dementia without behavioral disturbance: Secondary | ICD-10-CM | POA: Diagnosis not present

## 2021-06-08 DIAGNOSIS — D62 Acute posthemorrhagic anemia: Secondary | ICD-10-CM | POA: Diagnosis not present

## 2021-06-08 DIAGNOSIS — I700 Atherosclerosis of aorta: Secondary | ICD-10-CM | POA: Diagnosis not present

## 2021-06-08 DIAGNOSIS — N184 Chronic kidney disease, stage 4 (severe): Secondary | ICD-10-CM | POA: Diagnosis not present

## 2021-06-24 DIAGNOSIS — Z66 Do not resuscitate: Secondary | ICD-10-CM | POA: Diagnosis not present

## 2021-06-24 DIAGNOSIS — D62 Acute posthemorrhagic anemia: Secondary | ICD-10-CM | POA: Diagnosis not present

## 2021-06-24 DIAGNOSIS — I5032 Chronic diastolic (congestive) heart failure: Secondary | ICD-10-CM | POA: Diagnosis not present

## 2021-06-24 DIAGNOSIS — F0390 Unspecified dementia without behavioral disturbance: Secondary | ICD-10-CM | POA: Diagnosis not present

## 2021-06-25 DIAGNOSIS — R001 Bradycardia, unspecified: Secondary | ICD-10-CM | POA: Diagnosis not present

## 2021-06-25 DIAGNOSIS — D631 Anemia in chronic kidney disease: Secondary | ICD-10-CM | POA: Diagnosis not present

## 2021-06-25 DIAGNOSIS — F0390 Unspecified dementia without behavioral disturbance: Secondary | ICD-10-CM | POA: Diagnosis not present

## 2021-06-25 DIAGNOSIS — N179 Acute kidney failure, unspecified: Secondary | ICD-10-CM | POA: Diagnosis not present

## 2021-06-25 DIAGNOSIS — I700 Atherosclerosis of aorta: Secondary | ICD-10-CM | POA: Diagnosis not present

## 2021-06-25 DIAGNOSIS — Z9181 History of falling: Secondary | ICD-10-CM | POA: Diagnosis not present

## 2021-06-25 DIAGNOSIS — E785 Hyperlipidemia, unspecified: Secondary | ICD-10-CM | POA: Diagnosis not present

## 2021-06-25 DIAGNOSIS — E860 Dehydration: Secondary | ICD-10-CM | POA: Diagnosis not present

## 2021-06-25 DIAGNOSIS — M150 Primary generalized (osteo)arthritis: Secondary | ICD-10-CM | POA: Diagnosis not present

## 2021-06-25 DIAGNOSIS — I129 Hypertensive chronic kidney disease with stage 1 through stage 4 chronic kidney disease, or unspecified chronic kidney disease: Secondary | ICD-10-CM | POA: Diagnosis not present

## 2021-06-25 DIAGNOSIS — N184 Chronic kidney disease, stage 4 (severe): Secondary | ICD-10-CM | POA: Diagnosis not present

## 2021-06-28 DIAGNOSIS — I129 Hypertensive chronic kidney disease with stage 1 through stage 4 chronic kidney disease, or unspecified chronic kidney disease: Secondary | ICD-10-CM | POA: Diagnosis not present

## 2021-06-28 DIAGNOSIS — M154 Erosive (osteo)arthritis: Secondary | ICD-10-CM | POA: Diagnosis not present

## 2021-06-28 DIAGNOSIS — N1832 Chronic kidney disease, stage 3b: Secondary | ICD-10-CM | POA: Diagnosis not present

## 2021-06-28 DIAGNOSIS — F03B0 Unspecified dementia, moderate, without behavioral disturbance, psychotic disturbance, mood disturbance, and anxiety: Secondary | ICD-10-CM | POA: Diagnosis not present

## 2021-06-28 DIAGNOSIS — K219 Gastro-esophageal reflux disease without esophagitis: Secondary | ICD-10-CM | POA: Diagnosis not present

## 2021-06-28 DIAGNOSIS — D649 Anemia, unspecified: Secondary | ICD-10-CM | POA: Diagnosis not present

## 2021-06-28 DIAGNOSIS — M79644 Pain in right finger(s): Secondary | ICD-10-CM | POA: Diagnosis not present

## 2021-06-30 DIAGNOSIS — Z9181 History of falling: Secondary | ICD-10-CM | POA: Diagnosis not present

## 2021-06-30 DIAGNOSIS — R2681 Unsteadiness on feet: Secondary | ICD-10-CM | POA: Diagnosis not present

## 2021-07-06 DIAGNOSIS — E860 Dehydration: Secondary | ICD-10-CM | POA: Diagnosis not present

## 2021-07-06 DIAGNOSIS — D631 Anemia in chronic kidney disease: Secondary | ICD-10-CM | POA: Diagnosis not present

## 2021-07-06 DIAGNOSIS — R001 Bradycardia, unspecified: Secondary | ICD-10-CM | POA: Diagnosis not present

## 2021-07-06 DIAGNOSIS — I700 Atherosclerosis of aorta: Secondary | ICD-10-CM | POA: Diagnosis not present

## 2021-07-06 DIAGNOSIS — Z9181 History of falling: Secondary | ICD-10-CM | POA: Diagnosis not present

## 2021-07-06 DIAGNOSIS — N179 Acute kidney failure, unspecified: Secondary | ICD-10-CM | POA: Diagnosis not present

## 2021-07-06 DIAGNOSIS — N184 Chronic kidney disease, stage 4 (severe): Secondary | ICD-10-CM | POA: Diagnosis not present

## 2021-07-06 DIAGNOSIS — M150 Primary generalized (osteo)arthritis: Secondary | ICD-10-CM | POA: Diagnosis not present

## 2021-07-06 DIAGNOSIS — E785 Hyperlipidemia, unspecified: Secondary | ICD-10-CM | POA: Diagnosis not present

## 2021-07-06 DIAGNOSIS — I129 Hypertensive chronic kidney disease with stage 1 through stage 4 chronic kidney disease, or unspecified chronic kidney disease: Secondary | ICD-10-CM | POA: Diagnosis not present

## 2021-07-06 DIAGNOSIS — F0390 Unspecified dementia without behavioral disturbance: Secondary | ICD-10-CM | POA: Diagnosis not present

## 2021-07-13 DIAGNOSIS — Z9181 History of falling: Secondary | ICD-10-CM | POA: Diagnosis not present

## 2021-07-13 DIAGNOSIS — I129 Hypertensive chronic kidney disease with stage 1 through stage 4 chronic kidney disease, or unspecified chronic kidney disease: Secondary | ICD-10-CM | POA: Diagnosis not present

## 2021-07-13 DIAGNOSIS — E860 Dehydration: Secondary | ICD-10-CM | POA: Diagnosis not present

## 2021-07-13 DIAGNOSIS — E785 Hyperlipidemia, unspecified: Secondary | ICD-10-CM | POA: Diagnosis not present

## 2021-07-13 DIAGNOSIS — F0390 Unspecified dementia without behavioral disturbance: Secondary | ICD-10-CM | POA: Diagnosis not present

## 2021-07-13 DIAGNOSIS — N179 Acute kidney failure, unspecified: Secondary | ICD-10-CM | POA: Diagnosis not present

## 2021-07-13 DIAGNOSIS — D631 Anemia in chronic kidney disease: Secondary | ICD-10-CM | POA: Diagnosis not present

## 2021-07-13 DIAGNOSIS — R001 Bradycardia, unspecified: Secondary | ICD-10-CM | POA: Diagnosis not present

## 2021-07-13 DIAGNOSIS — I700 Atherosclerosis of aorta: Secondary | ICD-10-CM | POA: Diagnosis not present

## 2021-07-13 DIAGNOSIS — N184 Chronic kidney disease, stage 4 (severe): Secondary | ICD-10-CM | POA: Diagnosis not present

## 2021-07-13 DIAGNOSIS — M150 Primary generalized (osteo)arthritis: Secondary | ICD-10-CM | POA: Diagnosis not present

## 2021-07-22 DIAGNOSIS — D631 Anemia in chronic kidney disease: Secondary | ICD-10-CM | POA: Diagnosis not present

## 2021-07-22 DIAGNOSIS — R001 Bradycardia, unspecified: Secondary | ICD-10-CM | POA: Diagnosis not present

## 2021-07-22 DIAGNOSIS — Z9181 History of falling: Secondary | ICD-10-CM | POA: Diagnosis not present

## 2021-07-22 DIAGNOSIS — E785 Hyperlipidemia, unspecified: Secondary | ICD-10-CM | POA: Diagnosis not present

## 2021-07-22 DIAGNOSIS — E860 Dehydration: Secondary | ICD-10-CM | POA: Diagnosis not present

## 2021-07-22 DIAGNOSIS — N184 Chronic kidney disease, stage 4 (severe): Secondary | ICD-10-CM | POA: Diagnosis not present

## 2021-07-22 DIAGNOSIS — I129 Hypertensive chronic kidney disease with stage 1 through stage 4 chronic kidney disease, or unspecified chronic kidney disease: Secondary | ICD-10-CM | POA: Diagnosis not present

## 2021-07-22 DIAGNOSIS — F0390 Unspecified dementia without behavioral disturbance: Secondary | ICD-10-CM | POA: Diagnosis not present

## 2021-07-22 DIAGNOSIS — M150 Primary generalized (osteo)arthritis: Secondary | ICD-10-CM | POA: Diagnosis not present

## 2021-07-22 DIAGNOSIS — N179 Acute kidney failure, unspecified: Secondary | ICD-10-CM | POA: Diagnosis not present

## 2021-07-22 DIAGNOSIS — I700 Atherosclerosis of aorta: Secondary | ICD-10-CM | POA: Diagnosis not present

## 2021-07-26 DIAGNOSIS — Z7189 Other specified counseling: Secondary | ICD-10-CM | POA: Diagnosis not present

## 2021-07-26 DIAGNOSIS — D649 Anemia, unspecified: Secondary | ICD-10-CM | POA: Diagnosis not present

## 2021-07-26 DIAGNOSIS — I129 Hypertensive chronic kidney disease with stage 1 through stage 4 chronic kidney disease, or unspecified chronic kidney disease: Secondary | ICD-10-CM | POA: Diagnosis not present

## 2021-07-26 DIAGNOSIS — N1832 Chronic kidney disease, stage 3b: Secondary | ICD-10-CM | POA: Diagnosis not present

## 2021-07-26 DIAGNOSIS — F03B0 Unspecified dementia, moderate, without behavioral disturbance, psychotic disturbance, mood disturbance, and anxiety: Secondary | ICD-10-CM | POA: Diagnosis not present

## 2021-08-01 ENCOUNTER — Other Ambulatory Visit (HOSPITAL_COMMUNITY): Payer: Self-pay | Admitting: *Deleted

## 2021-08-02 ENCOUNTER — Ambulatory Visit (HOSPITAL_COMMUNITY)
Admission: RE | Admit: 2021-08-02 | Discharge: 2021-08-02 | Disposition: A | Discharge status: Home / Self Care | Payer: PPO | Source: Ambulatory Visit | Attending: Internal Medicine | Admitting: Internal Medicine

## 2021-08-02 DIAGNOSIS — L03012 Cellulitis of left finger: Secondary | ICD-10-CM | POA: Diagnosis not present

## 2021-08-02 DIAGNOSIS — M7989 Other specified soft tissue disorders: Secondary | ICD-10-CM | POA: Diagnosis not present

## 2021-08-02 DIAGNOSIS — D649 Anemia, unspecified: Secondary | ICD-10-CM | POA: Diagnosis not present

## 2021-08-02 LAB — PREPARE RBC (CROSSMATCH)

## 2021-08-02 MED ORDER — FUROSEMIDE 10 MG/ML IJ SOLN
INTRAMUSCULAR | Status: AC
  Administered 2021-08-02: 20 mg via INTRAVENOUS
  Filled 2021-08-02: qty 2

## 2021-08-02 MED ORDER — SODIUM CHLORIDE 0.9% IV SOLUTION: Freq: Once | INTRAVENOUS | Status: DC

## 2021-08-02 MED ORDER — FUROSEMIDE 10 MG/ML IJ SOLN: 20.0000 mg | Freq: Once | INTRAMUSCULAR | Status: AC

## 2021-08-03 ENCOUNTER — Telehealth: Payer: Self-pay

## 2021-08-03 LAB — TYPE AND SCREEN
ABO/RH(D): B POS
Antibody Screen: NEGATIVE
Unit division: 0

## 2021-08-03 LAB — BPAM RBC
Blood Product Expiration Date: 202305122359
ISSUE DATE / TIME: 202304250921
Unit Type and Rh: 7300

## 2021-08-03 NOTE — Telephone Encounter (Signed)
Spoke with patient's brother Ludwig Clarks and scheduled a telephonic Palliative Consult for 08/08/21 @ 3 PM.  ? ?Consent obtained; updated Netsmart, Team List and Epic.  ? ?

## 2021-08-04 DIAGNOSIS — M79642 Pain in left hand: Secondary | ICD-10-CM | POA: Diagnosis not present

## 2021-08-08 ENCOUNTER — Other Ambulatory Visit: Payer: PPO | Admitting: Family Medicine

## 2021-08-08 ENCOUNTER — Telehealth: Payer: Self-pay | Admitting: Family Medicine

## 2021-08-08 NOTE — Progress Notes (Deleted)
Therapist, nutritional Palliative Care Consult Note Telephone: [phone]  Fax: [phone]   Date of encounter: 08/08/21 3:07 PM PATIENT NAME: [name] [address] [place] [address]   [phone] (home)  DOB: 29-Dec-1932 MRN: [id number] PRIMARY CARE PROVIDER:    Geoffry Paradise, MD,  [address] [place] [address] [phone]  REFERRING PROVIDER:   Geoffry Paradise, MD [address] [place],  [place] [address] [phone]  RESPONSIBLE PARTY:    Contact Information     Name Relation Home Work Mobile   Holt Niece [phone]  [phone]   Cheatum,Eddie Brother [phone]     Eliseo Squires Niece   [phone]   Jenella, [place]   [phone]        I met face to face with patient and family in *** home/facility. Palliative Care was asked to follow this patient by consultation request of  Geoffry Paradise, MD to address advance care planning and complex medical decision making. This is the initial visit.          ASSESSMENT, SYMPTOM MANAGEMENT AND PLAN / RECOMMENDATIONS:     Follow up Palliative Care Visit: Palliative care will continue to follow for complex medical decision making, advance care planning, and clarification of goals. Return *** weeks or prn.    This visit was coded based on medical decision making (MDM).***  PPS: ***0%  HOSPICE ELIGIBILITY/DIAGNOSIS: TBD  Chief Complaint: ***  HISTORY OF PRESENT ILLNESS:  [name] is a 88 y.o. year old female  with *** .  Lives by herself but brother lives a few minutes away and checks in and often brings her to his home for the afternoon. She stays by herself at night.  Had a fall and went to [facility] and HGB at that time was 6.  Gave her some blood.  Had some irregularity in her heart.  Took her off her original Alzheimer's meds and started Memantine.  Went to [facility] for rehab.  She had home nursing for 3-4 weeks and MSW.  Her appetite has  "doubled" since being put on new "memory pill".  Dont know where she was losing blood from.  She is on iron.  He wants to keep her at home as long as she is not in danger. Nephew is Sheretta Surrett is durable POA.    History obtained from review of EMR, discussion with primary team, and interview with family, facility staff/caregiver and/or Ms. Nunes.  I reviewed available labs, medications, imaging, studies and related documents from the EMR.  Records reviewed and summarized above.   ROS General: NAD EYES: denies vision changes ENMT: denies dysphagia Cardiovascular: denies chest pain, denies DOE Pulmonary: denies cough, denies increased SOB Abdomen: endorses good appetite, denies constipation, endorses continence of bowel GU: denies dysuria, endorses continence of urine MSK:  denies increased weakness, no falls reported Skin: denies rashes or wounds Neurological: denies pain, denies insomnia Psych: Endorses positive mood Heme/lymph/immuno: denies bruises, abnormal bleeding  Physical Exam: Current and past weights: Constitutional: NAD General: frail appearing, thin/WNWD/obese  EYES: anicteric sclera, lids intact, no discharge  ENMT: intact hearing, oral mucous membranes moist, dentition intact CV: S1S2, RRR, no LE edema Pulmonary: CTAB, no increased work of breathing, no cough, room air Abdomen: intake 100%, normo-active BS + 4 quadrants, soft and non tender, no ascites GU: deferred MSK: no sarcopenia, moves all extremities, ambulatory Skin: warm and dry, no rashes or wounds on visible skin Neuro:  no generalized weakness, no cognitive impairment Psych:  non-anxious affect, A and O x 3 Hem/lymph/immuno: no widespread bruising  CURRENT PROBLEM LIST:  Patient Active Problem List   Diagnosis Date Noted   Unwitnessed fall 06/03/2021   Symptomatic anemia 05/31/2021   Dementia without behavioral disturbance (HCC) 05/31/2021   Fall at home, initial encounter 05/31/2021   Acute renal  failure superimposed on stage 4 chronic kidney disease (HCC) 05/31/2021   Genetic testing 09/05/2017   Family history of colon cancer    Family history of melanoma    Family history of leukemia    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    Bradycardia 10/18/2014   AKI (acute kidney injury) (HCC) 10/17/2014   Essential hypertension 10/17/2014   Syncope and collapse 10/16/2014   Endometrial cancer determined by uterine biopsy (HCC) 01/02/2014   Constipation, chronic 06/13/2011   H/O diverticulitis of colon 06/13/2011   Gastritis 06/05/2011   Family history of malignant neoplasm of gastrointestinal tract 05/25/2011   Esophageal reflux 05/25/2011   Diverticulosis of colon (without mention of hemorrhage) 04/26/2011   Abdominal pain 04/26/2011   Constipation, outlet dysfunction 04/26/2011   PAST MEDICAL HISTORY:  Active Ambulatory Problems    Diagnosis Date Noted   Diverticulosis of colon (without mention of hemorrhage) 04/26/2011   Abdominal pain 04/26/2011   Constipation, outlet dysfunction 04/26/2011   Family history of malignant neoplasm of gastrointestinal tract 05/25/2011   Esophageal reflux 05/25/2011   Gastritis 06/05/2011   Constipation, chronic 06/13/2011   H/O diverticulitis of colon 06/13/2011   Endometrial cancer determined by uterine biopsy (HCC) 01/02/2014   Syncope and collapse 10/16/2014   AKI (acute kidney injury) (HCC) 10/17/2014   Essential hypertension 10/17/2014   Bradycardia 10/18/2014   Family history of colon cancer    Family history of melanoma    Family history of leukemia    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    Genetic testing 09/05/2017   Symptomatic anemia 05/31/2021   Dementia without behavioral disturbance (HCC) 05/31/2021   Fall at home, initial encounter 05/31/2021   Acute renal failure superimposed on stage 4 chronic kidney disease (HCC) 05/31/2021   Unwitnessed fall  06/03/2021   Resolved Ambulatory Problems    Diagnosis Date Noted   Endometrial cancer (HCC) 02/17/2014   Past Medical History:  Diagnosis Date   Arthritis    Baker cyst    Basal cell carcinoma of nose    CAD (coronary artery disease)    Colitis, ischemic (HCC)    Diverticulosis of colon    Esophageal stricture    Familial tremor    Gastritis and duodenitis    Genetic defect    History of gallstones    History of kidney stones    Hypercholesterolemia    Obesity    TIA (transient ischemic attack)    Varicose veins    SOCIAL HX:  Social History   Tobacco Use   Smoking status: Never   Smokeless tobacco: Never  Substance Use Topics   Alcohol use: No   FAMILY HX:  Family History  Problem Relation Age of Onset   Heart disease Sister    Diabetes Brother        x 4   Colon cancer Sister        dx in her 91s   Diabetes Sister    Crohn's disease Other        Nephew   Colon cancer Maternal Aunt    Colon cancer Maternal Uncle    Heart disease Brother  Colon polyps Other        Nephew   Leukemia Sister    Diabetes Sister    Melanoma Other 21       niece   Lung cancer Other    Leukemia Other        great niece       Preferred Pharmacy: ALLERGIES:  Allergies  Allergen Reactions   Benadryl [Diphenhydramine Hcl (Sleep)] Other (See Comments)    Light headed, heart racing.    Ciprofloxacin Other (See Comments)    Leg pains   Pravastatin Other (See Comments)    Leg cramps   Vioxx [Rofecoxib] Nausea Only   Cephalosporins Rash     PERTINENT MEDICATIONS:  Outpatient Encounter Medications as of 08/08/2021  Medication Sig   diclofenac Sodium (VOLTAREN) 1 % GEL Apply 2 g topically 2 (two) times daily.   donepezil (ARICEPT) 10 MG tablet Take 10 mg by mouth at bedtime.   feeding supplement (ENSURE ENLIVE / ENSURE PLUS) LIQD Take 237 mLs by mouth 2 (two) times daily between meals.   ferrous sulfate 325 (65 FE) MG tablet Take 1 tablet (325 mg total) by mouth 3 (three)  times daily with meals.   folic acid (FOLVITE) 1 MG tablet Take 1 tablet (1 mg total) by mouth daily.   Multiple Vitamin (MULTIVITAMIN WITH MINERALS) TABS tablet Take 1 tablet by mouth daily.   pantoprazole (PROTONIX) 40 MG tablet Take 1 tablet (40 mg total) by mouth daily.   [DISCONTINUED] esomeprazole (NEXIUM) 40 MG capsule Take 1 capsule (40 mg total) by mouth daily.   [DISCONTINUED] Linaclotide (LINZESS) 145 MCG CAPS Take 1 capsule by mouth daily.   No facility-administered encounter medications on file as of 08/08/2021.     -------------------------------------------------------------------------------------------------------------------------------------------------------------------------------------------------------------------------------------------- Advance Care Planning/Goals of Care: Goals include to maximize quality of life and symptom management. Patient/health care surrogate gave his/her permission to discuss.Our advance care planning conversation included a discussion about:    The value and importance of advance care planning  Experiences with loved ones who have been seriously ill or have died  Exploration of personal, cultural or spiritual beliefs that might influence medical decisions  Exploration of goals of care in the event of a sudden injury or illness  Identification  of a healthcare agent  Review and updating or creation of an  advance directive document . Decision not to resuscitate or to de-escalate disease focused treatments due to poor prognosis. CODE STATUS:  I spent *** minutes providing this consultation providing Palliative Care counseling on goals of care. More than 50% of the time in this consultation was spent in counseling and care coordination.   Thank you for the opportunity to participate in the care of Ms. Barman.  The palliative care team will continue to follow. Please call our office at [phone] if we can be of additional assistance.   Lurline Idol, FNP-C  COVID-19 PATIENT SCREENING TOOL Asked and negative response unless otherwise noted:  Have you had symptoms of covid, tested positive or been in contact with someone with symptoms/positive test in the past 5-10 days? NO

## 2021-08-08 NOTE — Telephone Encounter (Signed)
TCT brother, had trouble with hearing him on Webex. Was able to call and start visit via laptop webex and then computer died.  Called back and scheduled in person visit for 10 am tomorrow. ?Able to get the following information from pt's brother today: ?Brother, Ludwig Clarks is the primary caregiver.  Their nephew Aoife Bold is POA (he is unsure if this is durable or HC POA or both).  Lives by herself but brother lives a few minutes away and checks in and often brings her to his home for the afternoon. She stays by herself at night.  Had a fall and went to [facility] and HGB at that time was 6.  Gave her some blood.  Had some irregularity in her heart.  Took her off her original Alzheimer's meds and started Memantine.  Went to [facility] for rehab.  She had home nursing for 3-4 weeks and MSW.  Her appetite has "doubled" since being put on new "memory pill".  Dont know where she was losing blood from.  She is on iron.  He wants to keep her at home as long as she is not in danger.  Damaris Hippo FNP-C ?

## 2021-08-09 ENCOUNTER — Other Ambulatory Visit: Payer: Self-pay | Admitting: Family Medicine

## 2021-08-09 ENCOUNTER — Encounter: Payer: Self-pay | Admitting: Family Medicine

## 2021-08-09 VITALS — BP 130/70 | HR 68 | Resp 18

## 2021-08-09 DIAGNOSIS — N184 Chronic kidney disease, stage 4 (severe): Secondary | ICD-10-CM

## 2021-08-09 DIAGNOSIS — F0390 Unspecified dementia without behavioral disturbance: Secondary | ICD-10-CM

## 2021-08-09 DIAGNOSIS — E440 Moderate protein-calorie malnutrition: Secondary | ICD-10-CM | POA: Diagnosis not present

## 2021-08-09 DIAGNOSIS — D649 Anemia, unspecified: Secondary | ICD-10-CM | POA: Diagnosis not present

## 2021-08-09 HISTORY — DX: Chronic kidney disease, stage 4 (severe): N18.4

## 2021-08-09 MED ORDER — PANTOPRAZOLE SODIUM 40 MG PO TBEC: 40.0000 mg | DELAYED_RELEASE_TABLET | Freq: Two times a day (BID) | ORAL | 0 refills | Status: AC

## 2021-08-09 NOTE — Progress Notes (Signed)
? ? ?Manufacturing engineer ?Community Palliative Care Consult Note ?Telephone: [phone]  ?Fax: [phone]  ? ?Date of encounter: 08/09/21 ?10:21 AM ?PATIENT NAME: [name] ?[address] ?[place] [address]   ?[phone] (home)  ?DOB: 1932-07-08 ?MRN: [id number] ?PRIMARY CARE PROVIDER:    ?Kaitlyn Bunting, MD,  ?[address] ?[place] [address] ?[phone] ? ?REFERRING PROVIDER:   ?Kaitlyn Bunting, MD ?[address] ?[place],  [place] [address] ?[phone] ? ?RESPONSIBLE PARTY:    ?Contact Information   ? ? Name Relation Home Work Mobile  ? [name] Niece [phone]  [phone]  ? [name] Brother [phone]    ? [name] Niece   [phone]  ? [name] Nephew   [phone]  ? ?  ? ? ? ?I met face to face with patient, her brother and his son (not [name]) in patient's home. Palliative Care was asked to follow this patient by consultation request of  Kaitlyn Bunting, MD to address advance care planning and complex medical decision making. This is the initial visit.  ? ? ?      ASSESSMENT, SYMPTOM MANAGEMENT AND PLAN / RECOMMENDATIONS:  ?Symptomatic anemia ?Question based on anemia and thrombocytopenia + genetics if pt may have MDS. ?Likely multifactorial including CKD and nutritional deficiency.  ?Would not recommend aggressive testing for etiology given advanced age and comorbid conditions with family's wishes. ?Continue FeSO4, folic acid and Vitamin B12 supplementation. ?Treat symptomatically. ? ?Moderate protein calorie malnutrition ?Encourage small frequent meals as possible and possible renally based nutritional supplement for protein. ?Last Albumin 2.7 in 05/2021 ? ?Dementia without behavioral disturbance ?FAST 7 score 6e-Explained normal progression with use of PPS to determine when pt appropriate for referral to Hospice level of care to family. ?Continue memantine. ? ?4.   CKD stage 4 ?Continue to encourage fluid intake, periodic monitoring of  electrolytes K+, Phos and Mg  ? ?Follow up Palliative Care Visit: Palliative care will continue to follow for complex medical decision making, advance care planning, and clarification of goals. Return 4 weeks or prn. ? ? ? ?This visit was coded based on medical decision making (MDM). ? ?PPS: 60% ? ?HOSPICE ELIGIBILITY/DIAGNOSIS: TBD ? ?Chief Complaint:  ?[facility] received a referral to follow up with patient for chronic disease management in setting of dementia. ? ?HISTORY OF PRESENT ILLNESS:  [name] is a 88 y.o. year old female who has dementia and lives alone with support of her brother [name] who lives nearby as well as several nieces and nephews.  Eddie picks her up every day at 2 pm to take her to his home to watch old westerns.  She has 80% hearing loss, has a hx of endometrial cancer s/p hysterectomy/BSO with no further cancer.  She has severe arthritis of hands and feet, has flares and remissions, has seen a specialist but does not have a conclusive diagnosis of RA.  She has a hx of ischemic colitis, HTN, esophageal stricture, TIA and varicosities. Family hx is complicated by multiple different cancers including melanoma, colon cancer and leukemia. Currently she has significant recent anemia with syncope and nadir of HGB 6.8 on 06/01/21 and was transfused with max HGB 9.3 and subsequent decline in 8 range.  Per brother, pt underwent stool testing which was negative for blood.  Source of loss unknown, suspected to be "from her heart". She was last transfused with 1 unit PRBC on 08/02/21.  She was sent to [facility] after hospitalization in February and brother states she begged  to come home "and if I am going to die, let me die in my own home."  He states she refused to eat and since she has been back home she is eating double what she normally would. During February her Cr was 1.8-2.0 with eGFR 27.  She has had CR in this range 6 years ago with eGFR in 30s. Suspect she has  CKD stage 3b or 4 at baseline. Albumin was 2.7 in February 2023. 2d echo done 06/01/21 with EF 60-65%, no wall motion abnormality, LVH or diastolic dysfunction and no significant valvular disease. Brother comes, brings her breakfast every morning and checks in on her.  In Feb 2023 she had a fall, was taken in to the ER found to be bradycardic and severely anemic.  Cardiology was consulted and indicated she was not a candidate given her comorbid conditions for a pacemaker, took her off Aricept (thinking this was etiology of bradycardia) and she was started on Memantine. CT head and facial series negative for bleeding, mass or acute fx on 05/31/21. 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  Recommendation in 08/2017 was work up for AML or MDS. Anemia panel of 05/31/21 showed low iron 26, TIBC of 228 with normal ferritin 295 and low normal Vitamin B12 of 357. ? ?History obtained from review of EMR, discussion with family,  and/or [name].  ?I reviewed available labs, medications, imaging, studies and related documents from the EMR.  Records reviewed and summarized above.  ? ?ROS (limited with input from family due to pt's dementia) ?General: NAD ?EENT:  Lost 80% of her hearing ?ENMT: denies dysphagia ?Cardiovascular: denies chest pain, denies DOE ?Pulmonary: denies cough, denies increased SOB ?Abdomen: endorses good appetite, denies constipation, endorses incontinence of bowel ?GU: denies dysuria, endorses incontinence of urine but brother states she knows when she goes and changes herself but hygiene is questionable ?MSK:  denies increased weakness, no recent falls reported ?Skin: denies rashes or wounds ?Neurological: denies pain, denies insomnia ?Psych: Endorses positive mood ?Heme/lymph/immuno: denies bruises blood counts stable following transfusion for long periods of time ? ?Physical Exam: ?Current and past weights: 101 lbs 6.6 ounces as of  05/31/21 ?Constitutional: NAD ?General: frail appearing, thin  ?EYES: anicteric sclera, lids intact, no discharge  ?ENMT: hard of hearing, oral mucous membranes moist, dentition intact ?CV: S1S2, RRR, no LE edema ?Pulmonary: CTAB, no increased work of breathing, no cough, room air ?Abdomen: normo-active BS + 4 quadrants, soft and non tender, no ascites ?GU: deferred ?MSK: no sarcopenia, moves all extremities, ambulatory has swan neck deformities of DIP of both hands, worst on left hand with increased erythema/edema and decreased flexion of left hand and fingers ?Skin: warm and dry, no rashes or wounds on visible skin ?Neuro:  no generalized weakness, noted severe short term memory loss ?Psych: non-anxious affect, A and O x 2 ?Hem/lymph/immuno: no widespread bruising ? ?CURRENT PROBLEM LIST:  ?Patient Active Problem List  ? Diagnosis Date Noted  ? Unwitnessed fall 06/03/2021  ? Symptomatic anemia 05/31/2021  ? Dementia without behavioral disturbance ([place]) 05/31/2021  ? Fall at home, initial encounter 05/31/2021  ? Acute renal failure superimposed on stage 4 chronic kidney disease ([place]) 05/31/2021  ? Genetic testing 09/05/2017  ? Family history of colon cancer   ? Family history of melanoma   ? Family history of leukemia   ? 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  Bradycardia 10/18/2014  ? AKI (acute kidney injury) ([place]) 10/17/2014  ? Essential hypertension 10/17/2014  ? Syncope and collapse 10/16/2014  ? Endometrial cancer determined by uterine biopsy ([place]) 01/02/2014  ? Constipation, chronic 06/13/2011  ? H/O diverticulitis of colon 06/13/2011  ? Gastritis 06/05/2011  ? Family history of malignant neoplasm of gastrointestinal tract 05/25/2011  ? Esophageal reflux 05/25/2011  ? Diverticulosis of colon (without mention of hemorrhage) 04/26/2011  ? Abdominal pain 04/26/2011  ? Constipation, outlet dysfunction 04/26/2011  ? ?PAST MEDICAL HISTORY:  ?Active Ambulatory Problems  ?   Diagnosis Date Noted  ? Diverticulosis of colon (without mention of hemorrhage) 04/26/2011  ? Abdominal pain 04/26/2011  ? Constipation, outlet dysfunction 04/26/2011  ? Family history of malignant neoplasm of

## 2021-08-13 ENCOUNTER — Encounter: Payer: Self-pay | Admitting: Family Medicine

## 2021-08-13 NOTE — Progress Notes (Signed)
Opened in error.  Pt was seen for home visit on 08/09/21 ?

## 2021-08-18 ENCOUNTER — Ambulatory Visit: Payer: PPO | Admitting: Podiatry

## 2021-08-18 DIAGNOSIS — M79675 Pain in left toe(s): Secondary | ICD-10-CM | POA: Diagnosis not present

## 2021-08-18 DIAGNOSIS — B351 Tinea unguium: Secondary | ICD-10-CM

## 2021-08-18 DIAGNOSIS — M79674 Pain in right toe(s): Secondary | ICD-10-CM | POA: Diagnosis not present

## 2021-08-18 DIAGNOSIS — L84 Corns and callosities: Secondary | ICD-10-CM | POA: Diagnosis not present

## 2021-08-20 NOTE — Progress Notes (Signed)
Subjective:  ? ?Patient ID: [name], female   DOB: 88 y.o.   MRN: [id number]  ? ?HPI ? ?88 year old female presents the office today for concerns of thick, elongated nails that she not able to trim her self as well as for calluses on the left foot.  She denies any open lesions or any swelling or redness or any drainage.  She has no other concerns today. ? ?Review of Systems  ?All other systems reviewed and are negative. ? ?Past Medical History:  ?Diagnosis Date  ? Arthritis   ? Baker cyst   ? a. 04/2014 on right.  ? Basal cell carcinoma of nose   ? CAD (coronary artery disease)   ? Based on CT imaging 12/2013  ? CKD (chronic kidney disease) stage 4, GFR 15-29 ml/min (HCC) 08/09/2021  ? Colitis, ischemic ([place])   ? Diverticulosis of colon   ? Endometrial cancer ([place]) 12/2013  ? Esophageal stricture   ? Essential hypertension   ? Familial tremor   ? Gastritis and duodenitis   ? Genetic defect   ? see genetic counselor notes  ? 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 History of gallstones   ? History of kidney stones   ? Hypercholesterolemia   ? Obesity   ? TIA (transient ischemic attack)   ? Varicose veins   ? ? ?Past Surgical History:  ?Procedure Laterality Date  ? CATARACT EXTRACTION Bilateral 2005  ? CATARACT EXTRACTION W/ INTRAOCULAR LENS  IMPLANT, BILATERAL    ? CHOLECYSTECTOMY    ? CHOLECYSTECTOMY, LAPAROSCOPIC  2002  ? COLONOSCOPY    ? EYE SURGERY    ? HYSTEROSCOPY WITH D & C N/A 12/18/2013  ? Procedure: DILATATION AND CURETTAGE /HYSTEROSCOPY;  Surgeon: Gus Height, MD;  Location: [facility];  Service: Gynecology;  Laterality: N/A;  ? Deputy SURGERY Right 1998  ? L1-L2  ? LYMPH NODE DISSECTION N/A 02/17/2014  ? Procedure: LYMPH NODE DISSECTION;  Surgeon: Imagene Gurney A. Alycia Rossetti, MD;  Location: [facility];  Service: Gynecology;  Laterality: N/A;  ? ROBOTIC ASSISTED TOTAL HYSTERECTOMY WITH BILATERAL SALPINGO OOPHERECTOMY N/A 02/17/2014  ? with Lymph node dissection ; Surgeon: Imagene Gurney A.  Alycia Rossetti, MD;  Location: [facility];  Service: Gynecology;  Laterality: N/A;  ? ROTATOR CUFF REPAIR Bilateral 2003  ? SKIN BIOPSY    ? Four times last 2005 - nose cancer  ? TENDON GRAFT  2003  ? Bone graft  ? [place]  ? WISDOM TOOTH EXTRACTION    ? ? ? ?Current Outpatient Medications:  ?  diclofenac Sodium (VOLTAREN) 1 % GEL, Apply 2 g topically 2 (two) times daily., Disp: 50 g, Rfl: 0 ?  feeding supplement (ENSURE ENLIVE / ENSURE PLUS) LIQD, Take 237 mLs by mouth 2 (two) times daily between meals., Disp: 237 mL, Rfl: 12 ?  ferrous sulfate 325 (65 FE) MG tablet, Take 1 tablet (325 mg total) by mouth 3 (three) times daily with meals., Disp: 120 tablet, Rfl: 0 ?  folic acid (FOLVITE) 1 MG tablet, Take 1 tablet (1 mg total) by mouth daily., Disp: 30 tablet, Rfl: 0 ?  memantine (NAMENDA) 10 MG tablet, Take 10 mg by mouth 2 (two) times daily., Disp: , Rfl:  ?  Multiple Vitamin (MULTIVITAMIN WITH MINERALS) TABS tablet, Take 1 tablet by mouth daily., Disp: 150 tablet, Rfl: 0 ?  pantoprazole (PROTONIX) 40 MG tablet, Take 1 tablet (40 mg total) by mouth 2 (two) times daily.,  Disp: 30 tablet, Rfl: 0 ? ?Allergies  ?Allergen Reactions  ? Benadryl [Diphenhydramine Hcl (Sleep)] Other (See Comments)  ?  Light headed, heart racing.   ? Ciprofloxacin Other (See Comments)  ?  Leg pains  ? Pravastatin Other (See Comments)  ?  Leg cramps  ? Vioxx [Rofecoxib] Nausea Only  ? Cephalosporins Rash  ? ? ? ? ? ?   ?Objective:  ?Physical Exam  ?General: NAD-presents with brother ? ?Dermatological: Nails are hypertrophic, dystrophic, brittle, discolored, elongated ?10. No surrounding redness or drainage. Tenderness nails 1-5 bilaterally.  Hyperkeratotic lesions noted to left submetatarsal 1 as well as the left arch of the foot.  There is no underlying ulceration drainage or signs of infection.  No other open lesions are noted.   ? ?Vascular: Dorsalis Pedis artery and Posterior Tibial artery pedal pulses are palpable  bilateral with immedate capillary fill time. There is no pain with calf compression, swelling, warmth, erythema.  ? ?Neruologic: Grossly intact via light touch bilateral.  ? ?Musculoskeletal: No other areas of discomfort noted today. ? ? ?   ?Assessment:  ? ?Symptomatic onychomycosis, hyperkeratotic lesions ? ?   ?Plan:  ?-Treatment options discussed including all alternatives, risks, and complications ?-Etiology of symptoms were discussed ?-Nails debrided ?10 without complications or bleeding. ?-Sharply reviewed the hyperkeratotic lesion x2 without any complications or bleeding.  Recommend moisturizer daily. ?-Daily foot inspection ?-Follow-up in 3 months or sooner if any problems arise. In the meantime, encouraged to call the office with any questions, concerns, change in symptoms.  ? ?Celesta Gentile, DPM ? ?   ? ?

## 2021-09-27 ENCOUNTER — Emergency Department (HOSPITAL_COMMUNITY): Payer: PPO

## 2021-09-27 ENCOUNTER — Encounter (HOSPITAL_COMMUNITY): Payer: Self-pay

## 2021-09-27 ENCOUNTER — Emergency Department (HOSPITAL_COMMUNITY)
Admission: EM | Admit: 2021-09-27 | Discharge: 2021-09-28 | Disposition: A | Discharge status: Home / Self Care | Payer: PPO | Attending: Emergency Medicine | Admitting: Emergency Medicine

## 2021-09-27 DIAGNOSIS — F0390 Unspecified dementia without behavioral disturbance: Secondary | ICD-10-CM | POA: Diagnosis not present

## 2021-09-27 DIAGNOSIS — D631 Anemia in chronic kidney disease: Secondary | ICD-10-CM | POA: Diagnosis not present

## 2021-09-27 DIAGNOSIS — S0990XA Unspecified injury of head, initial encounter: Secondary | ICD-10-CM | POA: Diagnosis not present

## 2021-09-27 DIAGNOSIS — W010XXA Fall on same level from slipping, tripping and stumbling without subsequent striking against object, initial encounter: Secondary | ICD-10-CM | POA: Insufficient documentation

## 2021-09-27 DIAGNOSIS — G319 Degenerative disease of nervous system, unspecified: Secondary | ICD-10-CM | POA: Diagnosis not present

## 2021-09-27 DIAGNOSIS — N184 Chronic kidney disease, stage 4 (severe): Secondary | ICD-10-CM | POA: Insufficient documentation

## 2021-09-27 DIAGNOSIS — R55 Syncope and collapse: Secondary | ICD-10-CM | POA: Diagnosis not present

## 2021-09-27 DIAGNOSIS — D649 Anemia, unspecified: Secondary | ICD-10-CM

## 2021-09-27 DIAGNOSIS — G8911 Acute pain due to trauma: Secondary | ICD-10-CM | POA: Diagnosis not present

## 2021-09-27 DIAGNOSIS — W19XXXA Unspecified fall, initial encounter: Secondary | ICD-10-CM | POA: Diagnosis not present

## 2021-09-27 DIAGNOSIS — R531 Weakness: Secondary | ICD-10-CM

## 2021-09-27 DIAGNOSIS — I6529 Occlusion and stenosis of unspecified carotid artery: Secondary | ICD-10-CM | POA: Diagnosis not present

## 2021-09-27 LAB — CBC
HCT: 23.8 % — ABNORMAL LOW (ref 36.0–46.0)
Hemoglobin: 7.5 g/dL — ABNORMAL LOW (ref 12.0–15.0)
MCH: 29.4 pg (ref 26.0–34.0)
MCHC: 31.5 g/dL (ref 30.0–36.0)
MCV: 93.3 fL (ref 80.0–100.0)
Platelets: 166 10*3/uL (ref 150–400)
RBC: 2.55 MIL/uL — ABNORMAL LOW (ref 3.87–5.11)
RDW: 19.4 % — ABNORMAL HIGH (ref 11.5–15.5)
WBC: 4.5 10*3/uL (ref 4.0–10.5)
nRBC: 0 % (ref 0.0–0.2)

## 2021-09-27 LAB — URINALYSIS, ROUTINE W REFLEX MICROSCOPIC
Bilirubin Urine: NEGATIVE
Glucose, UA: NEGATIVE mg/dL
Ketones, ur: NEGATIVE mg/dL
Leukocytes,Ua: NEGATIVE
Nitrite: NEGATIVE
Protein, ur: NEGATIVE mg/dL
Specific Gravity, Urine: 1.014 (ref 1.005–1.030)
pH: 5 (ref 5.0–8.0)

## 2021-09-27 LAB — COMPREHENSIVE METABOLIC PANEL
ALT: 15 U/L (ref 0–44)
AST: 31 U/L (ref 15–41)
Albumin: 3.5 g/dL (ref 3.5–5.0)
Alkaline Phosphatase: 55 U/L (ref 38–126)
Anion gap: 7 (ref 5–15)
BUN: 44 mg/dL — ABNORMAL HIGH (ref 8–23)
CO2: 20 mmol/L — ABNORMAL LOW (ref 22–32)
Calcium: 8.7 mg/dL — ABNORMAL LOW (ref 8.9–10.3)
Chloride: 114 mmol/L — ABNORMAL HIGH (ref 98–111)
Creatinine, Ser: 1.95 mg/dL — ABNORMAL HIGH (ref 0.44–1.00)
GFR, Estimated: 25 mL/min — ABNORMAL LOW (ref >60)
Glucose, Bld: 94 mg/dL (ref 70–99)
Potassium: 4.4 mmol/L (ref 3.5–5.1)
Sodium: 141 mmol/L (ref 135–145)
Total Bilirubin: 0.8 mg/dL (ref 0.3–1.2)
Total Protein: 6.9 g/dL (ref 6.5–8.1)

## 2021-09-27 LAB — CK: Total CK: 579 U/L — ABNORMAL HIGH (ref 38–234)

## 2021-09-27 LAB — IRON AND TIBC
Iron: 35 ug/dL (ref 28–170)
Saturation Ratios: 17 % (ref 10.4–31.8)
TIBC: 206 ug/dL — ABNORMAL LOW (ref 250–450)
UIBC: 171 ug/dL

## 2021-09-27 LAB — RETICULOCYTES
Immature Retic Fract: 12.3 % (ref 2.3–15.9)
RBC.: 2.34 MIL/uL — ABNORMAL LOW (ref 3.87–5.11)
Retic Count, Absolute: 18.3 10*3/uL — ABNORMAL LOW (ref 19.0–186.0)
Retic Ct Pct: 0.8 % (ref 0.4–3.1)

## 2021-09-27 LAB — FOLATE: Folate: 36.9 ng/mL (ref >5.9)

## 2021-09-27 LAB — FERRITIN: Ferritin: 758 ng/mL — ABNORMAL HIGH (ref 11–307)

## 2021-09-27 LAB — VITAMIN B12: Vitamin B-12: 256 pg/mL (ref 180–914)

## 2021-09-27 LAB — POC OCCULT BLOOD, ED: Fecal Occult Bld: NEGATIVE

## 2021-09-27 LAB — CBG MONITORING, ED: Glucose-Capillary: 86 mg/dL (ref 70–99)

## 2021-09-27 NOTE — ED Provider Notes (Signed)
[facility] DEPT Provider Note   CSN: [id number] Arrival date & time: 09/27/21  1107     History  Chief Complaint  Patient presents with   Lytle Michaels    [name] is a 88 y.o. female.  Patient presents  via EMS, was found by family on floor at home. Pt denies falling or pain. Pt with hx dementia - poor historian - level 5 caveat. Pt denies headache. No chest pain or sob. No abd pain or nvd. No dysuria or gu c/o. Pt denies extremity pain or injury.   The history is provided by the patient, the EMS personnel and medical records. The history is limited by the condition of the patient.  Fall Pertinent negatives include no chest pain, no abdominal pain, no headaches and no shortness of breath.       Home Medications Prior to Admission medications   Medication Sig Start Date End Date Taking? Authorizing Provider  diclofenac Sodium (VOLTAREN) 1 % GEL Apply 2 g topically 2 (two) times daily. 06/06/21   Lavina Hamman, MD  feeding supplement (ENSURE ENLIVE / ENSURE PLUS) LIQD Take 237 mLs by mouth 2 (two) times daily between meals. 06/06/21   Lavina Hamman, MD  ferrous sulfate 325 (65 FE) MG tablet Take 1 tablet (325 mg total) by mouth 3 (three) times daily with meals. 06/06/21   Lavina Hamman, MD  folic acid (FOLVITE) 1 MG tablet Take 1 tablet (1 mg total) by mouth daily. 06/07/21   Lavina Hamman, MD  memantine (NAMENDA) 10 MG tablet Take 10 mg by mouth 2 (two) times daily.      Multiple Vitamin (MULTIVITAMIN WITH MINERALS) TABS tablet Take 1 tablet by mouth daily. 06/07/21   Lavina Hamman, MD  pantoprazole (PROTONIX) 40 MG tablet Take 1 tablet (40 mg total) by mouth 2 (two) times daily. 08/09/21   Burnard Bunting, MD  esomeprazole (NEXIUM) 40 MG capsule Take 1 capsule (40 mg total) by mouth daily. 06/13/11 06/23/11  Sable Feil, MD  Linaclotide Rolan Lipa) 145 MCG CAPS Take 1 capsule by mouth daily. 06/13/11 06/23/11  Sable Feil, MD       Allergies    Benadryl [diphenhydramine hcl (sleep)], Ciprofloxacin, Pravastatin, Vioxx [rofecoxib], and Cephalosporins    Review of Systems   Review of Systems  Constitutional:  Negative for fever.  HENT:  Negative for nosebleeds and sore throat.   Eyes:  Negative for pain and visual disturbance.  Respiratory:  Negative for cough and shortness of breath.   Cardiovascular:  Negative for chest pain.  Gastrointestinal:  Negative for abdominal pain, diarrhea and vomiting.  Genitourinary:  Negative for dysuria and flank pain.  Musculoskeletal:  Negative for back pain and neck pain.  Skin:  Negative for rash.  Neurological:  Negative for speech difficulty, weakness, numbness and headaches.  Hematological:  Does not bruise/bleed easily.  Psychiatric/Behavioral:  Negative for agitation.     Physical Exam Updated Vital Signs BP (!) 96/49   Pulse 66   Temp 98.9 F (37.2 C) (Oral)   Resp (!) 22   SpO2 99%  Physical Exam Vitals and nursing note reviewed.  Constitutional:      Appearance: Normal appearance. She is well-developed.  HENT:     Head: Atraumatic.     Nose: Nose normal.     Mouth/Throat:     Mouth: Mucous membranes are moist.  Eyes:     General: No scleral icterus.    Conjunctiva/sclera:  Conjunctivae normal.     Pupils: Pupils are equal, round, and reactive to light.  Neck:     Vascular: No carotid bruit.     Trachea: No tracheal deviation.  Cardiovascular:     Rate and Rhythm: Normal rate and regular rhythm.     Pulses: Normal pulses.     Heart sounds: Normal heart sounds. No murmur heard.    No friction rub. No gallop.  Pulmonary:     Effort: Pulmonary effort is normal. No respiratory distress.     Breath sounds: Normal breath sounds.  Abdominal:     General: Bowel sounds are normal. There is no distension.     Palpations: Abdomen is soft.     Tenderness: There is no abdominal tenderness. There is no guarding.  Genitourinary:    Comments: No cva tenderness.  Very dark colored stool (pt is on Fe therapy) - tests heme neg.  Musculoskeletal:        General: No swelling.     Cervical back: Normal range of motion and neck supple. No rigidity or tenderness. No muscular tenderness.     Comments: CTLS spine, non tender, aligned, no step off. Good rom bil extremities without pain or focal bony tenderness.   Skin:    General: Skin is warm and dry.     Findings: No rash.  Neurological:     Mental Status: She is alert.     Comments: Alert, speech normal. Motor/sens grossly intact bil.   Psychiatric:        Mood and Affect: Mood normal.     ED Results / Procedures / Treatments   Labs (all labs ordered are listed, but only abnormal results are displayed) Results for orders placed or performed during the hospital encounter of 09/27/21  Comprehensive metabolic panel  Result Value Ref Range   Sodium 141 135 - 145 mmol/L   Potassium 4.4 3.5 - 5.1 mmol/L   Chloride 114 (H) 98 - 111 mmol/L   CO2 20 (L) 22 - 32 mmol/L   Glucose, Bld 94 70 - 99 mg/dL   BUN 44 (H) 8 - 23 mg/dL   Creatinine, Ser 1.95 (H) 0.44 - 1.00 mg/dL   Calcium 8.7 (L) 8.9 - 10.3 mg/dL   Total Protein 6.9 6.5 - 8.1 g/dL   Albumin 3.5 3.5 - 5.0 g/dL   AST 31 15 - 41 U/L   ALT 15 0 - 44 U/L   Alkaline Phosphatase 55 38 - 126 U/L   Total Bilirubin 0.8 0.3 - 1.2 mg/dL   GFR, Estimated 25 (L) >60 mL/min   Anion gap 7 5 - 15  CBC  Result Value Ref Range   WBC 4.5 4.0 - 10.5 K/uL   RBC 2.55 (L) 3.87 - 5.11 MIL/uL   Hemoglobin 7.5 (L) 12.0 - 15.0 g/dL   HCT 23.8 (L) 36.0 - 46.0 %   MCV 93.3 80.0 - 100.0 fL   MCH 29.4 26.0 - 34.0 pg   MCHC 31.5 30.0 - 36.0 g/dL   RDW 19.4 (H) 11.5 - 15.5 %   Platelets 166 150 - 400 K/uL   nRBC 0.0 0.0 - 0.2 %  CK  Result Value Ref Range   Total CK 579 (H) 38 - 234 U/L  Reticulocytes  Result Value Ref Range   Retic Ct Pct 0.8 0.4 - 3.1 %   RBC. 2.34 (L) 3.87 - 5.11 MIL/uL   Retic Count, Absolute 18.3 (L) 19.0 - 186.0 K/uL   Immature  Retic  Fract 12.3 2.3 - 15.9 %  POC occult blood, ED Provider will collect  Result Value Ref Range   Fecal Occult Bld NEGATIVE NEGATIVE     EKG EKG Interpretation  Date/Time:  Tuesday September 27 2021 14:36:09 EDT Ventricular Rate:  61 PR Interval:    QRS Duration: 86 QT Interval:  444 QTC Calculation: 448 R Axis:   54 Text Interpretation: Sinus arrhythmia Premature atrial complexes Nonspecific T wave abnormality Confirmed by Lajean Saver [phone]) on 09/27/2021 3:12:54 PM  Radiology CT Head Wo Contrast  Result Date: 09/27/2021 CLINICAL DATA:  Found down at home. EXAM: CT HEAD WITHOUT CONTRAST TECHNIQUE: Contiguous axial images were obtained from the base of the skull through the vertex without intravenous contrast. RADIATION DOSE REDUCTION: This exam was performed according to the departmental dose-optimization program which includes automated exposure control, adjustment of the mA and/or kV according to patient size and/or use of iterative reconstruction technique. COMPARISON:  CT head dated May 31, 2021. FINDINGS: Brain: No evidence of acute infarction, hemorrhage, hydrocephalus, extra-axial collection or mass lesion/mass effect. Stable atrophy and chronic microvascular ischemic changes. Vascular: Atherosclerotic vascular calcification of the carotid siphons. No hyperdense vessel. Skull: Normal. Negative for fracture or focal lesion. Sinuses/Orbits: No acute finding. Chronic ethmoid air cell mucosal thickening and partial opacification of the left sphenoid sinus. Other: None. IMPRESSION: 1. No acute intracranial abnormality. Electronically Signed   By: Titus Dubin M.D.   On: 09/27/2021 13:17    Procedures Procedures    Medications Ordered in ED Medications - No data to display  ED Course/ Medical Decision Making/ A&P                           Medical Decision Making Problems Addressed: Chronic anemia: chronic illness or injury with exacerbation, progression, or side effects of  treatment that poses a threat to life or bodily functions CKD (chronic kidney disease) stage 4, GFR 15-29 ml/min ([place]): chronic illness or injury with exacerbation, progression, or side effects of treatment that poses a threat to life or bodily functions Fall from slip, trip, or stumble, initial encounter: acute illness or injury with systemic symptoms that poses a threat to life or bodily functions Generalized weakness: acute illness or injury with systemic symptoms that poses a threat to life or bodily functions  Amount and/or Complexity of Data Reviewed Independent Historian: EMS    Details: hx External Data Reviewed: notes. Labs: ordered. Decision-making details documented in ED Course. Radiology: ordered and independent interpretation performed. Decision-making details documented in ED Course. ECG/medicine tests: ordered and independent interpretation performed. Decision-making details documented in ED Course.  Risk OTC drugs. Decision regarding hospitalization.  Iv ns. Continuous pulse ox and cardiac monitoring. Labs ordered/sent.   Differential dx includes uti, tbi, severe anemia, metabolic abn, etc - dispo decision including possible need for admission considered - will get labs and imaging and reassess.   Reviewed nursing notes and prior charts for additional history. External reports reviewed. Additional history from: EMS.   Cardiac monitor: sinus rhythm, rate 66.  Labs reviewed/interpreted by me - wbc normal. Hgb low, hx chronic anemia. Stool heme neg.   CT reviewed/interpreted by me - no hem.   Recheck pt, no new c/o, denies pain. Appears happy/smiling, content.   UA is pending. 1545 pt signed out to Dr Fulton Reek to check pending labs and dispo appropriately.  Final Clinical Impression(s) / ED Diagnoses Final diagnoses:  None    Rx / DC Orders ED Discharge Orders     None         Lajean Saver, MD 09/27/21 1558

## 2021-09-27 NOTE — ED Notes (Signed)
Attempted to collect urine specimen, pt. Had BM @ the same time. Unable to collect specimen, purewick currently secured to Pt. Will continue plan  of care.

## 2021-09-27 NOTE — ED Triage Notes (Signed)
PTAR states Robbye Dede [phone] is pt POA

## 2021-09-27 NOTE — ED Triage Notes (Signed)
Pt arrived via [facility], from home, found on the ground at home by family, unknown down time. Pt lives home alone. Hx of dementia.

## 2021-09-27 NOTE — ED Provider Notes (Signed)
Patient signed out to me by previous provider. Please refer to their note for full HPI.  Briefly this is an 88 year old female who was found on the floor of her home today.  Patient with history of dementia, poor historian secondary to that but otherwise noted to be baseline.  Denies any acute complaints.  Work-up thus far has been baseline (anemia and CKD) and reassuring, we are pending urinalysis. Physical Exam  BP (!) 103/55   Pulse (!) 53   Temp 98.9 F (37.2 C) (Oral)   Resp 12   SpO2 99%   Physical Exam Vitals and nursing note reviewed.  Constitutional:      General: She is not in acute distress.    Appearance: Normal appearance. She is not ill-appearing.  HENT:     Head: Normocephalic.     Mouth/Throat:     Mouth: Mucous membranes are moist.  Cardiovascular:     Rate and Rhythm: Normal rate.  Pulmonary:     Effort: Pulmonary effort is normal.  Abdominal:     Palpations: Abdomen is soft.     Tenderness: There is no abdominal tenderness.  Musculoskeletal:     Cervical back: No rigidity.  Skin:    General: Skin is warm.  Neurological:     Mental Status: She is alert. Mental status is at baseline.     Procedures  Procedures  ED Course / MDM    Medical Decision Making Amount and/or Complexity of Data Reviewed Labs: ordered. ECG/medicine tests: ordered.   Urinalysis shows no signs of infection.  VS normal, no other complaints from patient. Family has been contacted.  We have recommended follow-up with the primary doctor for further evaluation of outpatient care/services and possible placement if needed.  Plan for discharge home with family support.  Patient at this time appears safe and stable for discharge and close outpatient follow up. Discharge plan and strict return to ED precautions discussed, patient verbalizes understanding and agreement.       Lorelle Gibbs, DO 09/27/21 2147

## 2021-09-27 NOTE — ED Notes (Signed)
This RN spoke with Gaspar Bidding, pt's nephew, about concern for pt going back home alone. Pt is extremely confused. Disoriented times four but alert. Pt cannot repeat her name when asked and starts speaking of different subjects. At times, pt has word salad. Bryan reassured this RN that she has a brother and family that are always checking in on her. This RN offered a transition of care consult and reassured family to think about it. As of now, family will be present at 0830 to pick pt up and bring her back home.

## 2021-09-27 NOTE — ED Notes (Signed)
Blood draw unsuccessful at this time.

## 2021-09-27 NOTE — Discharge Instructions (Addendum)
You have been seen and discharged from the emergency department.  Follow-up with your primary provider for further evaluation and further care.  Follow-up with the primary provider for evaluation of possible home assistance/placement if needed.  Take home medications as prescribed. If you have any worsening symptoms or further concerns for your health please return to an emergency department for further evaluation.

## 2021-11-02 ENCOUNTER — Emergency Department (HOSPITAL_COMMUNITY)

## 2021-11-02 ENCOUNTER — Emergency Department (HOSPITAL_COMMUNITY)
Admission: EM | Admit: 2021-11-02 | Discharge: 2021-11-02 | Disposition: A | Discharge status: Home / Self Care | Attending: Emergency Medicine | Admitting: Emergency Medicine

## 2021-11-02 ENCOUNTER — Encounter (HOSPITAL_COMMUNITY): Payer: Self-pay

## 2021-11-02 DIAGNOSIS — W19XXXA Unspecified fall, initial encounter: Secondary | ICD-10-CM | POA: Diagnosis not present

## 2021-11-02 DIAGNOSIS — F0390 Unspecified dementia without behavioral disturbance: Secondary | ICD-10-CM | POA: Insufficient documentation

## 2021-11-02 DIAGNOSIS — S93402A Sprain of unspecified ligament of left ankle, initial encounter: Secondary | ICD-10-CM

## 2021-11-02 DIAGNOSIS — Y92009 Unspecified place in unspecified non-institutional (private) residence as the place of occurrence of the external cause: Secondary | ICD-10-CM | POA: Insufficient documentation

## 2021-11-02 DIAGNOSIS — M25572 Pain in left ankle and joints of left foot: Secondary | ICD-10-CM | POA: Insufficient documentation

## 2021-11-02 DIAGNOSIS — S99819A Other specified injuries of unspecified ankle, initial encounter: Secondary | ICD-10-CM | POA: Diagnosis not present

## 2021-11-02 MED ORDER — HALOPERIDOL 1 MG PO TABS
1.0000 mg | ORAL_TABLET | Freq: Once | ORAL | Status: AC
  Administered 2021-11-02: 1 mg via ORAL
  Filled 2021-11-02: qty 1

## 2021-11-02 NOTE — Progress Notes (Signed)
[facility] Liaison Note:   [name] is a current hospice patient in the home with Manufacturing [facility]. An [place] will follow patient during this hospitalization as needed.   Please do not hesitate to reach out to an [place] with any hospice related  questions.     Gar Ponto, RN  RN PRN/Hospital Liaison  [phone]

## 2021-11-02 NOTE — ED Provider Triage Note (Addendum)
Emergency Medicine Provider Triage Evaluation Note  [name] , a 88 y.o. female  was evaluated in triage.  Patient presents for evaluation of left ankle injury.  Family is not at bedside.  Patient is a poor historian.  Per report family does have access to video of the incident.  They report no head injury.  Unsure if this was purely mechanical.  Family does plan to come to the emergency room.  Review of Systems  Positive: As above Negative: As above  Physical Exam  BP (!) 118/52 (BP Location: Left Arm)   Pulse 66   Temp 99.7 F (37.6 C) (Oral)   Resp 16   SpO2 100%  Gen:   Awake, no distress   Resp:  Normal effort  MSK:   Moves extremities without difficulty  Other:  Left ankle with tenderness and swelling.  Medical Decision Making  Medically screening exam initiated at 11:37 AM.  Appropriate orders placed.  [name] was informed that the remainder of the evaluation will be completed by another provider, this initial triage assessment does not replace that evaluation, and the importance of remaining in the ED until their evaluation is complete.  1400: Family member is at bedside.  Discussion had with family member.  She does not have access to the video of the fall.  She called the nephew who does.  Nephew states patient had mechanical fall and slowly dropped herself to the ground.  No head injury.  No loss of consciousness.  Patient stays by herself.  Family is concerned regarding return to home in her current living situation and would like to inquire about SNF placement.   Evlyn Courier, PA-C 11/02/21 [place], [place], PA-C 11/02/21 1452

## 2021-11-02 NOTE — ED Provider Notes (Signed)
[facility] DEPT Provider Note   CSN: [id number] Arrival date & time: 11/02/21  1111     History  Chief Complaint  Patient presents with   Lytle Michaels    NEVA RAMASWAMY is a 88 y.o. female.   Fall  Patient presents after fall at home.  Left ankle pain.  History of dementia.  Also is on hospice but has not actively at end-of-life.  Only complaining of some ankle pain.  Patient's niece is here and states that the patient's son was hoping for rehab placement.  Patient's niece states she thinks she may need skilled nursing placement.     Home Medications Prior to Admission medications   Medication Sig Start Date End Date Taking? Authorizing Provider  diclofenac Sodium (VOLTAREN) 1 % GEL Apply 2 g topically 2 (two) times daily. 06/06/21   Lavina Hamman, MD  feeding supplement (ENSURE ENLIVE / ENSURE PLUS) LIQD Take 237 mLs by mouth 2 (two) times daily between meals. 06/06/21   Lavina Hamman, MD  ferrous sulfate 325 (65 FE) MG tablet Take 1 tablet (325 mg total) by mouth 3 (three) times daily with meals. 06/06/21   Lavina Hamman, MD  folic acid (FOLVITE) 1 MG tablet Take 1 tablet (1 mg total) by mouth daily. 06/07/21   Lavina Hamman, MD  memantine (NAMENDA) 10 MG tablet Take 10 mg by mouth 2 (two) times daily.      Multiple Vitamin (MULTIVITAMIN WITH MINERALS) TABS tablet Take 1 tablet by mouth daily. 06/07/21   Lavina Hamman, MD  pantoprazole (PROTONIX) 40 MG tablet Take 1 tablet (40 mg total) by mouth 2 (two) times daily. 08/09/21   Burnard Bunting, MD  esomeprazole (NEXIUM) 40 MG capsule Take 1 capsule (40 mg total) by mouth daily. 06/13/11 06/23/11  Sable Feil, MD  Linaclotide Rolan Lipa) 145 MCG CAPS Take 1 capsule by mouth daily. 06/13/11 06/23/11  Sable Feil, MD      Allergies    Benadryl [diphenhydramine hcl (sleep)], Ciprofloxacin, Pravastatin, Vioxx [rofecoxib], and Cephalosporins    Review of Systems   Review of Systems  Physical  Exam Updated Vital Signs BP (!) 118/52 (BP Location: Left Arm)   Pulse 66   Temp 99.7 F (37.6 C) (Oral)   Resp 16   SpO2 100%  Physical Exam Vitals and nursing note reviewed.  HENT:     Head: Atraumatic.  Cardiovascular:     Rate and Rhythm: Regular rhythm.  Chest:     Chest wall: No tenderness.  Abdominal:     Tenderness: There is no abdominal tenderness.  Musculoskeletal:        General: No tenderness.     Comments: Tenderness and swelling to right ankle, particular medially.  Skin intact.  Sensation intact distally.  No tenderness over knee or hip.    Skin:    General: Skin is warm.  Neurological:     Mental Status: She is alert.     ED Results / Procedures / Treatments   Labs (all labs ordered are listed, but only abnormal results are displayed) Labs Reviewed - No data to display  EKG None  Radiology DG Ankle Complete Left  Result Date: 11/02/2021 CLINICAL DATA:  Left ankle pain and swelling. EXAM: LEFT ANKLE COMPLETE - 3+ VIEW COMPARISON:  None Available. FINDINGS: There is no evidence of fracture, dislocation, or joint effusion. There is flatfoot deformity. There are arthritic changes of the talonavicular and subtalar joints. Prominent plantar calcaneal  spurring. Soft tissue swelling about the lateral malleolus. IMPRESSION: 1.  No evidence of acute fracture or dislocation. 2.  Generalized arthritic changes with flatfoot deformity. 3. Soft tissue swelling about the lateral malleolus concerning for ligamentous sprain/injury. Electronically Signed   By: Keane Police D.O.   On: 11/02/2021 12:02    Procedures Procedures    Medications Ordered in ED Medications - No data to display  ED Course/ Medical Decision Making/ A&P                           Medical Decision Making Risk Prescription drug management.   Patient with mechanical fall.  Ankle pain.  Has fallen this area before.  Family thinks she may have been reaching to close the blinds at her house.  X-ray  shows only some edema.  We will give cam walker.  Patient ambulates with either walker or cane at baseline.  Patient is on hospice but not actively at end-of-life.  Patient's family was hoping for a placement of some kind.  Discussed with hospice, who states they cannot do the placement this time and family is welcome to get placement and they will follow with the nursing home.  Also attempted to contact patient's PCP but it is after hours now.  Patient has become a little more agitated.  Discussed with niece.  Given some oral Haldol.  We both think it is not worth waiting longer the ER to attempt further placement or evaluation.  Can follow-up with PCP and they can help arrange follow-up.  Also hospice may be able to help.  Will discharge home.        Final Clinical Impression(s) / ED Diagnoses Final diagnoses:  None    Rx / DC Orders ED Discharge Orders     None         Davonna Belling, MD 11/02/21 2358

## 2021-11-02 NOTE — Discharge Instructions (Signed)
Call Dr. Joya Salm about potentially going to a higher level of care.  He also may help with some medication adjustment, or the hospice doctors may also be able to help

## 2021-11-02 NOTE — ED Notes (Signed)
An After Visit Summary was printed and given to the patient. Discharge instructions given and no further questions at this time. Pt leaving with niece.

## 2021-11-02 NOTE — ED Triage Notes (Signed)
Pt arrived via EMS, from home, unwitnessed fall. Family reviewed video, states no head strike. Swelling to left ankle.  Aox2 baseline. Hx of dementia

## 2021-11-21 ENCOUNTER — Ambulatory Visit: Payer: PPO | Admitting: Podiatry

## 2021-12-09 DEATH — deceased
